# Patient Record
Sex: Male | Born: 1975 | Race: White | Hispanic: No | Marital: Single | Smoking: Never smoker
Health system: Southern US, Community
[De-identification: ages and names within clinical notes are randomized; demographics above are authoritative.]

## PROBLEM LIST (undated history)

## (undated) DIAGNOSIS — F419 Anxiety disorder, unspecified: Secondary | ICD-10-CM

## (undated) DIAGNOSIS — Z9889 Other specified postprocedural states: Secondary | ICD-10-CM

## (undated) DIAGNOSIS — I10 Essential (primary) hypertension: Secondary | ICD-10-CM

## (undated) DIAGNOSIS — M549 Dorsalgia, unspecified: Secondary | ICD-10-CM

## (undated) DIAGNOSIS — E119 Type 2 diabetes mellitus without complications: Secondary | ICD-10-CM

## (undated) DIAGNOSIS — G8929 Other chronic pain: Secondary | ICD-10-CM

## (undated) HISTORY — DX: Other chronic pain: G89.29

## (undated) HISTORY — PX: OTHER SURGICAL HISTORY: SHX169

## (undated) HISTORY — DX: Anxiety disorder, unspecified: F41.9

## (undated) HISTORY — DX: Dorsalgia, unspecified: M54.9

---

## 1998-05-19 ENCOUNTER — Ambulatory Visit (HOSPITAL_COMMUNITY): Admission: RE | Admit: 1998-05-19 | Discharge: 1998-05-19 | Payer: Self-pay | Admitting: Family Medicine

## 1999-09-18 ENCOUNTER — Emergency Department (HOSPITAL_COMMUNITY): Admission: EM | Admit: 1999-09-18 | Discharge: 1999-09-18 | Payer: Self-pay | Admitting: Emergency Medicine

## 1999-09-18 ENCOUNTER — Encounter: Payer: Self-pay | Admitting: Emergency Medicine

## 2002-02-14 ENCOUNTER — Emergency Department (HOSPITAL_COMMUNITY): Admission: EM | Admit: 2002-02-14 | Discharge: 2002-02-15 | Payer: Self-pay

## 2003-11-30 ENCOUNTER — Emergency Department (HOSPITAL_COMMUNITY): Admission: EM | Admit: 2003-11-30 | Discharge: 2003-11-30 | Payer: Self-pay | Admitting: Emergency Medicine

## 2004-06-07 ENCOUNTER — Emergency Department (HOSPITAL_COMMUNITY): Admission: EM | Admit: 2004-06-07 | Discharge: 2004-06-07 | Payer: Self-pay | Admitting: Emergency Medicine

## 2005-01-07 ENCOUNTER — Emergency Department (HOSPITAL_COMMUNITY): Admission: EM | Admit: 2005-01-07 | Discharge: 2005-01-08 | Payer: Self-pay | Admitting: Emergency Medicine

## 2005-01-09 ENCOUNTER — Inpatient Hospital Stay (HOSPITAL_COMMUNITY): Admission: EM | Admit: 2005-01-09 | Discharge: 2005-01-11 | Payer: Self-pay | Admitting: Emergency Medicine

## 2005-01-09 ENCOUNTER — Ambulatory Visit: Payer: Self-pay | Admitting: Internal Medicine

## 2005-01-26 ENCOUNTER — Ambulatory Visit: Payer: Self-pay | Admitting: Internal Medicine

## 2005-11-20 ENCOUNTER — Emergency Department (HOSPITAL_COMMUNITY): Admission: EM | Admit: 2005-11-20 | Discharge: 2005-11-20 | Payer: Self-pay | Admitting: Emergency Medicine

## 2006-06-27 ENCOUNTER — Emergency Department (HOSPITAL_COMMUNITY): Admission: EM | Admit: 2006-06-27 | Discharge: 2006-06-28 | Payer: Self-pay | Admitting: Emergency Medicine

## 2006-09-17 ENCOUNTER — Ambulatory Visit: Payer: Self-pay | Admitting: Internal Medicine

## 2006-09-17 LAB — CONVERTED CEMR LAB
AST: 29 units/L (ref 0–37)
Albumin: 3.9 g/dL (ref 3.5–5.2)
Basophils Relative: 0.9 % (ref 0.0–1.0)
CO2: 26 meq/L (ref 19–32)
Calcium: 9 mg/dL (ref 8.4–10.5)
Cholesterol: 180 mg/dL (ref 0–200)
Eosinophil percent: 3 % (ref 0.0–5.0)
GFR calc non Af Amer: 104 mL/min
Glomerular Filtration Rate, Af Am: 126 mL/min/{1.73_m2}
Glucose, Bld: 100 mg/dL — ABNORMAL HIGH (ref 70–99)
HCT: 48.2 % (ref 39.0–52.0)
HDL: 28.8 mg/dL — ABNORMAL LOW (ref >39.0)
Hemoglobin: 16.7 g/dL (ref 13.0–17.0)
LDL DIRECT: 110.7 mg/dL
Leukocytes, UA: NEGATIVE
Lymphocytes Relative: 34.3 % (ref 12.0–46.0)
Monocytes Absolute: 0.6 10*3/uL (ref 0.2–0.7)
Neutrophils Relative %: 53.8 % (ref 43.0–77.0)
Potassium: 3.9 meq/L (ref 3.5–5.1)
RDW: 11.7 % (ref 11.5–14.6)
Sodium: 141 meq/L (ref 135–145)
Specific Gravity, Urine: >=1.03 (ref 1.000–1.03)
TSH: 0.81 microintl units/mL (ref 0.35–5.50)
Total Bilirubin: 0.8 mg/dL (ref 0.3–1.2)
Total Protein, Urine: NEGATIVE mg/dL
Urine Glucose: NEGATIVE mg/dL
Urobilinogen, UA: 0.2 (ref 0.0–1.0)
VLDL: 43 mg/dL — ABNORMAL HIGH (ref 0–40)
pH: 5.5 (ref 5.0–8.0)

## 2006-09-23 ENCOUNTER — Emergency Department (HOSPITAL_COMMUNITY): Admission: EM | Admit: 2006-09-23 | Discharge: 2006-09-23 | Payer: Self-pay | Admitting: Emergency Medicine

## 2006-09-24 ENCOUNTER — Ambulatory Visit: Payer: Self-pay | Admitting: Internal Medicine

## 2007-05-09 ENCOUNTER — Ambulatory Visit: Payer: Self-pay | Admitting: Internal Medicine

## 2007-05-29 ENCOUNTER — Ambulatory Visit: Payer: Self-pay | Admitting: Internal Medicine

## 2007-09-21 ENCOUNTER — Encounter: Payer: Self-pay | Admitting: Internal Medicine

## 2007-09-26 ENCOUNTER — Telehealth: Payer: Self-pay | Admitting: Internal Medicine

## 2007-12-24 ENCOUNTER — Ambulatory Visit: Payer: Self-pay | Admitting: Internal Medicine

## 2007-12-24 DIAGNOSIS — G8929 Other chronic pain: Secondary | ICD-10-CM | POA: Insufficient documentation

## 2007-12-24 DIAGNOSIS — M549 Dorsalgia, unspecified: Secondary | ICD-10-CM

## 2008-01-01 ENCOUNTER — Telehealth: Payer: Self-pay | Admitting: Internal Medicine

## 2008-03-25 ENCOUNTER — Ambulatory Visit: Payer: Self-pay | Admitting: Internal Medicine

## 2008-03-25 LAB — CONVERTED CEMR LAB
ALT: 60 units/L — ABNORMAL HIGH (ref 0–53)
AST: 42 units/L — ABNORMAL HIGH (ref 0–37)
Basophils Absolute: 0.1 10*3/uL (ref 0.0–0.1)
Basophils Relative: 0.7 % (ref 0.0–1.0)
Bilirubin, Direct: 0.1 mg/dL (ref 0.0–0.3)
Chloride: 111 meq/L (ref 96–112)
Creatinine, Ser: 0.8 mg/dL (ref 0.4–1.5)
Eosinophils Absolute: 0.2 10*3/uL (ref 0.0–0.7)
Glucose, Bld: 119 mg/dL — ABNORMAL HIGH (ref 70–99)
HDL: 29.4 mg/dL — ABNORMAL LOW (ref >39.0)
Hemoglobin, Urine: NEGATIVE
Hemoglobin: 16.1 g/dL (ref 13.0–17.0)
MCV: 88.9 fL (ref 78.0–100.0)
Monocytes Relative: 8.4 % (ref 3.0–12.0)
Neutro Abs: 4.5 10*3/uL (ref 1.4–7.7)
Potassium: 4 meq/L (ref 3.5–5.1)
RBC: 5.14 M/uL (ref 4.22–5.81)
RDW: 12 % (ref 11.5–14.6)
Sodium: 142 meq/L (ref 135–145)
Specific Gravity, Urine: >=1.03 (ref 1.000–1.03)
Total Bilirubin: 0.7 mg/dL (ref 0.3–1.2)
Total CHOL/HDL Ratio: 7.1
Total Protein, Urine: NEGATIVE mg/dL
Total Protein: 7.2 g/dL (ref 6.0–8.3)
Urine Glucose: NEGATIVE mg/dL
VLDL: 43 mg/dL — ABNORMAL HIGH (ref 0–40)
WBC: 8 10*3/uL (ref 4.5–10.5)
pH: 5 (ref 5.0–8.0)

## 2008-04-02 ENCOUNTER — Ambulatory Visit: Payer: Self-pay | Admitting: Internal Medicine

## 2008-04-02 DIAGNOSIS — Z8669 Personal history of other diseases of the nervous system and sense organs: Secondary | ICD-10-CM | POA: Insufficient documentation

## 2008-04-02 DIAGNOSIS — M79609 Pain in unspecified limb: Secondary | ICD-10-CM | POA: Insufficient documentation

## 2008-04-02 DIAGNOSIS — E785 Hyperlipidemia, unspecified: Secondary | ICD-10-CM | POA: Insufficient documentation

## 2008-04-02 DIAGNOSIS — I10 Essential (primary) hypertension: Secondary | ICD-10-CM | POA: Insufficient documentation

## 2008-04-02 DIAGNOSIS — E669 Obesity, unspecified: Secondary | ICD-10-CM | POA: Insufficient documentation

## 2008-04-02 DIAGNOSIS — F17200 Nicotine dependence, unspecified, uncomplicated: Secondary | ICD-10-CM | POA: Insufficient documentation

## 2008-04-29 ENCOUNTER — Ambulatory Visit: Payer: Self-pay | Admitting: Internal Medicine

## 2008-04-29 LAB — CONVERTED CEMR LAB
BUN: 11 mg/dL (ref 6–23)
CO2: 30 meq/L (ref 19–32)
Glucose, Bld: 154 mg/dL — ABNORMAL HIGH (ref 70–99)
Potassium: 3.9 meq/L (ref 3.5–5.1)
Total CHOL/HDL Ratio: 5.8

## 2008-05-04 ENCOUNTER — Telehealth: Payer: Self-pay | Admitting: Internal Medicine

## 2008-05-04 ENCOUNTER — Encounter: Payer: Self-pay | Admitting: Internal Medicine

## 2008-07-31 ENCOUNTER — Telehealth: Payer: Self-pay | Admitting: Internal Medicine

## 2008-08-03 ENCOUNTER — Telehealth: Payer: Self-pay | Admitting: Internal Medicine

## 2008-09-24 ENCOUNTER — Emergency Department (HOSPITAL_COMMUNITY): Admission: EM | Admit: 2008-09-24 | Discharge: 2008-09-25 | Payer: Self-pay | Admitting: Emergency Medicine

## 2008-12-03 ENCOUNTER — Telehealth: Payer: Self-pay | Admitting: Internal Medicine

## 2009-02-03 ENCOUNTER — Telehealth: Payer: Self-pay | Admitting: Internal Medicine

## 2009-04-02 ENCOUNTER — Telehealth: Payer: Self-pay | Admitting: Internal Medicine

## 2009-08-02 ENCOUNTER — Telehealth: Payer: Self-pay | Admitting: Internal Medicine

## 2009-08-03 ENCOUNTER — Telehealth (INDEPENDENT_AMBULATORY_CARE_PROVIDER_SITE_OTHER): Payer: Self-pay | Admitting: *Deleted

## 2009-08-17 ENCOUNTER — Telehealth (INDEPENDENT_AMBULATORY_CARE_PROVIDER_SITE_OTHER): Payer: Self-pay | Admitting: *Deleted

## 2009-08-20 ENCOUNTER — Ambulatory Visit: Payer: Self-pay | Admitting: Internal Medicine

## 2009-08-20 DIAGNOSIS — IMO0002 Reserved for concepts with insufficient information to code with codable children: Secondary | ICD-10-CM | POA: Insufficient documentation

## 2009-08-20 DIAGNOSIS — E1149 Type 2 diabetes mellitus with other diabetic neurological complication: Secondary | ICD-10-CM

## 2009-08-20 DIAGNOSIS — E1165 Type 2 diabetes mellitus with hyperglycemia: Secondary | ICD-10-CM | POA: Insufficient documentation

## 2009-08-20 DIAGNOSIS — E118 Type 2 diabetes mellitus with unspecified complications: Secondary | ICD-10-CM | POA: Insufficient documentation

## 2009-08-20 LAB — CONVERTED CEMR LAB
BUN: 11 mg/dL (ref 6–23)
CO2: 28 meq/L (ref 19–32)
Calcium: 9.6 mg/dL (ref 8.4–10.5)
GFR calc non Af Amer: 116.5 mL/min (ref >60)
Glucose, Bld: 180 mg/dL — ABNORMAL HIGH (ref 70–99)

## 2009-09-30 ENCOUNTER — Telehealth: Payer: Self-pay | Admitting: Internal Medicine

## 2009-12-29 ENCOUNTER — Telehealth: Payer: Self-pay | Admitting: Internal Medicine

## 2009-12-30 ENCOUNTER — Telehealth: Payer: Self-pay | Admitting: Internal Medicine

## 2010-01-04 ENCOUNTER — Ambulatory Visit: Payer: Self-pay | Admitting: Internal Medicine

## 2010-01-04 LAB — CONVERTED CEMR LAB: Blood Glucose, Fingerstick: 234

## 2010-02-16 ENCOUNTER — Telehealth: Payer: Self-pay | Admitting: Internal Medicine

## 2010-04-21 ENCOUNTER — Telehealth: Payer: Self-pay | Admitting: Internal Medicine

## 2010-04-22 ENCOUNTER — Telehealth (INDEPENDENT_AMBULATORY_CARE_PROVIDER_SITE_OTHER): Payer: Self-pay | Admitting: *Deleted

## 2010-05-09 ENCOUNTER — Ambulatory Visit: Payer: Self-pay | Admitting: Internal Medicine

## 2010-05-09 LAB — CONVERTED CEMR LAB
Chloride: 100 meq/L (ref 96–112)
Sodium: 139 meq/L (ref 135–145)
TSH: 1.32 microintl units/mL (ref 0.35–5.50)

## 2010-06-14 ENCOUNTER — Telehealth: Payer: Self-pay | Admitting: Internal Medicine

## 2010-08-22 ENCOUNTER — Telehealth: Payer: Self-pay | Admitting: Internal Medicine

## 2010-11-23 ENCOUNTER — Telehealth: Payer: Self-pay | Admitting: Internal Medicine
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
## 2010-12-22 NOTE — Progress Notes (Signed)
Summary: RF -hydrocodone  Phone Note Refill Request Message from:  Fax from Pharmacy on November 23, 2010 4:54 PM  Refills Requested: Medication #1:  HYDROCODONE-ACETAMINOPHEN 5-500 MG  TABS take 1-2 q 4 hours prn Please advise refill  Initial call taken by: Ami Bullins CMA,  November 23, 2010 4:55 PM  Follow-up for Phone Call        ok for refill: x 3 Follow-up by: Jacques Navy MD,  November 24, 2010 1:00 PM    Prescriptions: HYDROCODONE-ACETAMINOPHEN 5-500 MG  TABS (HYDROCODONE-ACETAMINOPHEN) take 1-2 q 4 hours prn  #180 x 3   Entered by:   Lamar Sprinkles, CMA   Authorized by:   Jacques Navy MD   Signed by:   Lamar Sprinkles, CMA on 11/24/2010   Method used:   Telephoned to ...       [facility] [address] (retail)       [address]       [place], [place]  [address]       Ph: [phone]       Fax: [phone]   RxID:   [id number]
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
## 2010-12-22 NOTE — Progress Notes (Signed)
Summary: REFILL - HYDROCODONE  Phone Note Refill Request Message from:  Pharmacy  Refills Requested: Medication #1:  HYDROCODONE-ACETAMINOPHEN 5-500 MG  TABS take 1-2 q 4 hours prn Initial call taken by: Lamar Sprinkles, CMA,  April 21, 2010 2:31 PM  Follow-up for Phone Call        ok x 2 refills. Will need ov before next round of refills Follow-up by: Jacques Navy MD,  April 21, 2010 4:47 PM    Prescriptions: HYDROCODONE-ACETAMINOPHEN 5-500 MG  TABS (HYDROCODONE-ACETAMINOPHEN) take 1-2 q 4 hours prn  #180 x 1   Entered by:   Lamar Sprinkles, CMA   Authorized by:   Jacques Navy MD   Signed by:   Lamar Sprinkles, CMA on 04/21/2010   Method used:   Telephoned to ...       [facility] [address] (retail)       [address]       [place], [place]  [address]       Ph: [phone]       Fax: [phone]   RxID:   [id number]

## 2010-12-22 NOTE — Progress Notes (Signed)
  Phone Note Call from Patient Call back at Home Phone [phone]   Caller: Patient Call For: Jacques Navy MD Summary of Call: Pt requesting blood pressure medicine, Tenoretic - he took this am but has none left for p.m. Please let pt know. Initial call taken by: Verdell Face,  August 22, 2010 2:51 PM    Prescriptions: ATENOLOL-CHLORTHALIDONE 50-25 MG TABS (ATENOLOL-CHLORTHALIDONE) 1 by mouth once daily  #90 x 1   Entered by:   Rock Nephew CMA   Authorized by:   Jacques Navy MD   Signed by:   Rock Nephew CMA on 08/22/2010   Method used:   Electronically to        [facility] [address] (retail)       [address], [address]       [place], [place]  [address]       Ph: [phone]       Fax: [phone]   RxID:   [id number] ATENOLOL-CHLORTHALIDONE 50-25 MG TABS (ATENOLOL-CHLORTHALIDONE) 1 by mouth once daily  #90 x 1   Entered by:   Lamar Sprinkles, CMA   Authorized by:   Jacques Navy MD   Signed by:   Lamar Sprinkles, CMA on 08/22/2010   Method used:   [facility]        [facility]  [address] [id number]       [address]       [place], [place]  [address]       Ph: [phone] or [phone]       Fax: [phone]   RxID:   [id number]

## 2010-12-22 NOTE — Progress Notes (Signed)
  Phone Note Refill Request   Refills Requested: Medication #1:  DIAZEPAM 5 MG  TABS take 1 by mouth two times a day qd   Last Refilled: 07/03/2010  Medication #2:  HYDROCODONE-ACETAMINOPHEN 5-500 MG  TABS take 1-2 q 4 hours prn Fax from [facility] on [address] in [place]. Please Advise refill  Initial call taken by: Ami Bullins CMA,  August 22, 2010 10:23 AM  Follow-up for Phone Call        OK for refills x 3 monhts Follow-up by: Jacques Navy MD,  August 22, 2010 1:02 PM    Prescriptions: HYDROCODONE-ACETAMINOPHEN 5-500 MG  TABS (HYDROCODONE-ACETAMINOPHEN) take 1-2 q 4 hours prn  #180 x 3   Entered by:   Ami Bullins CMA   Authorized by:   Jacques Navy MD   Signed by:   Bill Salinas CMA on 08/22/2010   Method used:   Telephoned to ...       [facility] [address] (retail)       [address], [address]       [place], [place]  [address]       Ph: [phone]       Fax: [phone]   RxID:   [id number] DIAZEPAM 5 MG  TABS (DIAZEPAM) take 1 by mouth two times a day qd  #60 x 3   Entered by:   Ami Bullins CMA   Authorized by:   Jacques Navy MD   Signed by:   Bill Salinas CMA on 08/22/2010   Method used:   Telephoned to ...       [facility] [address] ([facility]ail)       [address], [address]       [place], [place]  [address]       Ph: [phone]       Fax: [phone]   RxID:   [id number]

## 2010-12-22 NOTE — Progress Notes (Signed)
  Phone Note Refill Request Message from:  Fax from Pharmacy on June 14, 2010 11:44 AM  Refills Requested: Medication #1:  DIAZEPAM 5 MG  TABS take 1 by mouth two times a day qd recieved [facility] from [facility] [address] Please Advise  Initial call taken by: Ami Bullins CMA,  June 14, 2010 11:44 AM  Follow-up for Phone Call        ok x 5 Follow-up by: Jacques Navy MD,  June 14, 2010 12:53 PM    Prescriptions: DIAZEPAM 5 MG  TABS (DIAZEPAM) take 1 by mouth two times a day qd  #60 x 5   Entered by:   Ami Bullins CMA   Authorized by:   Jacques Navy MD   Signed by:   Bill Salinas CMA on 06/15/2010   Method used:   Telephoned to ...       [facility]  [address] [id number]       [address]       [place], [place]  [address]       Ph: [phone] or [phone]       Fax: [phone]   RxID:   [id number]

## 2010-12-22 NOTE — Progress Notes (Signed)
  Phone Note Refill Request Message from:  Fax from Pharmacy on December 29, 2009 10:55 AM  Refills Requested: Medication #1:  HYDROCODONE-ACETAMINOPHEN 5-500 MG  TABS take 1-2 q 4 hours prn   Last Refilled: 07/2009 Last office visit was Aug 20, 2009. Please Advise refill  Initial call taken by: Ami Bullins CMA,  December 29, 2009 10:56 AM  Follow-up for Phone Call        OK to refill with 3 add'l Follow-up by: Jacques Navy MD,  December 29, 2009 11:41 AM    Prescriptions: HYDROCODONE-ACETAMINOPHEN 5-500 MG  TABS (HYDROCODONE-ACETAMINOPHEN) take 1-2 q 4 hours prn  #180 x 3   Entered by:   Ami Bullins CMA   Authorized by:   Jacques Navy MD   Signed by:   Bill Salinas CMA on 12/29/2009   Method used:   [facility]phoned to ...       [facility]  [address] [id number]       [address]       [place], [place]  [address]       Ph: [phone] or [phone]       Fax: [phone]   RxID:   [id number]   Appended Document:  [facility] [address], I canceled Hydrocodone prescription. [facility] [address]

## 2010-12-22 NOTE — Progress Notes (Signed)
----   Converted from flag ---- ---- 04/21/2010 5:36 PM, Lamar Sprinkles, CMA wrote: Patient needs follow up office visit w/norins w/in the next 2 mths Please help  Thanks ------------------------------  Gave pt appt/phone:  05/09/10 @ 250P

## 2010-12-22 NOTE — Progress Notes (Signed)
  Phone Note Refill Request Message from:  Fax from Pharmacy on December 30, 2009 2:38 PM  Refills Requested: Medication #1:  HYDROCODONE-ACETAMINOPHEN 5-500 MG  TABS take 1-2 q 4 hours prn [facility]  Initial call taken by: Ami Bullins CMA,  December 30, 2009 2:39 PM    Prescriptions: HYDROCODONE-ACETAMINOPHEN 5-500 MG  TABS (HYDROCODONE-ACETAMINOPHEN) take 1-2 q 4 hours prn  #180 x 3   Entered by:   Ami Bullins CMA   Authorized by:   Jacques Navy MD   Signed by:   Bill Salinas CMA on 12/30/2009   Method used:   Telephoned to ...       [facility] [address] ([facility]ail)       [address]       [place], [place]  [address]       Ph: [phone]       Fax: [phone]   RxID:   [id number]

## 2010-12-22 NOTE — Progress Notes (Signed)
  Phone Note Refill Request Message from:  Fax from Pharmacy on February 16, 2010 1:50 PM  Refills Requested: Medication #1:  DIAZEPAM 5 MG  TABS take 1 by mouth two times a day qd [facility] [address], Please Advise refill  Initial call taken by: Ami Bullins CMA,  February 16, 2010 1:56 PM  Follow-up for Phone Call        ok to refill x 3 Follow-up by: Jacques Navy MD,  February 16, 2010 2:33 PM    Prescriptions: DIAZEPAM 5 MG  TABS (DIAZEPAM) take 1 by mouth two times a day qd  #60 x 3   Entered by:   Ami Bullins CMA   Authorized by:   Jacques Navy MD   Signed by:   Bill Salinas CMA on 02/16/2010   Method used:   Telephoned to ...       [facility]  [address] [id number]       [address]       [place], [place]  [address]       Ph: [phone] or [phone]       Fax: [phone]   RxID:   [id number]

## 2011-02-01 ENCOUNTER — Telehealth: Payer: Self-pay | Admitting: Internal Medicine

## 2011-02-07 NOTE — Progress Notes (Signed)
  Phone Note Refill Request Message from:  Fax from Pharmacy on February 01, 2011 9:50 AM  Refills Requested: Medication #1:  DIAZEPAM 5 MG  TABS take 1 by mouth two times a day qd   Last Refilled: 01/07/2011 Please Advise refills.   Initial call taken by: Ami Bullins CMA,  February 01, 2011 9:51 AM  Follow-up for Phone Call        ok to refill x 5 Follow-up by: Jacques Navy MD,  February 01, 2011 12:49 PM    Prescriptions: DIAZEPAM 5 MG  TABS (DIAZEPAM) take 1 by mouth two times a day qd  #60 x 5   Entered by:   Ami Bullins CMA   Authorized by:   Jacques Navy MD   Signed by:   Bill Salinas CMA on 02/01/2011   Method used:   [facility]phoned to ...       [facility]  [address] [facility]       [address]       [place], [place]  [address]       Ph: [phone] or [phone]       Fax: [phone]   RxID:   [id number]

## 2011-02-22 ENCOUNTER — Other Ambulatory Visit: Payer: Self-pay | Admitting: Internal Medicine

## 2011-02-27 ENCOUNTER — Other Ambulatory Visit: Payer: Self-pay | Admitting: Internal Medicine

## 2011-02-27 NOTE — Telephone Encounter (Signed)
Ok to refill x 3 

## 2011-02-27 NOTE — Telephone Encounter (Signed)
Please Advise refill 

## 2011-02-27 NOTE — Telephone Encounter (Signed)
Ok for refill x 5 
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
## 2011-07-25 ENCOUNTER — Other Ambulatory Visit: Payer: Self-pay | Admitting: Internal Medicine

## 2011-07-26 ENCOUNTER — Other Ambulatory Visit: Payer: Self-pay | Admitting: Internal Medicine

## 2011-07-26 NOTE — Telephone Encounter (Signed)
Ok for refills. Due for visit

## 2011-07-26 NOTE — Telephone Encounter (Signed)
Please Advise refill for Hydrocodone

## 2011-07-28 ENCOUNTER — Other Ambulatory Visit: Payer: Self-pay | Admitting: Internal Medicine

## 2011-07-28 ENCOUNTER — Other Ambulatory Visit: Payer: Self-pay | Admitting: *Deleted

## 2011-07-28 MED ORDER — METFORMIN HCL 500 MG PO TABS: 500.0000 mg | ORAL_TABLET | Freq: Two times a day (BID) | ORAL | Status: DC

## 2011-07-28 NOTE — Telephone Encounter (Signed)
Please advise regarding RF request.  

## 2011-07-31 ENCOUNTER — Other Ambulatory Visit: Payer: Self-pay | Admitting: *Deleted

## 2011-07-31 NOTE — Telephone Encounter (Signed)
OK for refill x 3 . Coming up on time for office visit

## 2011-08-28 ENCOUNTER — Telehealth: Payer: Self-pay | Admitting: *Deleted

## 2011-08-28 MED ORDER — DIAZEPAM 5 MG PO TABS: 5.0000 mg | ORAL_TABLET | Freq: Three times a day (TID) | ORAL | Status: DC | PRN

## 2011-08-28 NOTE — Telephone Encounter (Signed)
Pt calling requesting refill on Diazepam 5 MG. Please Advise refill

## 2011-08-28 NOTE — Telephone Encounter (Signed)
Ok for 1 month. Needs CPX vs EOV

## 2011-08-30 ENCOUNTER — Other Ambulatory Visit: Payer: Self-pay | Admitting: *Deleted

## 2011-08-30 MED ORDER — DIAZEPAM 5 MG PO TABS: 5.0000 mg | ORAL_TABLET | Freq: Three times a day (TID) | ORAL | Status: DC | PRN

## 2011-10-19 ENCOUNTER — Other Ambulatory Visit: Payer: Self-pay | Admitting: Internal Medicine

## 2011-10-27 ENCOUNTER — Encounter: Payer: Self-pay | Admitting: Internal Medicine

## 2011-10-27 ENCOUNTER — Telehealth: Payer: Self-pay | Admitting: *Deleted

## 2011-10-27 NOTE — Telephone Encounter (Signed)
Rx request for Diazepam 5 mg SIG take one tablet by mouth twice a day QTY 60 fax from [place] on [address] [phone]

## 2011-10-27 NOTE — Telephone Encounter (Signed)
k with 3 refills

## 2011-10-29 NOTE — Telephone Encounter (Signed)
k x 5 

## 2011-10-30 ENCOUNTER — Other Ambulatory Visit: Payer: Self-pay | Admitting: *Deleted

## 2011-10-30 MED ORDER — DIAZEPAM 5 MG PO TABS: 5.0000 mg | ORAL_TABLET | Freq: Three times a day (TID) | ORAL | Status: DC | PRN

## 2011-10-30 NOTE — Progress Notes (Signed)
Diazepam prescription canceled at [facility] pharmacy

## 2011-11-01 ENCOUNTER — Ambulatory Visit (INDEPENDENT_AMBULATORY_CARE_PROVIDER_SITE_OTHER): Payer: Self-pay | Admitting: Internal Medicine

## 2011-11-01 DIAGNOSIS — E669 Obesity, unspecified: Secondary | ICD-10-CM

## 2011-11-01 DIAGNOSIS — F17200 Nicotine dependence, unspecified, uncomplicated: Secondary | ICD-10-CM

## 2011-11-01 DIAGNOSIS — I10 Essential (primary) hypertension: Secondary | ICD-10-CM

## 2011-11-01 DIAGNOSIS — IMO0001 Reserved for inherently not codable concepts without codable children: Secondary | ICD-10-CM

## 2011-11-01 DIAGNOSIS — M79609 Pain in unspecified limb: Secondary | ICD-10-CM

## 2011-11-01 DIAGNOSIS — M549 Dorsalgia, unspecified: Secondary | ICD-10-CM

## 2011-11-01 DIAGNOSIS — E785 Hyperlipidemia, unspecified: Secondary | ICD-10-CM
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
## 2011-11-03 NOTE — Assessment & Plan Note (Signed)
Continues to use oral tobacco products. He is aware of the risks of local disease, e.g. Cancers; made aware of systemic disease - cardiovascular disease related to nicotine. He is pre-contemplative about cessation

## 2011-11-03 NOTE — Assessment & Plan Note (Addendum)
Lab Results  Component Value Date   HGBA1C 5.9 05/09/2010  He continues to take metformin without adverse affects  Plan - f/u A1C with recommendations to follow           Weight management

## 2011-11-03 NOTE — Assessment & Plan Note (Signed)
Remains a source of chronic pain. 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 Can be on his feet for a normal work shift.  Plan - weight management           Flex stretch exercise           analgesics

## 2011-11-03 NOTE — Assessment & Plan Note (Signed)
Continued pain although he has good function.   Plan - analgesics

## 2011-11-03 NOTE — Assessment & Plan Note (Signed)
BP Readings from Last 3 Encounters:  11/01/11 128/72  05/09/10 118/82  01/04/10 136/96   Good control on triple drug therapy: ACE-I, diuretic and beta-blocker.

## 2011-11-03 NOTE — Assessment & Plan Note (Signed)
Lab Results  Component Value Date   CHOL 200 04/29/2008   HDL 34.4* 04/29/2008   LDLDIRECT 107.0 04/29/2008   TRIG 384* 04/29/2008   CHOLHDL 5.8 CALC 04/29/2008   Plan - follow-up lab with recommendations to follow - LDL goal 100 or less

## 2011-11-03 NOTE — Assessment & Plan Note (Signed)
BMI too high!. He has lost 30 lbs over the past 2 years. Emphasized the role his weight plays in all his chronic disease states: HTN, lipids, DM. Advised continued weight management: smart food choices, PORTION SIZE CONTROL, regular aerobic exercise.  Plan - target weight 200 lbs; goal - to loose 1-2 lbs per month

## 2011-11-16 ENCOUNTER — Telehealth: Payer: Self-pay | Admitting: *Deleted

## 2011-11-16 NOTE — Telephone Encounter (Signed)
Requesting refill on hydrocodone, please advise refill

## 2011-11-17 MED ORDER — HYDROCODONE-ACETAMINOPHEN 5-500 MG PO TABS: 1.0000 | ORAL_TABLET | ORAL | Status: DC | PRN

## 2011-11-17 NOTE — Telephone Encounter (Signed)
Ok with refill x 3  

## 2012-02-02 ENCOUNTER — Other Ambulatory Visit: Payer: Self-pay | Admitting: Internal Medicine

## 2012-02-29 ENCOUNTER — Other Ambulatory Visit: Payer: Self-pay | Admitting: Internal Medicine

## 2012-02-29 NOTE — Telephone Encounter (Signed)
Vicodin request [last refill 09.04.12 #[id number]] Please advise.

## 2012-02-29 NOTE — Telephone Encounter (Signed)
Dr. Felicity Coyer is also out of office today. Forwarding to Dr. Posey Rea for approval... 02/29/12@9 ;29am/LMB

## 2012-03-01 ENCOUNTER — Other Ambulatory Visit: Payer: Self-pay | Admitting: *Deleted

## 2012-03-01 MED ORDER — HYDROCODONE-ACETAMINOPHEN 5-500 MG PO TABS: 1.0000 | ORAL_TABLET | ORAL | Status: DC | PRN

## 2012-03-01 NOTE — Progress Notes (Signed)
Ok to fill if time per Dr. Posey Rea. Rf phoned in.

## 2012-03-25 ENCOUNTER — Other Ambulatory Visit: Payer: Self-pay | Admitting: Internal Medicine

## 2012-03-27 ENCOUNTER — Telehealth: Payer: Self-pay | Admitting: *Deleted

## 2012-03-27 DIAGNOSIS — IMO0001 Reserved for inherently not codable concepts without codable children: Secondary | ICD-10-CM

## 2012-03-27 DIAGNOSIS — I10 Essential (primary) hypertension: Secondary | ICD-10-CM

## 2012-03-27 DIAGNOSIS — E785 Hyperlipidemia, unspecified: Secondary | ICD-10-CM

## 2012-03-27 NOTE — Telephone Encounter (Signed)
      Requested Medications     HYDROcodone-acetaminophen (VICODIN) 5-500 MG per tablet [Pharmacy Med Name: HYDROCO/ACETAMINOP 5-500MG  TAB]   TAKE ONE TABLET BY MOUTH EVERY 4 HOURS AS NEEDED FOR PAIN   Disp: 180 each Start: 03/25/2012  Class: Normal   Requested on: 03/01/2012   Originally ordered on: 07/26/2011  Last refill: 03/01/2012

## 2012-03-28 NOTE — Telephone Encounter (Signed)
Patient requesting refill on vicodin . Please advise.  sue

## 2012-03-28 NOTE — Telephone Encounter (Signed)
Ok for one month supply. Needs OV. Will need lab prior to visit - orders entered

## 2012-03-29 NOTE — Telephone Encounter (Signed)
Scheduled pt for ov and aware of labs. Thanks!

## 2012-03-31 ENCOUNTER — Other Ambulatory Visit: Payer: Self-pay | Admitting: Internal Medicine

## 2012-04-01 ENCOUNTER — Other Ambulatory Visit: Payer: Self-pay | Admitting: *Deleted

## 2012-04-01 ENCOUNTER — Other Ambulatory Visit: Payer: Self-pay | Admitting: Internal Medicine

## 2012-04-01 MED ORDER — HYDROCODONE-ACETAMINOPHEN 5-500 MG PO TABS: 1.0000 | ORAL_TABLET | ORAL | Status: DC | PRN

## 2012-04-01 NOTE — Telephone Encounter (Signed)
Rx called to pharmacy. vicodan,

## 2012-04-22 ENCOUNTER — Ambulatory Visit (INDEPENDENT_AMBULATORY_CARE_PROVIDER_SITE_OTHER): Payer: Self-pay | Admitting: Internal Medicine

## 2012-04-22 ENCOUNTER — Other Ambulatory Visit (INDEPENDENT_AMBULATORY_CARE_PROVIDER_SITE_OTHER): Payer: Self-pay

## 2012-04-22 ENCOUNTER — Encounter: Payer: Self-pay | Admitting: Internal Medicine

## 2012-04-22 VITALS — BP 134/94 | HR 75 | Temp 98.6°F | Resp 16 | Wt 274.0 lb

## 2012-04-22 DIAGNOSIS — IMO0001 Reserved for inherently not codable concepts without codable children: Secondary | ICD-10-CM

## 2012-04-22 DIAGNOSIS — F419 Anxiety disorder, unspecified: Secondary | ICD-10-CM

## 2012-04-22 DIAGNOSIS — M79609 Pain in unspecified limb: Secondary | ICD-10-CM

## 2012-04-22 DIAGNOSIS — E669 Obesity, unspecified: Secondary | ICD-10-CM

## 2012-04-22 DIAGNOSIS — F411 Generalized anxiety disorder: Secondary | ICD-10-CM

## 2012-04-22 DIAGNOSIS — I10 Essential (primary) hypertension: Secondary | ICD-10-CM

## 2012-04-22 DIAGNOSIS — M549 Dorsalgia, unspecified: Secondary | ICD-10-CM

## 2012-04-22 DIAGNOSIS — E785 Hyperlipidemia, unspecified: Secondary | ICD-10-CM

## 2012-04-22 LAB — LIPID PANEL
Total CHOL/HDL Ratio: 4
Triglycerides: 320 mg/dL — ABNORMAL HIGH (ref 0.0–149.0)
VLDL: 64 mg/dL — ABNORMAL HIGH (ref 0.0–40.0)

## 2012-04-22 LAB — COMPREHENSIVE METABOLIC PANEL
ALT: 47 U/L (ref 0–53)
AST: 32 U/L (ref 0–37)
Alkaline Phosphatase: 55 U/L (ref 39–117)
CO2: 27 mEq/L (ref 19–32)
GFR: 111.58 mL/min (ref >60.00)
Sodium: 140 mEq/L (ref 135–145)
Total Bilirubin: 0.6 mg/dL (ref 0.3–1.2)
Total Protein: 7.2 g/dL (ref 6.0–8.3)

## 2012-04-22 MED ORDER — ATENOLOL-CHLORTHALIDONE 50-25 MG PO TABS: 1.0000 | ORAL_TABLET | Freq: Every day | ORAL | Status: DC

## 2012-04-22 MED ORDER — DIAZEPAM 5 MG PO TABS: 5.0000 mg | ORAL_TABLET | Freq: Three times a day (TID) | ORAL | Status: DC | PRN

## 2012-04-22 MED ORDER — SERTRALINE HCL 50 MG PO TABS: 50.0000 mg | ORAL_TABLET | Freq: Every day | ORAL | Status: DC

## 2012-04-22 MED ORDER — HYDROCODONE-ACETAMINOPHEN 5-500 MG PO TABS: 1.0000 | ORAL_TABLET | ORAL | Status: DC | PRN

## 2012-04-22 NOTE — Patient Instructions (Signed)
For irritability and probable anxiety will have you take sertraline 50 mg once a day. You can continue the valium for muscle spasm.  Renewed all your medications.  Blood pressure is OK - continue present medications  You need to go to lab today.
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
## 2012-04-23 ENCOUNTER — Encounter: Payer: Self-pay | Admitting: Internal Medicine

## 2012-04-23 ENCOUNTER — Encounter: Payer: Self-pay | Admitting: *Deleted

## 2012-04-23 DIAGNOSIS — F419 Anxiety disorder, unspecified: Secondary | ICD-10-CM | POA: Insufficient documentation

## 2012-04-23 DIAGNOSIS — M25569 Pain in unspecified knee: Secondary | ICD-10-CM

## 2012-04-23 NOTE — Assessment & Plan Note (Signed)
Patient reports irritability which he says he is aware of but has been difficult to control. This is likely related to anxiety more than to depression.  Plan SSRI therapy with sertraline 50 mg daily

## 2012-04-23 NOTE — Assessment & Plan Note (Signed)
Lab Results  Component Value Date   HGBA1C 6.1 04/22/2012   Excellent control on metformin as single agent  Plan Continue present medications

## 2012-04-23 NOTE — Assessment & Plan Note (Signed)
LDL is better than goal of 100 or less on diet mgt alone.  Plan Continue control with diet and some exercise (needs to do much more).

## 2012-04-23 NOTE — Assessment & Plan Note (Signed)
No change in condition. He continues to use valium for muscle spasm and hydrocodone for pain mgt.

## 2012-04-23 NOTE — Assessment & Plan Note (Signed)
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 He is aware of the importance of weight mgt in regard to the metabolic syndrome.  Plan Will discuss the possibility of bariatric surgery, however he is uninsured which will make this, most likely, impossible to afford.

## 2012-04-23 NOTE — Assessment & Plan Note (Signed)
BP Readings from Last 3 Encounters:  04/22/12 134/94  11/01/11 128/72  05/09/10 118/82   Very good control on ACE-I, BB and diuretic. Labs are OK  Plan - continue present medical regimen  Work on weight mgt and reduction.

## 2012-04-23 NOTE — Assessment & Plan Note (Signed)
No change in condition. He has decreased ROM right arm, especially with flexion and adduction  Plan - continue pain mgt regimen

## 2012-08-05 ENCOUNTER — Other Ambulatory Visit: Payer: Self-pay | Admitting: Internal Medicine

## 2012-08-07 ENCOUNTER — Other Ambulatory Visit: Payer: Self-pay | Admitting: Internal Medicine

## 2012-08-07 ENCOUNTER — Telehealth: Payer: Self-pay | Admitting: Internal Medicine

## 2012-08-07 NOTE — Telephone Encounter (Signed)
Rx printed for refill on vicodan

## 2012-08-07 NOTE — Telephone Encounter (Signed)
Patient needs a refill on Vicodin, says the pharmacy has sent a request but they have not heard anything

## 2012-08-08 NOTE — Telephone Encounter (Signed)
k for rfill

## 2012-09-03 ENCOUNTER — Other Ambulatory Visit: Payer: Self-pay | Admitting: Internal Medicine

## 2012-09-18 ENCOUNTER — Telehealth: Payer: Self-pay | Admitting: *Deleted

## 2012-09-18 ENCOUNTER — Other Ambulatory Visit: Payer: Self-pay | Admitting: Internal Medicine

## 2012-09-18 NOTE — Telephone Encounter (Signed)
Hard copy faxed to [facility] pharmacy

## 2012-10-01 ENCOUNTER — Other Ambulatory Visit: Payer: Self-pay | Admitting: Internal Medicine

## 2012-10-01 ENCOUNTER — Other Ambulatory Visit: Payer: Self-pay | Admitting: *Deleted

## 2012-10-01 NOTE — Telephone Encounter (Signed)
Medication refill for hydrocodone acetaminophen faxed to pharmacy.

## 2012-10-14 ENCOUNTER — Emergency Department (HOSPITAL_COMMUNITY): Payer: Self-pay

## 2012-10-14 ENCOUNTER — Encounter (HOSPITAL_COMMUNITY): Payer: Self-pay | Admitting: Emergency Medicine

## 2012-10-14 ENCOUNTER — Emergency Department (HOSPITAL_COMMUNITY)
Admission: EM | Admit: 2012-10-14 | Discharge: 2012-10-14 | Disposition: A | Discharge status: Home / Self Care | Payer: Self-pay | Attending: Emergency Medicine | Admitting: Emergency Medicine

## 2012-10-14 DIAGNOSIS — IMO0002 Reserved for concepts with insufficient information to code with codable children: Secondary | ICD-10-CM | POA: Insufficient documentation

## 2012-10-14 DIAGNOSIS — Y929 Unspecified place or not applicable: Secondary | ICD-10-CM | POA: Insufficient documentation

## 2012-10-14 DIAGNOSIS — X58XXXA Exposure to other specified factors, initial encounter: Secondary | ICD-10-CM | POA: Insufficient documentation

## 2012-10-14 DIAGNOSIS — I10 Essential (primary) hypertension: Secondary | ICD-10-CM | POA: Insufficient documentation

## 2012-10-14 DIAGNOSIS — S8390XA Sprain of unspecified site of unspecified knee, initial encounter: Secondary | ICD-10-CM

## 2012-10-14 DIAGNOSIS — Y939 Activity, unspecified: Secondary | ICD-10-CM | POA: Insufficient documentation

## 2012-10-14 DIAGNOSIS — E119 Type 2 diabetes mellitus without complications: Secondary | ICD-10-CM | POA: Insufficient documentation

## 2012-10-14 DIAGNOSIS — Z79899 Other long term (current) drug therapy: Secondary | ICD-10-CM | POA: Insufficient documentation

## 2012-10-14 HISTORY — DX: Other specified postprocedural states: Z98.890

## 2012-10-14 HISTORY — DX: Essential (primary) hypertension: I10

## 2012-10-14 HISTORY — DX: Type 2 diabetes mellitus without complications: E11.9

## 2012-10-14 MED ORDER — OXYCODONE-ACETAMINOPHEN 5-325 MG PO TABS: ORAL_TABLET | ORAL | Status: DC

## 2012-10-14 MED ORDER — OXYCODONE-ACETAMINOPHEN 5-325 MG PO TABS
2.0000 | ORAL_TABLET | Freq: Once | ORAL | Status: AC
  Administered 2012-10-14: 2 via ORAL
  Filled 2012-10-14: qty 2

## 2012-10-14 MED ORDER — OXYCODONE-ACETAMINOPHEN 5-325 MG PO TABS
1.0000 | ORAL_TABLET | Freq: Once | ORAL | Status: AC
  Administered 2012-10-14: 1 via ORAL
  Filled 2012-10-14: qty 1

## 2012-10-14 MED ORDER — KETOROLAC TROMETHAMINE 30 MG/ML IJ SOLN
30.0000 mg | Freq: Once | INTRAMUSCULAR | Status: AC
  Administered 2012-10-14: 30 mg via INTRAVENOUS
  Filled 2012-10-14: qty 1

## 2012-10-14 NOTE — ED Notes (Signed)
Toradol was given IM.

## 2012-10-14 NOTE — Progress Notes (Signed)
Orthopedic Tech Progress Note Patient Details:  John Rhodes [birth year] [id number]  Ortho Devices Type of Ortho Device: Knee Sleeve Ortho Device/Splint Location: knee support  Ortho Device/Splint Interventions: Application   Cammer, Mickie Bail 10/14/2012, 1:44 PM

## 2012-10-14 NOTE — ED Notes (Addendum)
Has had left knee pain on and off x 2 years. Started 2 days ago with increasing pain ,redness, swelling and warm to touch. Pt is diabetic. No meds taken this am. Usually takes Hydrocodone but has not taken today.

## 2012-10-14 NOTE — ED Provider Notes (Signed)
History    This chart was scribed for John Rhodes. Oletta Lamas, MD, MD by Smitty Pluck, ED Scribe. The patient was seen in room TR07C and the patient's care was started at 11:54AM.   CSN: [id number]  Arrival date & time 10/14/12  1137   None     Chief Complaint  Patient presents with  . Knee Pain    (Consider location/radiation/quality/duration/timing/severity/associated sxs/prior treatment) The history is provided by the patient. No language interpreter was used.   John Rhodes is a 36 y.o. male with hx of HTN and diabetes who presents to the Emergency Department complaining of constant, moderate left knee pain onset 3 days ago. Pt reports that he injured his knee 2 years ago. He denies having surgery on knee and symptoms improved. He reports that he has felt warmth in left knee. Movement aggravates the knee pain. He denies recent injury, fever, chills, nausea, vomiting and any other pain.    Past Medical History  Diagnosis Date  . Diabetes mellitus without complication   . Hypertension   . History of thumb surgery     Past Surgical History  Procedure Date  . Arm surgery     History reviewed. No pertinent family history.  History  Substance Use Topics  . Smoking status: Never Smoker   . Smokeless tobacco: Current User    Types: Chew  . Alcohol Use: Yes      Review of Systems  Constitutional: Negative for fever and chills.  Respiratory: Negative for shortness of breath.   Gastrointestinal: Negative for nausea and vomiting.  Musculoskeletal: Positive for arthralgias.  Neurological: Negative for weakness.    Allergies  Penicillins  Home Medications   Current Outpatient Rx  Name  Route  Sig  Dispense  Refill  . ATENOLOL-CHLORTHALIDONE 50-25 MG PO TABS   Oral   Take 1 tablet by mouth daily.   30 tablet   11   . DIAZEPAM 5 MG PO TABS   Oral   Take 5 mg by mouth every 8 (eight) hours as needed. For anxiety         . ENALAPRIL MALEATE 10 MG PO TABS   Oral   Take 10 mg by mouth daily.         Marland Kitchen HYDROCODONE-ACETAMINOPHEN 5-500 MG PO TABS   Oral   Take 1 tablet by mouth every 6 (six) hours as needed. For pain         . METFORMIN HCL 500 MG PO TABS   Oral   Take 500 mg by mouth 2 (two) times daily with a meal.         . SERTRALINE HCL 50 MG PO TABS   Oral   Take 1 tablet (50 mg total) by mouth daily.   30 tablet   12   . OXYCODONE-ACETAMINOPHEN 5-325 MG PO TABS      1-2 tablets po q 6 hours prn moderate to severe pain   20 tablet   0     BP 142/85  Pulse 102  Temp 98.5 F (36.9 C) (Oral)  Resp 16  SpO2 96%  Physical Exam  Nursing note and vitals reviewed. Constitutional: He is oriented to person, place, and time. He appears well-developed and well-nourished. No distress.  HENT:  Head: Normocephalic and atraumatic.  Eyes: EOM are normal.  Neck: Neck supple. No tracheal deviation present.  Pulmonary/Chest: Effort normal. No respiratory distress.  Musculoskeletal: Normal range of motion. He exhibits tenderness.  Skin color of left knee and leg is nl  DP pulse in left foot is +2   Neurological: He is alert and oriented to person, place, and time.       Nl sensation of right leg    Skin: Skin is warm and dry.  Psychiatric: He has a normal mood and affect. His behavior is normal.    ED Course  Procedures (including critical care time) DIAGNOSTIC STUDIES: Oxygen Saturation is 96% on room air, adequate by my interpretation.    COORDINATION OF CARE: 11:58 AM Discussed ED treatment with pt  12:01 PM Ordered:     . [COMPLETED] ketorolac  30 mg Intravenous Once  . [COMPLETED] oxyCODONE-acetaminophen  2 tablet Oral Once       Labs Reviewed - No data to display Dg Knee Complete 4 Views Left  10/14/2012  *RADIOLOGY REPORT*  Clinical Data: Anterior left knee pain.  LEFT KNEE - COMPLETE 4+ VIEW  Comparison: 09/24/2008  Findings: Four views of the left knee were obtained.  Left knee is located without acute  fracture or dislocation.  Again noted are prominent ossifications along the tibial tuberosity.  These findings are unchanged and may represent old injury or old Osgood- Schlatter disease.  IMPRESSION: No acute bony abnormality in the left knee.  Chronic irregularity along the tibial tuberosity may represent old Osgood-Schlatter disease.   Original Report Authenticated By: Richarda Overlie, M.D.      1. Knee sprain       MDM  I personally performed the services described in this documentation, which was scribed in my presence. The recorded information has been reviewed and is accurate.  Pt with no trauma, no fever, normal distal sensation, pain with movement.  Possibly arthritis exacerbation.  Neg Homan's, no history concerning for DVT.  Plain films neg per radiologist.  I reviewed films myself.  Percocet helped pain some here.  RICE at home.  Pt can follow up with PCP Dr. [name].     John Rhodes. Evely Gainey, MD 10/14/12 1328

## 2012-10-14 NOTE — Discharge Instructions (Signed)
 Knee Pain The knee is the complex joint between your thigh and your lower leg. It is made up of bones, tendons, ligaments, and cartilage. The bones that make up the knee are:  The femur in the thigh.  The tibia and fibula in the lower leg.  The patella or kneecap riding in the groove on the lower femur. CAUSES  Knee pain is a common complaint with many causes. A few of these causes are:  Injury, such as:  A ruptured ligament or tendon injury.  Torn cartilage.  Medical conditions, such as:  Gout  Arthritis  Infections  Overuse, over training or overdoing a physical activity. Knee pain can be minor or severe. Knee pain can accompany debilitating injury. Minor knee problems often respond well to self-care measures or get well on their own. More serious injuries may need medical intervention or even surgery. SYMPTOMS The knee is complex. Symptoms of knee problems can vary widely. Some of the problems are:  Pain with movement and weight bearing.  Swelling and tenderness.  Buckling of the knee.  Inability to straighten or extend your knee.  Your knee locks and you cannot straighten it.  Warmth and redness with pain and fever.  Deformity or dislocation of the kneecap. DIAGNOSIS  Determining what is wrong may be very straight forward such as when there is an injury. It can also be challenging because of the complexity of the knee. Tests to make a diagnosis may include:  Your caregiver taking a history and doing a physical exam.  Routine X-rays can be used to rule out other problems. X-rays will not reveal a cartilage tear. Some injuries of the knee can be diagnosed by:  Arthroscopy a surgical technique by which a small video camera is inserted through tiny incisions on the sides of the knee. This procedure is used to examine and repair internal knee joint problems. Tiny instruments can be used during arthroscopy to repair the torn knee cartilage (meniscus).  Arthrography  is a radiology technique. A contrast liquid is directly injected into the knee joint. Internal structures of the knee joint then become visible on X-ray film.  An MRI scan is a non x-ray radiology procedure in which magnetic fields and a computer produce two- or three-dimensional images of the inside of the knee. Cartilage tears are often visible using an MRI scanner. MRI scans have largely replaced arthrography in diagnosing cartilage tears of the knee.  Blood work.  Examination of the fluid that helps to lubricate the knee joint (synovial fluid). This is done by taking a sample out using a needle and a syringe. TREATMENT The treatment of knee problems depends on the cause. Some of these treatments are:  Depending on the injury, proper casting, splinting, surgery or physical therapy care will be needed.  Give yourself adequate recovery time. Do not overuse your joints. If you begin to get sore during workout routines, back off. Slow down or do fewer repetitions.  For repetitive activities such as cycling or running, maintain your strength and nutrition.  Alternate muscle groups. For example if you are a weight lifter, work the upper body on one day and the lower body the next.  Either tight or weak muscles do not give the proper support for your knee. Tight or weak muscles do not absorb the stress placed on the knee joint. Keep the muscles surrounding the knee strong.  Take care of mechanical problems.  If you have flat feet, orthotics or special shoes may help.  See your caregiver if you need help.  Arch supports, sometimes with wedges on the inner or outer aspect of the heel, can help. These can shift pressure away from the side of the knee most bothered by osteoarthritis.  A brace called an unloader brace also may be used to help ease the pressure on the most arthritic side of the knee.  If your caregiver has prescribed crutches, braces, wraps or ice, use as directed. The acronym for  this is PRICE. This means protection, rest, ice, compression and elevation.  Nonsteroidal anti-inflammatory drugs (NSAID's), can help relieve pain. But if taken immediately after an injury, they may actually increase swelling. Take NSAID's with food in your stomach. Stop them if you develop stomach problems. Do not take these if you have a history of ulcers, stomach pain or bleeding from the bowel. Do not take without your caregiver's approval if you have problems with fluid retention, heart failure, or kidney problems.  For ongoing knee problems, physical therapy may be helpful.  Glucosamine and chondroitin are over-the-counter dietary supplements. Both may help relieve the pain of osteoarthritis in the knee. These medicines are different from the usual anti-inflammatory drugs. Glucosamine may decrease the rate of cartilage destruction.  Injections of a corticosteroid drug into your knee joint may help reduce the symptoms of an arthritis flare-up. They may provide pain relief that lasts a few months. You may have to wait a few months between injections. The injections do have a small increased risk of infection, water retention and elevated blood sugar levels.  Hyaluronic acid injected into damaged joints may ease pain and provide lubrication. These injections may work by reducing inflammation. A series of shots may give relief for as long as 6 months.  Topical painkillers. Applying certain ointments to your skin may help relieve the pain and stiffness of osteoarthritis. Ask your pharmacist for suggestions. Many over the-counter products are approved for temporary relief of arthritis pain.  In some countries, doctors often prescribe topical NSAID's for relief of chronic conditions such as arthritis and tendinitis. A review of treatment with NSAID creams found that they worked as well as oral medications but without the serious side effects. PREVENTION  Maintain a healthy weight. Extra pounds put  more strain on your joints.  Get strong, stay limber. Weak muscles are a common cause of knee injuries. Stretching is important. Include flexibility exercises in your workouts.  Be smart about exercise. If you have osteoarthritis, chronic knee pain or recurring injuries, you may need to change the way you exercise. This does not mean you have to stop being active. If your knees ache after jogging or playing basketball, consider switching to swimming, water aerobics or other low-impact activities, at least for a few days a week. Sometimes limiting high-impact activities will provide relief.  Make sure your shoes fit well. Choose footwear that is right for your sport.  Protect your knees. Use the proper gear for knee-sensitive activities. Use kneepads when playing volleyball or laying carpet. Buckle your seat belt every time you drive. Most shattered kneecaps occur in car accidents.  Rest when you are tired. SEEK MEDICAL CARE IF:  You have knee pain that is continual and does not seem to be getting better.  SEEK IMMEDIATE MEDICAL CARE IF:  Your knee joint feels hot to the touch and you have a high fever. MAKE SURE YOU:   Understand these instructions.  Will watch your condition.  Will get help right away if you are not  doing well or get worse. Document Released: 09/03/2007 Document Revised: 01/29/2012 Document Reviewed: 09/03/2007 [facility] Patient Information 2013 [place], [place].    Narcotic and benzodiazepine use may cause drowsiness, slowed breathing or dependence.  Please use with caution and do not drive, operate machinery or watch young children alone while taking them.  Taking combinations of these medications or drinking alcohol will potentiate these effects.

## 2012-10-19 ENCOUNTER — Other Ambulatory Visit: Payer: Self-pay | Admitting: Internal Medicine

## 2012-10-21 ENCOUNTER — Encounter: Payer: Self-pay | Admitting: Internal Medicine

## 2012-10-21 ENCOUNTER — Ambulatory Visit (INDEPENDENT_AMBULATORY_CARE_PROVIDER_SITE_OTHER): Payer: Self-pay | Admitting: Internal Medicine

## 2012-10-21 VITALS — BP 124/84 | HR 82 | Temp 97.5°F | Resp 12 | Wt 271.0 lb

## 2012-10-21 DIAGNOSIS — M25562 Pain in left knee: Secondary | ICD-10-CM

## 2012-10-21 DIAGNOSIS — M25569 Pain in unspecified knee: Secondary | ICD-10-CM

## 2012-10-21 MED ORDER — METHYLPREDNISOLONE ACETATE 80 MG/ML IJ SUSP: 80.0000 mg | Freq: Once | INTRAMUSCULAR | Status: DC

## 2012-10-21 MED ADMIN — Methylprednisolone Acetate Inj Susp 80 MG/ML: 80 mg | INTRA_ARTICULAR

## 2012-10-21 NOTE — Patient Instructions (Addendum)
Knee pain - x-ray with no joint damage. Suspect acute osteoarthritic flare with the swelling and pain vs acute tendonitis. The injection of steroid into the joint space will help any arthritic pain. For the possibility of tendonitis apply rub of choice, aspercreme or capsacin cream, to the sore place 4 times a day.  Knee Pain The knee is the complex joint between your thigh and your lower leg. It is made up of bones, tendons, ligaments, and cartilage. The bones that make up the knee are:  The femur in the thigh.   The tibia and fibula in the lower leg.   The patella or kneecap riding in the groove on the lower femur.  CAUSES   Knee pain is a common complaint with many causes. A few of these causes are:  Injury, such as:   A ruptured ligament or tendon injury.   Torn cartilage.   Medical conditions, such as:   Gout   Arthritis   Infections   Overuse, over training or overdoing a physical activity.  Knee pain can be minor or severe. Knee pain can accompany debilitating injury. Minor knee problems often respond well to self-care measures or get well on their own. More serious injuries may need medical intervention or even surgery. SYMPTOMS The knee is complex. Symptoms of knee problems can vary widely. Some of the problems are:  Pain with movement and weight bearing.   Swelling and tenderness.   Buckling of the knee.   Inability to straighten or extend your knee.   Your knee locks and you cannot straighten it.   Warmth and redness with pain and fever.   Deformity or dislocation of the kneecap.  DIAGNOSIS   Determining what is wrong may be very straight forward such as when there is an injury. It can also be challenging because of the complexity of the knee. Tests to make a diagnosis may include:  Your caregiver taking a history and doing a physical exam.   Routine X-rays can be used to rule out other problems. X-rays will not reveal a cartilage tear. Some injuries of  the knee can be diagnosed by:   Arthroscopy a surgical technique by which a small video camera is inserted through tiny incisions on the sides of the knee. This procedure is used to examine and repair internal knee joint problems. Tiny instruments can be used during arthroscopy to repair the torn knee cartilage (meniscus).   Arthrography is a radiology technique. A contrast liquid is directly injected into the knee joint. Internal structures of the knee joint then become visible on X-ray film.   An MRI scan is a non x-ray radiology procedure in which magnetic fields and a computer produce two- or three-dimensional images of the inside of the knee. Cartilage tears are often visible using an MRI scanner. MRI scans have largely replaced arthrography in diagnosing cartilage tears of the knee.   Blood work.   Examination of the fluid that helps to lubricate the knee joint (synovial fluid). This is done by taking a sample out using a needle and a syringe.  TREATMENT The treatment of knee problems depends on the cause. Some of these treatments are:  Depending on the injury, proper casting, splinting, surgery or physical therapy care will be needed.   Give yourself adequate recovery time. Do not overuse your joints. If you begin to get sore during workout routines, back off. Slow down or do fewer repetitions.   For repetitive activities such as cycling or running,  maintain your strength and nutrition.   Alternate muscle groups. For example if you are a weight lifter, work the upper body on one day and the lower body the next.   Either tight or weak muscles do not give the proper support for your knee. Tight or weak muscles do not absorb the stress placed on the knee joint. Keep the muscles surrounding the knee strong.   Take care of mechanical problems.   If you have flat feet, orthotics or special shoes may help. See your caregiver if you need help.   Arch supports, sometimes with wedges on the  inner or outer aspect of the heel, can help. These can shift pressure away from the side of the knee most bothered by osteoarthritis.   A brace called an "unloader" brace also may be used to help ease the pressure on the most arthritic side of the knee.   If your caregiver has prescribed crutches, braces, wraps or ice, use as directed. The acronym for this is PRICE. This means protection, rest, ice, compression and elevation.   Nonsteroidal anti-inflammatory drugs (NSAID's), can help relieve pain. But if taken immediately after an injury, they may actually increase swelling. Take NSAID's with food in your stomach. Stop them if you develop stomach problems. Do not take these if you have a history of ulcers, stomach pain or bleeding from the bowel. Do not take without your caregiver's approval if you have problems with fluid retention, heart failure, or kidney problems.   For ongoing knee problems, physical therapy may be helpful.   Glucosamine and chondroitin are over-the-counter dietary supplements. Both may help relieve the pain of osteoarthritis in the knee. These medicines are different from the usual anti-inflammatory drugs. Glucosamine may decrease the rate of cartilage destruction.   Injections of a corticosteroid drug into your knee joint may help reduce the symptoms of an arthritis flare-up. They may provide pain relief that lasts a few months. You may have to wait a few months between injections. The injections do have a small increased risk of infection, water retention and elevated blood sugar levels.   Hyaluronic acid injected into damaged joints may ease pain and provide lubrication. These injections may work by reducing inflammation. A series of shots may give relief for as long as 6 months.   Topical painkillers. Applying certain ointments to your skin may help relieve the pain and stiffness of osteoarthritis. Ask your pharmacist for suggestions. Many over the-counter products are  approved for temporary relief of arthritis pain.   In some countries, doctors often prescribe topical NSAID's for relief of chronic conditions such as arthritis and tendinitis. A review of treatment with NSAID creams found that they worked as well as oral medications but without the serious side effects.  PREVENTION  Maintain a healthy weight. Extra pounds put more strain on your joints.   Get strong, stay limber. Weak muscles are a common cause of knee injuries. Stretching is important. Include flexibility exercises in your workouts.   Be smart about exercise. If you have osteoarthritis, chronic knee pain or recurring injuries, you may need to change the way you exercise. This does not mean you have to stop being active. If your knees ache after jogging or playing basketball, consider switching to swimming, water aerobics or other low-impact activities, at least for a few days a week. Sometimes limiting high-impact activities will provide relief.   Make sure your shoes fit well. Choose footwear that is right for your sport.  Protect your knees. Use the proper gear for knee-sensitive activities. Use kneepads when playing volleyball or laying carpet. Buckle your seat belt every time you drive. Most shattered kneecaps occur in car accidents.   Rest when you are tired.  SEEK MEDICAL CARE IF:   You have knee pain that is continual and does not seem to be getting better.   SEEK IMMEDIATE MEDICAL CARE IF:   Your knee joint feels hot to the touch and you have a high fever. MAKE SURE YOU:    Understand these instructions.   Will watch your condition.   Will get help right away if you are not doing well or get worse.  Document Released: 09/03/2007 Document Revised: 01/29/2012 Document Reviewed: 09/03/2007 [facility] Patient Information 2013 [place], [place].

## 2012-10-21 NOTE — Progress Notes (Signed)
Subjective:    Patient ID: John Rhodes, male    DOB: 05-13-1976, 36 y.o.   MRN: [id number]  HPI John Rhodes reports that Nov 25 he awoke with a swollen and painful left knee. He has had knee pain on two previous occasions but the pain was limited. He was seen at [facility] ED - records reviewed: he had an unremarkable exam and knee films that were negative. He was given Rx for percocet and sent home. He continues to have knee pain, especially with straightening and weight bearing.  Past Medical History  Diagnosis Date  . Diabetes mellitus without complication   . Hypertension   . History of thumb surgery    Past Surgical History  Procedure Date  . Arm surgery    History reviewed. No pertinent family history. History   Social History  . Marital Status: Single    Spouse Name: N/A    Number of Children: 0  . Years of Education: 12   Occupational History  . Not on file.   Social History Main Topics  . Smoking status: Never Smoker   . Smokeless tobacco: Current User    Types: Chew  . Alcohol Use: Yes  . Drug Use: No  . Sexually Active: Yes -- Male partner(s)   Other Topics Concern  . Not on file   Social History Narrative      HSG. 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 atenolol-chlorthalidone (TENORETIC) 50-25 MG per tablet Take 1 tablet by mouth daily.  30 tablet  11  . diazepam (VALIUM) 5 MG tablet Take 5 mg by mouth every 8 (eight) hours as needed. For anxiety      . enalapril (VASOTEC) 10 MG tablet TAKE ONE TABLET BY MOUTH EVERY DAY  30 tablet  6  . HYDROcodone-acetaminophen (VICODIN) 5-500 MG per tablet Take 1 tablet by mouth every 6 (six) hours as needed. For pain      . metFORMIN (GLUCOPHAGE) 500 MG tablet Take 500 mg by mouth 2 (two)  times daily with a meal.      . oxyCODONE-acetaminophen (PERCOCET/ROXICET) 5-325 MG per tablet 1-2 tablets po q 6 hours prn moderate to severe pain  20 tablet  0  . sertraline (ZOLOFT) 50 MG tablet Take 1 tablet (50 mg total) by mouth daily.  30 tablet  12      Review of Systems System review is negative for any constitutional, cardiac, pulmonary, GI or neuro symptoms or complaints other than as described in the HPI.     Objective:   Physical Exam Filed Vitals:   10/21/12 1302  BP: 124/84  Pulse: 82  Temp: 97.5 F (36.4 C)  Resp: 12   Wt Readings from Last 3 Encounters:  10/21/12 271 lb (122.925 kg)  04/22/12 274 lb (124.286 kg)  11/01/11 269 lb (122.018 kg)   Gen'l- obese white man in no acute distress Cor- RRR Pulm - normal respirations MAK - left knee w/o effusion, tender along the joint line and tender at medial aspect at tendonous attachment  Procedure Joint injection left knee  Indication - localized pain Consent - informed verbal consent from patient after explanation of risks of bleeding and infection Prep - injection site identified, prepped with betadine followed by alcohol. Med -  80 Mg depomedrol with   1 Cc 2% xylocain w/ epi Injection - bursa/joint space entered easily. Injected without difficulty. Patient tolerated this well. Post-procedure - patient with rapid reduction in discomfort. Bandaid applied. Routine precautions provided including instruction to return for fever, drainage or increased pain        Assessment & Plan:  Knee pain - acute flare of OA vs tendonitis. He had rapid improvement following injection.  Plan No limitation in activity  Use rub of choice medial knee

## 2012-10-22 ENCOUNTER — Other Ambulatory Visit: Payer: Self-pay | Admitting: *Deleted

## 2012-10-23 NOTE — Progress Notes (Signed)
Injection was given in left knee and not right knee.

## 2012-10-29 ENCOUNTER — Telehealth: Payer: Self-pay | Admitting: *Deleted

## 2012-10-29 NOTE — Telephone Encounter (Signed)
Pls ask Dr [name] Thx

## 2012-10-29 NOTE — Telephone Encounter (Signed)
Pt is requesting refill of Hydrocone-APAP 5-325mg  since they no longer make the Hydrocodone-APAP 5-500 that he was prescribed before. Pt states that he recently saw Dr. [name] on 10/21/2012 for his knee pain and is needing refill for it. Please advise in MEN's absence.

## 2012-10-30 ENCOUNTER — Telehealth: Payer: Self-pay | Admitting: *Deleted

## 2012-10-30 NOTE — Telephone Encounter (Signed)
Pt called stating the pharmacy called stating they are no longer carrying the hydrocodone 5-500 mg. Can he get a new rx for the hydrocodone 7.5-325 mg? Also, pt still has knee pain and asks what he should do at this point; another appt? Please advise in Dr [name] absence. Thank you!

## 2012-10-30 NOTE — Telephone Encounter (Signed)
Pls sch OV w/ Dr [name] next week Thx

## 2012-10-31 ENCOUNTER — Other Ambulatory Visit: Payer: Self-pay | Admitting: *Deleted

## 2012-10-31 NOTE — Telephone Encounter (Signed)
Ok for hydrocodone/APAP 5/325 - same instructions as his routine Rx, same prescription, 3 refills

## 2012-11-01 MED ORDER — HYDROCODONE-ACETAMINOPHEN 5-325MG PREPACK (~~LOC~~: ORAL_TABLET | ORAL | Status: DC

## 2012-11-27 ENCOUNTER — Other Ambulatory Visit: Payer: Self-pay | Admitting: Internal Medicine

## 2012-12-25 ENCOUNTER — Other Ambulatory Visit: Payer: Self-pay | Admitting: Internal Medicine

## 2012-12-25 NOTE — Telephone Encounter (Signed)
Pharmacy request refill on medication. Last office visit 12/ 12/2011 for Emergency Room follow up for knee pain. Medication HYDROcodone-acetaminophen 5-325mg .  Please advise.

## 2012-12-26 NOTE — Telephone Encounter (Signed)
PRESCRIPTION CALLED IN TO PHARMACY PER DR. [name] ORDERS OF REFILL ON HYDROCODONE-ACETAMINOPHEN. PATIENT NOTIFIED.

## 2012-12-29 ENCOUNTER — Other Ambulatory Visit: Payer: Self-pay | Admitting: Internal Medicine

## 2012-12-30 ENCOUNTER — Other Ambulatory Visit: Payer: Self-pay | Admitting: *Deleted

## 2012-12-30 MED ORDER — DIAZEPAM 5 MG PO TABS: 5.0000 mg | ORAL_TABLET | Freq: Three times a day (TID) | ORAL | Status: DC | PRN

## 2013-01-21 ENCOUNTER — Other Ambulatory Visit: Payer: Self-pay | Admitting: Internal Medicine

## 2013-02-17 ENCOUNTER — Other Ambulatory Visit: Payer: Self-pay | Admitting: Internal Medicine

## 2013-02-18 NOTE — Telephone Encounter (Signed)
Phone call to pt to let him know his Hydrocodone was called to [facility].

## 2013-02-24 ENCOUNTER — Other Ambulatory Visit: Payer: Self-pay | Admitting: Internal Medicine

## 2013-02-24 NOTE — Telephone Encounter (Signed)
Diazepam called to pharmacy 

## 2013-03-05 ENCOUNTER — Other Ambulatory Visit: Payer: Self-pay | Admitting: Internal Medicine

## 2013-03-05 NOTE — Telephone Encounter (Signed)
Diazepam called to pharmacy 

## 2013-03-12 ENCOUNTER — Other Ambulatory Visit: Payer: Self-pay

## 2013-03-12 NOTE — Telephone Encounter (Signed)
Needs ov

## 2013-03-17 ENCOUNTER — Other Ambulatory Visit: Payer: Self-pay | Admitting: Internal Medicine

## 2013-03-17 NOTE — Telephone Encounter (Signed)
Hydrocodone called to pharmacy  

## 2013-03-25 ENCOUNTER — Other Ambulatory Visit: Payer: Self-pay | Admitting: Internal Medicine

## 2013-03-26 NOTE — Telephone Encounter (Signed)
Diazepam called to pharmacy 

## 2013-03-26 NOTE — Telephone Encounter (Signed)
sch ov w/Dr Norins pls

## 2013-04-04 ENCOUNTER — Other Ambulatory Visit: Payer: Self-pay | Admitting: Internal Medicine

## 2013-04-04 NOTE — Telephone Encounter (Signed)
MD out of office. Is this ok to refill.../lmb 

## 2013-04-04 NOTE — Telephone Encounter (Signed)
No, if need valium this much, and none really since 2012, would need ROV

## 2013-04-09 ENCOUNTER — Other Ambulatory Visit: Payer: Self-pay | Admitting: Internal Medicine

## 2013-04-09 NOTE — Telephone Encounter (Signed)
Ok to refill 30d, no refill (per protocol covering for absent PCP) - prescription printed and signed -  

## 2013-04-10 NOTE — Telephone Encounter (Signed)
Hydrocodone called to patients pharmacy

## 2013-04-17 ENCOUNTER — Other Ambulatory Visit: Payer: Self-pay | Admitting: Internal Medicine

## 2013-04-17 NOTE — Telephone Encounter (Signed)
[facility].../lmb 

## 2013-04-24 ENCOUNTER — Other Ambulatory Visit: Payer: Self-pay | Admitting: Internal Medicine

## 2013-04-25 NOTE — Telephone Encounter (Signed)
Hydrocodone called to pharmacy  

## 2013-04-30 ENCOUNTER — Encounter: Payer: Self-pay | Admitting: Internal Medicine

## 2013-04-30 ENCOUNTER — Ambulatory Visit (INDEPENDENT_AMBULATORY_CARE_PROVIDER_SITE_OTHER): Payer: Self-pay | Admitting: Internal Medicine

## 2013-04-30 ENCOUNTER — Telehealth: Payer: Self-pay | Admitting: Internal Medicine

## 2013-04-30 ENCOUNTER — Other Ambulatory Visit (INDEPENDENT_AMBULATORY_CARE_PROVIDER_SITE_OTHER): Payer: Self-pay

## 2013-04-30 VITALS — BP 150/102 | HR 81 | Temp 98.4°F | Resp 12 | Ht 68.25 in | Wt 274.4 lb

## 2013-04-30 DIAGNOSIS — M549 Dorsalgia, unspecified: Secondary | ICD-10-CM

## 2013-04-30 DIAGNOSIS — Z8669 Personal history of other diseases of the nervous system and sense organs: Secondary | ICD-10-CM

## 2013-04-30 DIAGNOSIS — R202 Paresthesia of skin: Secondary | ICD-10-CM | POA: Insufficient documentation

## 2013-04-30 DIAGNOSIS — I10 Essential (primary) hypertension: Secondary | ICD-10-CM

## 2013-04-30 DIAGNOSIS — F419 Anxiety disorder, unspecified: Secondary | ICD-10-CM

## 2013-04-30 DIAGNOSIS — IMO0001 Reserved for inherently not codable concepts without codable children: Secondary | ICD-10-CM

## 2013-04-30 DIAGNOSIS — E785 Hyperlipidemia, unspecified: Secondary | ICD-10-CM

## 2013-04-30 DIAGNOSIS — F17200 Nicotine dependence, unspecified, uncomplicated: Secondary | ICD-10-CM

## 2013-04-30 DIAGNOSIS — R209 Unspecified disturbances of skin sensation: Secondary | ICD-10-CM

## 2013-04-30 DIAGNOSIS — E669 Obesity, unspecified: Secondary | ICD-10-CM

## 2013-04-30 DIAGNOSIS — R631 Polydipsia: Secondary | ICD-10-CM | POA: Insufficient documentation

## 2013-04-30 DIAGNOSIS — F411 Generalized anxiety disorder: Secondary | ICD-10-CM

## 2013-04-30 LAB — COMPREHENSIVE METABOLIC PANEL
AST: 36 U/L (ref 0–37)
Albumin: 3.9 g/dL (ref 3.5–5.2)
BUN: 11 mg/dL (ref 6–23)
Calcium: 9.6 mg/dL (ref 8.4–10.5)
Chloride: 99 mEq/L (ref 96–112)
Potassium: 3.8 mEq/L (ref 3.5–5.1)
Sodium: 138 mEq/L (ref 135–145)
Total Protein: 7.8 g/dL (ref 6.0–8.3)

## 2013-04-30 LAB — TSH: TSH: 0.99 u[IU]/mL (ref 0.35–5.50)

## 2013-04-30 LAB — HEPATIC FUNCTION PANEL
AST: 36 U/L (ref 0–37)
Alkaline Phosphatase: 65 U/L (ref 39–117)
Total Bilirubin: 0.7 mg/dL (ref 0.3–1.2)

## 2013-04-30 LAB — LIPID PANEL: Total CHOL/HDL Ratio: 6

## 2013-04-30 LAB — SEDIMENTATION RATE: Sed Rate: 21 mm/hr (ref 0–22)

## 2013-04-30 MED ORDER — HYDROCODONE-ACETAMINOPHEN 7.5-325 MG PO TABS: 1.0000 | ORAL_TABLET | ORAL | Status: DC

## 2013-04-30 MED ORDER — FLUOXETINE HCL 40 MG PO CAPS: 40.0000 mg | ORAL_CAPSULE | Freq: Every day | ORAL | Status: DC

## 2013-04-30 NOTE — Assessment & Plan Note (Signed)
Patient with signs of depression with irritability etc.  Plan prozac 40 mg once a day.

## 2013-04-30 NOTE — Assessment & Plan Note (Signed)
He has been working on diet. Needs to continue to loose weight

## 2013-04-30 NOTE — Telephone Encounter (Signed)
Patient notified

## 2013-04-30 NOTE — Assessment & Plan Note (Signed)
Excessive thirst - more than 2 gallons a day of water - concern for DI, central vs renal,  if DM is controlled.   Plan Check Bmet  If normal DM control will work up diabetes insipidus

## 2013-04-30 NOTE — Progress Notes (Signed)
Subjective:    Patient ID: John Rhodes, male    DOB: [birth year], 37 y.o.   MRN: [id number]  HPI He has been getting a metallic taste in his mouth and this is associated with dizziness. This occurs once a week. No post-nasal drainage. Not associated with medications. No hot water brash, heart burn or odynophagia  He is having positional dizziness with standing. Duration is 2-5 sec. He has not collapsed.  He is having erratic behavior with anger, irritability, forgetfulness, trouble with swallow - sips cause him to cough or regurgitate. Increasing difficulty with focus and task completion.  He is having increased back pain - midback pain - making lifting and stooping more difficult. 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 After heat exposure he will get fatigued and will have tremulousness for 30-60 minutes.   He reports CBGs are OK but he has constant and chronic thirst and drinks 2+ gallons of water per day.   Past Medical History  Diagnosis Date  . Diabetes mellitus without complication   . Hypertension   . History of thumb surgery    Past Surgical History  Procedure Laterality Date  . Arm surgery     History reviewed. No pertinent family history. History   Social History  . Marital Status: Single    Spouse Name: N/A    Number of Children: 0  . Years of Education: 12   Occupational History  . Not on file.   Social History Main Topics  . Smoking status: Never Smoker   . Smokeless tobacco: Current User    Types: Chew  . Alcohol Use: Yes  . Drug Use: No  . Sexually Active: Yes -- Male partner(s)   Other Topics Concern  . Not on file   Social History Narrative      HSG. 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 atenolol-chlorthalidone (TENORETIC) 50-25 MG per tablet Take 1 tablet by mouth daily.  30 tablet  11  . diazepam (VALIUM) 5 MG tablet TAKE 1 TABLET EVERY 8 HOURS AS NEEDED  60 tablet  1  . enalapril (VASOTEC) 10 MG tablet TAKE ONE TABLET BY MOUTH EVERY DAY  30 tablet  6  . HYDROcodone-acetaminophen (NORCO/VICODIN) 5-325 MG per tablet TAKE 1 TABLET BY MOUTH EVERY 4 HOURS AS NEEDED FOR PAIN  60 tablet  0  . metFORMIN (GLUCOPHAGE) 500 MG tablet Take 500 mg by mouth 2 (two) times daily with a meal.      . oxyCODONE-acetaminophen (PERCOCET/ROXICET) 5-325 MG per tablet 1-2 tablets po q 6 hours prn moderate to severe pain  20 tablet  0  . sertraline (ZOLOFT) 50 MG tablet Take 1 tablet (50 mg total) by mouth daily.  30 tablet  12   No current facility-administered medications on file prior to visit.      Review of Systems Constitutional:  Negative for fever, chills, activity change and unexpected weight change.  HEENT:  Chronic  hearing loss left ear but no new problems with hearing loss. No  congestion, neck stiffness and postnasal drip. Negative for sore throat or swallowing problems. Negative for dental complaints.   Eyes: Negative for vision loss or  change in visual acuity.  Respiratory: Negative for chest tightness and wheezing - except for rare occasions. Negative for DOE.   Cardiovascular: Negative for chest pain or palpitations. Positive for decreased exercise tolerance Gastrointestinal: No change in bowel habit. No bloating or gas. No reflux or indigestion Genitourinary: Negative for urgency, frequency, flank pain and difficulty urinating.  Musculoskeletal: Negative for myalgia. Positive for back pain, arthralgias/. No gait problem.  Neurological: Negative for dizziness, tremors, weakness and headaches. paresthesia left proximal leg.  Hematological: Negative for adenopathy.  Psychiatric/Behavioral: Negative for behavioral problems and  dysphoric mood.       Objective:   Physical Exam Filed Vitals:   04/30/13 0916  BP: 150/102  Pulse: 81  Temp: 98.4 F (36.9 C)  Resp: 12   Wt Readings from Last 3 Encounters:  04/30/13 274 lb 6.4 oz (124.467 kg)  10/21/12 271 lb (122.925 kg)  04/22/12 274 lb (124.286 kg)   Gen'l - obese white man in no distress HEENT - C&S clear, PERRLA, TMs normal, oropharynx w/o lesions, dentition appears sound Neck - obese, no thyromegaly, no JVD, no carotid bruits Chest - no deformity Lungs - CTAP Cor - 2+ Radial and DP pulse, RRR, no murmur Abd- obese MSK - right proximal UE post-traumatic  Neuro - A&O x 3, CN II-XII normal facial symmetry, EOMI, no nystagmus,normal vision w/o field cuts, normal shoulder shrug, no tongue fasciculations. DTRs 2+ symmetrical. Cerebellar normal gait and balance, intemittent tremor noted Derm - no lesions. Tats arms.        Assessment & Plan:

## 2013-04-30 NOTE — Telephone Encounter (Signed)
My oversight!! He can continue with the zoloft but at 100 mg daily up from 50 mg daily. Thanks

## 2013-04-30 NOTE — Assessment & Plan Note (Signed)
For lipid panel today with recommendations to follow

## 2013-04-30 NOTE — Assessment & Plan Note (Signed)
Back pain getting worse. No focal findings on exam, no radiculopathy  Plan Increase hydrocodone to 7.5 mg q 4  Back exercise - directed to [email or web address]

## 2013-04-30 NOTE — Telephone Encounter (Signed)
Left message for patient to call back  

## 2013-04-30 NOTE — Telephone Encounter (Signed)
Patient needs to know if he is to take the prozac and zoloft.  Please give a call regarding this.

## 2013-04-30 NOTE — Patient Instructions (Addendum)
Multiple problems:  Metallic taste - normal exam. Need to see a dentist.  Positional vertigo - a problem for a while. This can be a normal issue,especially with the brief duration of symptoms. Plan- "rule of 20".  Heat intolerance, generalized weakness - will check thyroid function and cortisol level (adrenal gland).  Diabetes - will check A1C today with recommendations to follow.  Irritability, anger, forgetfulness, etc - all possible symptoms of depression. Plan Fluoxetine (prozac) 40 mg once a day at bedtime  Reduce or eliminate valium  Back pain - on exam there is no evidence of pinched nerve. Regular, daily back exercise will be very helpful. Go to [email or web address] and search back pain and exercise for instructional videos. Continue meds  Thirst - too much free water can affect electrolytes. For the thirst be sure to include mixed salt solutions, e.g. Gator aide etc.

## 2013-04-30 NOTE — Assessment & Plan Note (Signed)
Lab Results  Component Value Date   HGBA1C 6.1 04/22/2012   For lab today with recommendations to follow

## 2013-04-30 NOTE — Assessment & Plan Note (Signed)
Patient strongly advised to stop use. He has no oral lesions at this time.

## 2013-04-30 NOTE — Assessment & Plan Note (Signed)
BP Readings from Last 3 Encounters:  04/30/13 150/102  10/21/12 124/84  10/14/12 142/85   Generally good control.  Plan Lab: Bmet  Continue present meds

## 2013-04-30 NOTE — Telephone Encounter (Signed)
Patient needs to know if he is to still take the zoloft with the prozac.  Please give a call back regarding this.

## 2013-05-14 ENCOUNTER — Telehealth: Payer: Self-pay | Admitting: *Deleted

## 2013-05-14 MED ORDER — SERTRALINE HCL 100 MG PO TABS: 100.0000 mg | ORAL_TABLET | Freq: Every day | ORAL | Status: DC

## 2013-05-14 NOTE — Telephone Encounter (Signed)
Pt called states he was instructed by Dr [name] to increase his Zoloft Rx to 100 mg daily.  Further states he started taking 2 tabs daily which has caused him to run out early.  Pt is requesting a refill with the increased dosage instructions.  Please advise

## 2013-05-14 NOTE — Telephone Encounter (Signed)
New Rx for 100 mg sertraline sent to pharmacy

## 2013-05-15 NOTE — Telephone Encounter (Signed)
Spoke with pt advised Rx sent to Pharmacy as per Dr [name] note.

## 2013-05-23 ENCOUNTER — Other Ambulatory Visit: Payer: Self-pay | Admitting: Internal Medicine

## 2013-06-16 ENCOUNTER — Other Ambulatory Visit: Payer: Self-pay | Admitting: Internal Medicine

## 2013-06-16 NOTE — Telephone Encounter (Signed)
Diazepam called to pharmacy 

## 2013-06-24 ENCOUNTER — Other Ambulatory Visit: Payer: Self-pay | Admitting: Internal Medicine

## 2013-07-22 ENCOUNTER — Other Ambulatory Visit: Payer: Self-pay | Admitting: Internal Medicine

## 2013-07-22 ENCOUNTER — Telehealth: Payer: Self-pay | Admitting: *Deleted

## 2013-07-22 NOTE — Telephone Encounter (Signed)
Pt called requesting Hydrocodone refill. Last OV 6.25.2014.   Please advise

## 2013-07-22 NOTE — Telephone Encounter (Signed)
Hydrocodone has been called to pharmacy

## 2013-07-22 NOTE — Telephone Encounter (Signed)
Done today. Patient to be advised - hydrocodone now schedule II, cannot have refills. Will need OV for 3 mnths scripts

## 2013-07-23 NOTE — Telephone Encounter (Signed)
Left message for pt to return call.

## 2013-08-19 ENCOUNTER — Other Ambulatory Visit: Payer: Self-pay | Admitting: Internal Medicine

## 2013-09-15 ENCOUNTER — Telehealth: Payer: Self-pay | Admitting: Internal Medicine

## 2013-09-15 MED ORDER — HYDROCODONE-ACETAMINOPHEN 7.5-325 MG PO TABS: 1.0000 | ORAL_TABLET | ORAL | Status: DC | PRN

## 2013-09-15 NOTE — Telephone Encounter (Signed)
09/15/2013  Pt left message needing RX :: HYDROcodone-acetaminophen (NORCO) 7.5-325 MG per tablet .  Please contact pt when ready to be picked up.

## 2013-09-15 NOTE — Telephone Encounter (Signed)
Script printed and patient notified. He states he will make an appt when he comes to pick up his script

## 2013-09-15 NOTE — Telephone Encounter (Signed)
Ok for refill(s). Needs OV - 1st available

## 2013-09-22 ENCOUNTER — Other Ambulatory Visit: Payer: Self-pay | Admitting: Internal Medicine

## 2013-09-23 NOTE — Telephone Encounter (Signed)
Diazepam called to pharmacy 

## 2013-09-25 ENCOUNTER — Other Ambulatory Visit: Payer: Self-pay

## 2013-09-25 ENCOUNTER — Other Ambulatory Visit: Payer: Self-pay | Admitting: Internal Medicine

## 2013-10-09 ENCOUNTER — Other Ambulatory Visit (INDEPENDENT_AMBULATORY_CARE_PROVIDER_SITE_OTHER): Payer: Self-pay

## 2013-10-09 ENCOUNTER — Ambulatory Visit (INDEPENDENT_AMBULATORY_CARE_PROVIDER_SITE_OTHER): Payer: Self-pay | Admitting: Internal Medicine

## 2013-10-09 ENCOUNTER — Encounter: Payer: Self-pay | Admitting: Internal Medicine

## 2013-10-09 VITALS — BP 118/90 | HR 82 | Temp 97.9°F | Wt 267.4 lb

## 2013-10-09 DIAGNOSIS — IMO0001 Reserved for inherently not codable concepts without codable children: Secondary | ICD-10-CM

## 2013-10-09 DIAGNOSIS — M549 Dorsalgia, unspecified: Secondary | ICD-10-CM

## 2013-10-09 DIAGNOSIS — I10 Essential (primary) hypertension: Secondary | ICD-10-CM

## 2013-10-09 DIAGNOSIS — E669 Obesity, unspecified: Secondary | ICD-10-CM

## 2013-10-09 LAB — COMPREHENSIVE METABOLIC PANEL
Albumin: 4.2 g/dL (ref 3.5–5.2)
BUN: 12 mg/dL (ref 6–23)
CO2: 25 mEq/L (ref 19–32)
Calcium: 9 mg/dL (ref 8.4–10.5)
Chloride: 95 mEq/L — ABNORMAL LOW (ref 96–112)
Glucose, Bld: 219 mg/dL — ABNORMAL HIGH (ref 70–99)
Potassium: 3.4 mEq/L — ABNORMAL LOW (ref 3.5–5.1)
Total Protein: 7.7 g/dL (ref 6.0–8.3)

## 2013-10-09 MED ORDER — HYDROCODONE-ACETAMINOPHEN 7.5-325 MG PO TABS: 1.0000 | ORAL_TABLET | ORAL | Status: DC | PRN

## 2013-10-09 NOTE — Progress Notes (Signed)
Subjective:    Patient ID: John Rhodes, male    DOB: 1976-07-08, 37 y.o.   MRN: [id number]  HPI Lori was last seen in June - note reviewed - for full exam. Lab at that time revealed elevated serum glucose and A1C 7.7%. He returns today for follow up. He has not had an eye exam.  He remains forgetful; he has trouble choking when he sips thin liquids; 2-3 times a month he will have an episode of flushing, becomes claustrophic and then will become weak. Increasing the zoloft to 100 mg daily did reduce the frequency of episodes. He is also doing better in regard to mood and irritability.   Past Medical History  Diagnosis Date  . Diabetes mellitus without complication   . Hypertension   . History of thumb surgery    Past Surgical History  Procedure Laterality Date  . Arm surgery     History reviewed. No pertinent family history. History   Social History  . Marital Status: Single    Spouse Name: N/A    Number of Children: 0  . Years of Education: 12   Occupational History  . Not on file.   Social History Main Topics  . Smoking status: Never Smoker   . Smokeless tobacco: Current User    Types: Chew  . Alcohol Use: Yes  . Drug Use: No  . Sexual Activity: Yes    Partners: Female   Other Topics Concern  . Not on file   Social History Narrative      HSG. 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 atenolol-chlorthalidone (TENORETIC) 50-25 MG per tablet TAKE ONE TABLET BY MOUTH ONCE DAILY  30 tablet  5  . diazepam (VALIUM) 5 MG tablet TAKE 1 TABLET EVERY 8 HOURS AS NEEDED  60 tablet  1  . enalapril (VASOTEC) 10 MG tablet TAKE ONE TABLET BY MOUTH ONCE DAILY  30 tablet  5  . HYDROcodone-acetaminophen (NORCO) 7.5-325 MG per tablet Take 1 tablet by mouth  every 4 (four) hours as needed for pain.  180 tablet  0  . metFORMIN (GLUCOPHAGE) 500 MG tablet TAKE ONE TABLET BY MOUTH TWICE DAILY WITH MEALS  60 tablet  5  . sertraline (ZOLOFT) 100 MG tablet Take 1 tablet (100 mg total) by mouth daily.  30 tablet  12   No current facility-administered medications on file prior to visit.      Review of Systems System review is negative for any constitutional, cardiac, pulmonary, GI or neuro symptoms or complaints other than as described in the HPI.     Objective:   Physical Exam Filed Vitals:   10/09/13 1050  BP: 118/90  Pulse: 82  Temp: 97.9 F (36.6 C)   Wt Readings from Last 3 Encounters:  10/09/13 267 lb 6.4 oz (121.292 kg)  04/30/13 274 lb 6.4 oz (124.467 kg)  10/21/12 271 lb (122.925 kg)   Gen'l- obese white man in no distress HEENT- normal, EACs clear Cor - 2+ radial and DP pulse, RRR Pulm - normal respirations Abd- obese Neuro - A&O x 3, see DM foot MiniCog - day/date - ok, Pres- ok, current events - ok. Recall - 1/3 Serial 7's - slow but accurate.  Assessment & Plan:

## 2013-10-09 NOTE — Patient Instructions (Signed)
Good to see you (AGAIN)  Limited exam is OK.  Memory - you did well on the MiniCog except for 3 word recall. Makes lists.  The "spells " you have sound like panic attacks. The evaluation and lab studies were normal.  Mood/irritability - doing better on Zoloft 100 mg once a day.  Diabetes - continue to work on no sugar low carb diet. Will check an A1C today with recommendations to be reported using MyChart.  Blood pressure -  BP Readings from Last 3 Encounters:  10/09/13 118/90  04/30/13 150/102  10/21/12 124/84   Generally good control on your present medications.  Weight -  Wt Readings from Last 3 Encounters:  10/09/13 267 lb 6.4 oz (121.292 kg)  04/30/13 274 lb 6.4 oz (124.467 kg)  10/21/12 271 lb (122.925 kg)   Since '09 you have lost 33 lbs.  Plan Diet management: smart food choices, PORTION SIZE CONTROL, regular exercise. Goal - to loose 1-2 lbs.month. Target weight - 220 lbs

## 2013-10-09 NOTE — Progress Notes (Signed)
Pre visit review using our clinic review tool, if applicable. No additional management support is needed unless otherwise documented below in the visit note. 

## 2013-10-10 NOTE — Assessment & Plan Note (Signed)
Discussed again the importance of weight management for health and control of HTN and DM  Plan Diet management: smart food choices, PORTION SIZE CONTROL, regular exercise. Goal - to loose 1-2 lbs.month. Target weight - 220 lbs

## 2013-10-10 NOTE — Assessment & Plan Note (Signed)
Lab Results  Component Value Date   HGBA1C 7.4* 10/09/2013   Better control on metformin alone.  Plan Continue present medication  Better adherence to no sugar, low carb diet and more exercise.

## 2013-10-10 NOTE — Assessment & Plan Note (Signed)
BP Readings from Last 3 Encounters:  10/09/13 118/90  04/30/13 150/102  10/21/12 124/84   OK control on present regimen - will continue.

## 2013-10-10 NOTE — Assessment & Plan Note (Signed)
Continue c/o limiting low back pain.  PLan L-S spine films  Did renew pain meds.

## 2013-10-13 ENCOUNTER — Telehealth: Payer: Self-pay

## 2013-10-13 NOTE — Telephone Encounter (Signed)
Patient returned call and states he is out of town til Dec 10. I let him know the order is in and he can come in when he is available

## 2013-10-13 NOTE — Telephone Encounter (Signed)
Message copied by Noreene Larsson on Mon Oct 13, 2013  8:18 AM ------      Message from: Jacques Navy      Created: Fri Oct 10, 2013  4:51 PM       Please call patient - he needs to come in for L-S spine films as part of evaluation of back pain.            Thanks ------

## 2013-10-13 NOTE — Telephone Encounter (Signed)
Left message for patient to return call.

## 2013-11-19 ENCOUNTER — Other Ambulatory Visit: Payer: Self-pay | Admitting: Internal Medicine

## 2013-12-14 ENCOUNTER — Other Ambulatory Visit: Payer: Self-pay | Admitting: Internal Medicine

## 2013-12-22 ENCOUNTER — Other Ambulatory Visit: Payer: Self-pay | Admitting: Internal Medicine

## 2013-12-25 ENCOUNTER — Other Ambulatory Visit: Payer: Self-pay | Admitting: Internal Medicine

## 2014-01-07 ENCOUNTER — Other Ambulatory Visit: Payer: Self-pay | Admitting: Internal Medicine

## 2014-01-08 ENCOUNTER — Telehealth: Payer: Self-pay | Admitting: *Deleted

## 2014-01-08 NOTE — Telephone Encounter (Signed)
Needs ov and he can be added on.

## 2014-01-08 NOTE — Telephone Encounter (Signed)
Patient phoned requesting refill for his norco (last ordered 10/09/13) and last OV with PCP 10/09/13.  Please advise.  CB# [phone]

## 2014-01-08 NOTE — Telephone Encounter (Signed)
Phoned and relayed MD's response and instructions to patient, who was NOT happy, but transferred him to scheduling for an OV.

## 2014-01-12 ENCOUNTER — Other Ambulatory Visit: Payer: Self-pay

## 2014-01-12 ENCOUNTER — Ambulatory Visit (INDEPENDENT_AMBULATORY_CARE_PROVIDER_SITE_OTHER): Payer: Self-pay | Admitting: Internal Medicine

## 2014-01-12 ENCOUNTER — Encounter: Payer: Self-pay | Admitting: Internal Medicine

## 2014-01-12 VITALS — BP 158/104 | HR 84 | Temp 98.3°F | Wt 268.0 lb

## 2014-01-12 DIAGNOSIS — I10 Essential (primary) hypertension: Secondary | ICD-10-CM

## 2014-01-12 DIAGNOSIS — E669 Obesity, unspecified: Secondary | ICD-10-CM

## 2014-01-12 DIAGNOSIS — IMO0001 Reserved for inherently not codable concepts without codable children: Secondary | ICD-10-CM

## 2014-01-12 DIAGNOSIS — E785 Hyperlipidemia, unspecified: Secondary | ICD-10-CM

## 2014-01-12 DIAGNOSIS — R072 Precordial pain: Secondary | ICD-10-CM | POA: Insufficient documentation

## 2014-01-12 DIAGNOSIS — E1165 Type 2 diabetes mellitus with hyperglycemia: Secondary | ICD-10-CM

## 2014-01-12 DIAGNOSIS — M549 Dorsalgia, unspecified: Secondary | ICD-10-CM

## 2014-01-12 DIAGNOSIS — F419 Anxiety disorder, unspecified: Secondary | ICD-10-CM

## 2014-01-12 DIAGNOSIS — F411 Generalized anxiety disorder: Secondary | ICD-10-CM

## 2014-01-12 DIAGNOSIS — R079 Chest pain, unspecified: Secondary | ICD-10-CM

## 2014-01-12 MED ORDER — METFORMIN HCL 500 MG PO TABS: 500.0000 mg | ORAL_TABLET | Freq: Three times a day (TID) | ORAL | Status: DC

## 2014-01-12 MED ORDER — HYDROCODONE-ACETAMINOPHEN 7.5-325 MG PO TABS: 1.0000 | ORAL_TABLET | ORAL | Status: DC | PRN

## 2014-01-12 MED ORDER — DIAZEPAM 5 MG PO TABS: ORAL_TABLET | ORAL | Status: DC

## 2014-01-12 NOTE — Assessment & Plan Note (Addendum)
BP Readings from Last 3 Encounters:  01/12/14 158/104  10/09/13 118/90  04/30/13 150/102   Patient has not taken medication today.   Plan: Increase Enalapril to 59m

## 2014-01-12 NOTE — Assessment & Plan Note (Addendum)
Wt Readings from Last 3 Encounters:  01/12/14 268 lb (121.564 kg)  10/09/13 267 lb 6.4 oz (121.292 kg)  04/30/13 274 lb 6.4 oz (124.467 kg)  Body mass index is 40.43 kg/(m^2).  Goal weight: 200 lbs.  Plan Continue dieting, eating better, goal: 1-2lbs / week. This is a long term project.   Cut out sugar drinks. Be as active as tolerated.

## 2014-01-12 NOTE — Assessment & Plan Note (Addendum)
Chest pain with exertion and sweating. Feels like someone is squeezing chest. Will feel better after 10-15 minutes. Can't catch breath and room is closing in. EKG normal today.  Plan Cardiology referral for risk stratification in the setting of multiple risk factors and strong family history

## 2014-01-12 NOTE — Patient Instructions (Addendum)
It has been a pleasure providing care for you today.  1) Blood Pressure - It is important to control your blood pressure. - Your blood pressure was high, so we increased the dose of your Enalapril from 67m to 218m  - Check your blood pressure outside the clinic - can purchase an automatic blood pressure meter. - Send me a MyChart message with the results.  2) Chest Pain - The EKG was completely normal in the office today. - We will refer to Cardiology. They will evaluate you and decide on next steps.  3) Obesity - Keep up the good work! Controlling your weight is one of the most important things you can do for your health. - Keep up the low sugar diet. Exercise as tolerated. Work on reducing portion size.   4) Diabetes - Low sugar diet will help to control your diabetes. - Increase metformin to three pills a day (1500 mg total) - Get your vision checked by an optometrist soon.  5) Anxiety - Continue current regimen.  - Sometimes it can be scary to have all of these issues.

## 2014-01-12 NOTE — Assessment & Plan Note (Addendum)
Lab Results  Component Value Date   HGBA1C 7.4* 10/09/2013   Patient has been taking metformin TID instead of BID as prescribed.  Plan Continue Metformin 59m TID.   Check A1C in March  Continue to control diet.

## 2014-01-12 NOTE — Assessment & Plan Note (Addendum)
Patient has increased irritability and difficulty with stress management. It lasts less than a half hour. Patient reports stable on current medication  Plan Continue current medication.

## 2014-01-12 NOTE — Progress Notes (Signed)
Pre visit review using our clinic review tool, if applicable. No additional management support is needed unless otherwise documented below in the visit note. 

## 2014-01-12 NOTE — Progress Notes (Signed)
Subjective:     Patient ID: John Rhodes, male   DOB: 1976/01/08, 37 y.o.   MRN: [id number]  HPI  Patient reports dizziness. Chest pain with exertion and sweating. Feels like someone is squeezing chest. Will feel better after 10-15 minutes. Can't catch breath and room is closing in. Leg gives out.   Reports angry episodes. Cant remember things. 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   Has been taking Metformin 554m TID instead of BID as prescribed. Reports better dietary control.    Body mass index is 40.43 kg/(m^2).  Past Medical History  Diagnosis Date  . Diabetes mellitus without complication   . Hypertension   . History of thumb surgery    Past Surgical History  Procedure Laterality Date  . Arm surgery     History reviewed. No pertinent family history. History   Social History  . Marital Status: Single    Spouse Name: N/A    Number of Children: 0  . Years of Education: 12   Occupational History  . Not on file.   Social History Main Topics  . Smoking status: Never Smoker   . Smokeless tobacco: Current User    Types: Chew  . Alcohol Use: Yes  . Drug Use: No  . Sexual Activity: Yes    Partners: Female   Other Topics Concern  . Not on file   Social History Narrative      HSG. 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 Work: several jobs: bAnimator cMedical sales representativework. Unemployed '09      Review of Systems  System review is negative for any constitutional, cardiac, pulmonary, GI or neuro symptoms or complaints other than as described in the HPI.     Objective:   Physical Exam  Filed Vitals:   01/12/14 1421  BP: 158/104  Pulse: 84  Temp: 98.3 F (36.8 C)   Wt Readings from Last 3 Encounters:  01/12/14 268 lb (121.564 kg)  10/09/13 267 lb 6.4 oz (121.292 kg)  04/30/13 274 lb 6.4 oz (124.467 kg)   Body mass index is 40.43  kg/(m^2).'  General: Well developed, well nourished, NAD, appears stated age H35 NCAT, PERRLA, EOMI, Anicteic Sclera, mucous membranes moist.  Neck: Supple, no JVD, no masses  Cardiovascular: S1 S2 auscultated, no rubs, murmurs or gallops. Regular rate and rhythm.  Respiratory: Clear to auscultation bilaterally with equal chest rise  Abdomen: Soft, nontender, nondistended, + bowel sounds  Extremities: warm, dry without cyanosis, clubbing, or edema  Neuro: Alert and Oriented x3, cranial nerves II-XII grossly intact.  Skin: Without rashes, exudates, or nodules  Psych: Normal mood and affect with intact judgement and insight  Current Outpatient Prescriptions on File Prior to Visit  Medication Sig Dispense Refill  . atenolol-chlorthalidone (TENORETIC) 50-25 MG per tablet TAKE ONE TABLET BY MOUTH ONCE DAILY  30 tablet  5  . diazepam (VALIUM) 5 MG tablet TAKE ONE TABLET BY MOUTH EVERY 8 HOURS AS NEEDED FOR ANXIETY OR SLEEP  30 tablet  0  . enalapril (VASOTEC) 10 MG tablet TAKE ONE TABLET BY MOUTH ONCE DAILY  30 tablet  5  . HYDROcodone-acetaminophen (NORCO) 7.5-325 MG per tablet Take 1 tablet by mouth every 4 (four) hours as needed.  180 tablet  0  . metFORMIN (GLUCOPHAGE) 500 MG tablet TAKE ONE TABLET BY MOUTH TWICE DAILY WITH MEALS  60  tablet  5  . sertraline (ZOLOFT) 100 MG tablet Take 1 tablet (100 mg total) by mouth daily.  30 tablet  12   No current facility-administered medications on file prior to visit.    Lab Results  Component Value Date   HGBA1C 7.4* 10/09/2013       Assessment and Plan:

## 2014-01-13 ENCOUNTER — Other Ambulatory Visit: Payer: Self-pay | Admitting: Internal Medicine

## 2014-01-13 NOTE — Assessment & Plan Note (Signed)
Last LDL 102.5

## 2014-01-13 NOTE — Assessment & Plan Note (Signed)
Continues on established regimen. No report of constipation or mental status changes.  Plan Refill Rx for 3 months.

## 2014-01-21 ENCOUNTER — Other Ambulatory Visit: Payer: Self-pay | Admitting: Internal Medicine

## 2014-01-29 ENCOUNTER — Other Ambulatory Visit: Payer: Self-pay | Admitting: Internal Medicine

## 2014-01-29 NOTE — Telephone Encounter (Signed)
Ok to refill with 5 refills

## 2014-02-02 ENCOUNTER — Encounter: Payer: Self-pay | Admitting: Internal Medicine

## 2014-02-04 ENCOUNTER — Institutional Professional Consult (permissible substitution): Payer: Self-pay | Admitting: Cardiology

## 2014-04-08 ENCOUNTER — Encounter: Payer: Self-pay | Admitting: Internal Medicine

## 2014-04-08 ENCOUNTER — Ambulatory Visit (INDEPENDENT_AMBULATORY_CARE_PROVIDER_SITE_OTHER): Payer: Self-pay | Admitting: Internal Medicine

## 2014-04-08 VITALS — BP 138/88 | HR 88 | Temp 98.4°F | Ht 67.0 in | Wt 272.0 lb

## 2014-04-08 DIAGNOSIS — F411 Generalized anxiety disorder: Secondary | ICD-10-CM

## 2014-04-08 DIAGNOSIS — F419 Anxiety disorder, unspecified: Secondary | ICD-10-CM

## 2014-04-08 DIAGNOSIS — I10 Essential (primary) hypertension: Secondary | ICD-10-CM

## 2014-04-08 DIAGNOSIS — E119 Type 2 diabetes mellitus without complications: Secondary | ICD-10-CM

## 2014-04-08 DIAGNOSIS — M549 Dorsalgia, unspecified: Secondary | ICD-10-CM

## 2014-04-08 MED ORDER — SERTRALINE HCL 100 MG PO TABS: 150.0000 mg | ORAL_TABLET | Freq: Every day | ORAL | Status: DC

## 2014-04-08 MED ORDER — LISINOPRIL-HYDROCHLOROTHIAZIDE 20-12.5 MG PO TABS: 2.0000 | ORAL_TABLET | Freq: Every day | ORAL | Status: DC

## 2014-04-08 MED ORDER — METFORMIN HCL 500 MG PO TABS: 500.0000 mg | ORAL_TABLET | Freq: Three times a day (TID) | ORAL | Status: DC

## 2014-04-08 MED ORDER — HYDROCODONE-ACETAMINOPHEN 7.5-325 MG PO TABS: 1.0000 | ORAL_TABLET | Freq: Four times a day (QID) | ORAL | Status: DC | PRN

## 2014-04-08 MED ORDER — ALPRAZOLAM 0.5 MG PO TBDP: 0.5000 mg | ORAL_TABLET | Freq: Two times a day (BID) | ORAL | Status: DC | PRN

## 2014-04-08 NOTE — Assessment & Plan Note (Signed)
Will cut back on hydrocodone to 4 per day at most

## 2014-04-08 NOTE — Progress Notes (Signed)
Pre visit review using our clinic review tool, if applicable. No additional management support is needed unless otherwise documented below in the visit note. 
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
## 2014-04-08 NOTE — Assessment & Plan Note (Signed)
Will recheck A1c next month

## 2014-04-08 NOTE — Assessment & Plan Note (Signed)
Some panic as well Will increase the sertraline to 150 Stop diazepam--may be cause of cognitive problems Will give emergency supply of alprazolam

## 2014-04-08 NOTE — Patient Instructions (Addendum)
Please try melatonin 31m at bedtime. You can increase to 191mafter a week or so if the 42m41ms not effective. Please set up blood work for about 1 month (met B--- 401.9, hgbA1c - 250.00)

## 2014-04-08 NOTE — Assessment & Plan Note (Signed)
BP Readings from Last 3 Encounters:  04/08/14 138/88  01/12/14 158/104  10/09/13 118/90   Seems fine despite being off med Will just change to lisinopril since less money

## 2014-05-11 ENCOUNTER — Other Ambulatory Visit: Payer: Self-pay

## 2014-05-11 MED ORDER — HYDROCODONE-ACETAMINOPHEN 7.5-325 MG PO TABS: 1.0000 | ORAL_TABLET | Freq: Four times a day (QID) | ORAL | Status: DC | PRN

## 2014-05-11 NOTE — Telephone Encounter (Signed)
Pt left v/m requesting rx hydrocodone apap. Call when ready for pick up.

## 2014-05-12 NOTE — Telephone Encounter (Signed)
Spoke with patient and advised rx ready for pick-up and it will be at the front desk.  

## 2014-06-10 ENCOUNTER — Other Ambulatory Visit: Payer: Self-pay

## 2014-06-10 MED ORDER — HYDROCODONE-ACETAMINOPHEN 7.5-325 MG PO TABS: 1.0000 | ORAL_TABLET | Freq: Four times a day (QID) | ORAL | Status: DC | PRN

## 2014-06-10 NOTE — Telephone Encounter (Signed)
Spoke with patient and advised rx ready for pick-up and it will be at the front desk.  

## 2014-06-10 NOTE — Telephone Encounter (Signed)
Pt left v/m requesting rx hydrocodone apap. Call when ready for pick up. Pt wanted Dr Silvio Pate to know that pt is unemployed and pt is working with [facility] for assistance with pts current billing.

## 2014-06-11 ENCOUNTER — Encounter: Payer: Self-pay | Admitting: *Deleted

## 2014-06-11 ENCOUNTER — Encounter: Payer: Self-pay | Admitting: Internal Medicine

## 2014-06-29 ENCOUNTER — Encounter: Payer: Self-pay | Admitting: Internal Medicine

## 2014-07-13 ENCOUNTER — Other Ambulatory Visit: Payer: Self-pay

## 2014-07-13 MED ORDER — HYDROCODONE-ACETAMINOPHEN 7.5-325 MG PO TABS
1.0000 | ORAL_TABLET | Freq: Four times a day (QID) | ORAL | Status: DC | PRN
Start: 2014-07-13 — End: 2014-08-12

## 2014-07-13 NOTE — Telephone Encounter (Signed)
No answer at home number, will leave rx at front desk for pick-up?

## 2014-07-13 NOTE — Telephone Encounter (Signed)
Pt left v/m requesting rx hydrocodone apap. Call when ready for pick up.

## 2014-08-12 ENCOUNTER — Other Ambulatory Visit: Payer: Self-pay

## 2014-08-12 NOTE — Telephone Encounter (Signed)
Pt left v/m requesting rx hydrocodone apap. Call when ready for pick up.

## 2014-08-13 ENCOUNTER — Telehealth: Payer: Self-pay | Admitting: Internal Medicine

## 2014-08-13 MED ORDER — HYDROCODONE-ACETAMINOPHEN 7.5-325 MG PO TABS: 1.0000 | ORAL_TABLET | Freq: Four times a day (QID) | ORAL | Status: DC | PRN

## 2014-08-13 NOTE — Telephone Encounter (Signed)
Spoke with patient and advised results, he scheduled an appt for next month and he still stated he has [facility] insurance, please see note from [place] below as just a FYI.  Spoke with patient and advised rx ready for pick-up and it will be at the front desk.

## 2014-08-13 NOTE — Telephone Encounter (Signed)
Patient has a outstanding balance and was supposed to apply for assistance, he has not done so yet, he can not come in for OV, please advise

## 2014-08-13 NOTE — Telephone Encounter (Signed)
He needs to be seen by November, one way or another

## 2014-08-13 NOTE — Telephone Encounter (Signed)
Okay 

## 2014-08-13 NOTE — Telephone Encounter (Signed)
John Rhodes,   I checked w/[facility] and as of today Mr. John Rhodes has not turned in any financial assistance forms.   Hope this helps you!   Thanks!  Kalman Shan

## 2014-08-13 NOTE — Telephone Encounter (Signed)
Opened in error

## 2014-09-02 ENCOUNTER — Encounter: Payer: Self-pay | Admitting: Internal Medicine

## 2014-09-02 ENCOUNTER — Ambulatory Visit (INDEPENDENT_AMBULATORY_CARE_PROVIDER_SITE_OTHER): Payer: Self-pay | Admitting: Internal Medicine

## 2014-09-02 VITALS — BP 120/84 | HR 113 | Temp 98.7°F | Resp 16 | Wt 258.5 lb

## 2014-09-02 DIAGNOSIS — F419 Anxiety disorder, unspecified: Secondary | ICD-10-CM

## 2014-09-02 DIAGNOSIS — M549 Dorsalgia, unspecified: Secondary | ICD-10-CM

## 2014-09-02 DIAGNOSIS — Z23 Encounter for immunization: Secondary | ICD-10-CM

## 2014-09-02 DIAGNOSIS — E1149 Type 2 diabetes mellitus with other diabetic neurological complication: Secondary | ICD-10-CM

## 2014-09-02 DIAGNOSIS — G8929 Other chronic pain: Secondary | ICD-10-CM

## 2014-09-02 DIAGNOSIS — I10 Essential (primary) hypertension: Secondary | ICD-10-CM

## 2014-09-02 DIAGNOSIS — E1140 Type 2 diabetes mellitus with diabetic neuropathy, unspecified: Secondary | ICD-10-CM

## 2014-09-02 LAB — COMPREHENSIVE METABOLIC PANEL
ALBUMIN: 4 g/dL (ref 3.5–5.2)
ALK PHOS: 77 U/L (ref 39–117)
ALT: 41 U/L (ref 0–53)
AST: 32 U/L (ref 0–37)
BUN: 11 mg/dL (ref 6–23)
CO2: 26 mEq/L (ref 19–32)
CREATININE: 0.9 mg/dL (ref 0.4–1.5)
Calcium: 9.5 mg/dL (ref 8.4–10.5)
Chloride: 101 mEq/L (ref 96–112)
GFR: 99 mL/min (ref >60.00)
Glucose, Bld: 198 mg/dL — ABNORMAL HIGH (ref 70–99)
POTASSIUM: 3.6 meq/L (ref 3.5–5.1)
Sodium: 137 mEq/L (ref 135–145)
Total Bilirubin: 0.7 mg/dL (ref 0.2–1.2)
Total Protein: 8.5 g/dL — ABNORMAL HIGH (ref 6.0–8.3)

## 2014-09-02 LAB — CBC WITH DIFFERENTIAL/PLATELET
Basophils Absolute: 0 10*3/uL (ref 0.0–0.1)
Basophils Relative: 0.4 % (ref 0.0–3.0)
EOS ABS: 0.3 10*3/uL (ref 0.0–0.7)
Eosinophils Relative: 3 % (ref 0.0–5.0)
HCT: 50.5 % (ref 39.0–52.0)
HEMOGLOBIN: 16.9 g/dL (ref 13.0–17.0)
Lymphocytes Relative: 28.6 % (ref 12.0–46.0)
Lymphs Abs: 3.2 10*3/uL (ref 0.7–4.0)
MCHC: 33.5 g/dL (ref 30.0–36.0)
MCV: 90.2 fl (ref 78.0–100.0)
MONO ABS: 0.8 10*3/uL (ref 0.1–1.0)
Monocytes Relative: 7.5 % (ref 3.0–12.0)
NEUTROS ABS: 6.7 10*3/uL (ref 1.4–7.7)
Neutrophils Relative %: 60.5 % (ref 43.0–77.0)
Platelets: 207 10*3/uL (ref 150.0–400.0)
RBC: 5.6 Mil/uL (ref 4.22–5.81)
RDW: 13.4 % (ref 11.5–15.5)
WBC: 11.1 10*3/uL — ABNORMAL HIGH (ref 4.0–10.5)

## 2014-09-02 LAB — LIPID PANEL
CHOL/HDL RATIO: 7
Cholesterol: 193 mg/dL (ref 0–200)
HDL: 28.6 mg/dL — ABNORMAL LOW (ref >39.00)
NONHDL: 164.4
Triglycerides: 355 mg/dL — ABNORMAL HIGH (ref 0.0–149.0)
VLDL: 71 mg/dL — ABNORMAL HIGH (ref 0.0–40.0)

## 2014-09-02 LAB — HEMOGLOBIN A1C: HEMOGLOBIN A1C: 7.2 % — AB (ref 4.6–6.5)

## 2014-09-02 LAB — T4, FREE: Free T4: 1.22 ng/dL (ref 0.60–1.60)

## 2014-09-02 LAB — HM DIABETES FOOT EXAM

## 2014-09-02 LAB — LDL CHOLESTEROL, DIRECT: LDL DIRECT: 100 mg/dL

## 2014-09-02 MED ORDER — ALPRAZOLAM 0.5 MG PO TABS: 0.5000 mg | ORAL_TABLET | Freq: Two times a day (BID) | ORAL | Status: DC | PRN

## 2014-09-02 NOTE — Assessment & Plan Note (Signed)
Seems to still be well controlled Progressive weight loss fortunately

## 2014-09-02 NOTE — Assessment & Plan Note (Signed)
Uses the hydrocodone regularly

## 2014-09-02 NOTE — Progress Notes (Signed)
   Subjective:    Patient ID: John Rhodes, male    DOB: [birth year], 38 y.o.   MRN: [id number]  HPI Here with fiancee  Doing okay Nerves still a problem Couldn't get the alprazolam --- Rx for the dissolvable tab by accident and too much money Sertraline at higher dose seems to help  On the lisinopril No chest pain Some DOE--- stable  Ongoing back pain Uses 4 hydrocodone daily still Has not been able to cut back  Hasn't been able to check sugars as much Usually around 100 when he checks No hypoglycemic spells Some burning and stinging in feet. No ulcers  Current Outpatient Prescriptions on File Prior to Visit  Medication Sig Dispense Refill  . ALPRAZolam (NIRAVAM) 0.5 MG dissolvable tablet Take 1 tablet (0.5 mg total) by mouth 2 (two) times daily as needed for anxiety.  30 tablet  0  . HYDROcodone-acetaminophen (NORCO) 7.5-325 MG per tablet Take 1 tablet by mouth every 6 (six) hours as needed.  120 tablet  0  . lisinopril-hydrochlorothiazide (PRINZIDE,ZESTORETIC) 20-12.5 MG per tablet Take 2 tablets by mouth daily.  90 tablet  11  . metFORMIN (GLUCOPHAGE) 500 MG tablet Take 1 tablet (500 mg total) by mouth 3 (three) times daily.  90 tablet  11  . sertraline (ZOLOFT) 100 MG tablet Take 1.5 tablets (150 mg total) by mouth daily.  45 tablet  12   No current facility-administered medications on file prior to visit.    Allergies  Allergen Reactions  . Penicillins     Past Medical History  Diagnosis Date  . Diabetes mellitus without complication   . Hypertension   . History of thumb surgery   . Chronic back pain   . Anxiety     Past Surgical History  Procedure Laterality Date  . Arm surgery      No family history on file.  History   Social History  . Marital Status: Single    Spouse Name: N/A    Number of Children: 0  . Years of Education: 12   Occupational History  . Unemployed since 2012     filed for disability   Social History Main Topics  .  Smoking status: Never Smoker   . Smokeless tobacco: Current User    Types: Chew  . Alcohol Use: Yes  . Drug Use: No  . Sexual Activity: Yes    Partners: Female   Other Topics Concern  . Not on file   Social History Narrative   Lives with fiancee-- Loma Sousa   Review of Systems Trying to be more careful with eating. Has lost more weight (was 300# when diagnosed) Sleeps is still tough---trouble initiating but then sleeps well    Objective:   Physical Exam  Constitutional: He appears well-developed. No distress.  Neck: Normal range of motion. Neck supple. No thyromegaly present.  Cardiovascular: Normal rate, regular rhythm, normal heart sounds and intact distal pulses.  Exam reveals no gallop.   No murmur heard. Pulmonary/Chest: Effort normal and breath sounds normal. No respiratory distress. He has no wheezes. He has no rales.  Musculoskeletal: He exhibits no edema and no tenderness.  Lymphadenopathy:    He has no cervical adenopathy.  Neurological:  Mild decreased fine touch sensation on plantar feet  Skin: No rash noted. No erythema.  No foot lesions  Psychiatric: He has a normal mood and affect. His behavior is normal.          Assessment & Plan:

## 2014-09-02 NOTE — Assessment & Plan Note (Signed)
Doing better but still needs the alprazolam regularly

## 2014-09-02 NOTE — Assessment & Plan Note (Signed)
BP Readings from Last 3 Encounters:  09/02/14 120/84  04/08/14 138/88  01/12/14 158/104   Good control

## 2014-09-03 ENCOUNTER — Telehealth: Payer: Self-pay | Admitting: Internal Medicine

## 2014-09-03 NOTE — Telephone Encounter (Signed)
emmi emailed °

## 2014-09-11 ENCOUNTER — Other Ambulatory Visit: Payer: Self-pay

## 2014-09-11 MED ORDER — HYDROCODONE-ACETAMINOPHEN 7.5-325 MG PO TABS: 1.0000 | ORAL_TABLET | Freq: Four times a day (QID) | ORAL | Status: DC | PRN

## 2014-09-11 NOTE — Telephone Encounter (Signed)
Cell number just rang, no VM, Left message on home machine that rx is ready for pick-up, and it will be at our front desk.

## 2014-09-11 NOTE — Telephone Encounter (Signed)
Pt left v/m requesting rx hydrocodone apap. Call when ready for pick up.

## 2014-10-05 ENCOUNTER — Other Ambulatory Visit: Payer: Self-pay | Admitting: Internal Medicine

## 2014-10-05 NOTE — Telephone Encounter (Signed)
09/02/14 

## 2014-10-06 NOTE — Telephone Encounter (Signed)
rx called into pharmacy

## 2014-10-06 NOTE — Telephone Encounter (Signed)
Okay #60 x 0

## 2014-10-06 NOTE — Telephone Encounter (Signed)
Approved.  

## 2014-10-12 ENCOUNTER — Other Ambulatory Visit: Payer: Self-pay

## 2014-10-12 NOTE — Telephone Encounter (Signed)
Pt left v/m requesting rx hydrocodone apap. Call when ready for pick up.

## 2014-10-13 MED ORDER — HYDROCODONE-ACETAMINOPHEN 7.5-325 MG PO TABS: 1.0000 | ORAL_TABLET | Freq: Four times a day (QID) | ORAL | Status: DC | PRN

## 2014-10-13 NOTE — Telephone Encounter (Signed)
Patient notified by telephone that script is up front ready for pickup. 

## 2014-11-04 ENCOUNTER — Other Ambulatory Visit: Payer: Self-pay | Admitting: Internal Medicine

## 2014-11-05 NOTE — Telephone Encounter (Signed)
10/06/14 

## 2014-11-05 NOTE — Telephone Encounter (Signed)
rx called into pharmacy

## 2014-11-05 NOTE — Telephone Encounter (Signed)
Approved: #60 x 0

## 2014-11-10 ENCOUNTER — Other Ambulatory Visit: Payer: Self-pay

## 2014-11-10 NOTE — Telephone Encounter (Signed)
Pt left v/m requesting rx hydrocodone apap. Call when ready for pick up.

## 2014-11-11 MED ORDER — HYDROCODONE-ACETAMINOPHEN 7.5-325 MG PO TABS: 1.0000 | ORAL_TABLET | Freq: Four times a day (QID) | ORAL | Status: DC | PRN

## 2014-11-11 NOTE — Telephone Encounter (Signed)
Phone just rang no VM, then a recording came on that said this person is not available, will leave rx up front for pick-up

## 2014-12-09 ENCOUNTER — Other Ambulatory Visit: Payer: Self-pay

## 2014-12-09 MED ORDER — HYDROCODONE-ACETAMINOPHEN 7.5-325 MG PO TABS: 1.0000 | ORAL_TABLET | Freq: Four times a day (QID) | ORAL | Status: DC | PRN

## 2014-12-09 NOTE — Telephone Encounter (Signed)
Spoke with patient and advised rx ready for pick-up and it will be at the front desk.  

## 2014-12-09 NOTE — Telephone Encounter (Signed)
Pt left v/m requesting rx hydrocodone apap. Call when ready for pick up.

## 2014-12-09 NOTE — Telephone Encounter (Signed)
Approved: #[id number] x 0 Prepare and I will sign tomorrow

## 2015-01-08 ENCOUNTER — Other Ambulatory Visit: Payer: Self-pay | Admitting: Internal Medicine

## 2015-01-11 NOTE — Telephone Encounter (Signed)
Approved: #60 x 0

## 2015-01-11 NOTE — Telephone Encounter (Signed)
Rx called to pharmacy as instructed.

## 2015-01-11 NOTE — Telephone Encounter (Signed)
Received refill request electronically. Last refill 11/05/14 #60, last office visit 09/02/14. Is it okay to refill medication?

## 2015-01-12 ENCOUNTER — Other Ambulatory Visit: Payer: Self-pay | Admitting: Internal Medicine

## 2015-01-12 MED ORDER — HYDROCODONE-ACETAMINOPHEN 7.5-325 MG PO TABS: 1.0000 | ORAL_TABLET | Freq: Four times a day (QID) | ORAL | Status: DC | PRN

## 2015-01-12 NOTE — Telephone Encounter (Signed)
Patient notified by telephone that script is up front ready for pickup. 

## 2015-01-12 NOTE — Telephone Encounter (Signed)
Pt called requesting refill for hydrocodone. Please advise

## 2015-02-08 ENCOUNTER — Other Ambulatory Visit: Payer: Self-pay

## 2015-02-08 MED ORDER — HYDROCODONE-ACETAMINOPHEN 7.5-325 MG PO TABS: 1.0000 | ORAL_TABLET | Freq: Four times a day (QID) | ORAL | Status: DC | PRN

## 2015-02-08 NOTE — Telephone Encounter (Signed)
Pt left v/m requesting rx hydrocodone apap. Call when ready for pick up. Pt last seen 09/02/14.

## 2015-02-09 NOTE — Telephone Encounter (Signed)
Message stating pt is not available, will leave rx up front for patient pick-up

## 2015-02-19 ENCOUNTER — Other Ambulatory Visit: Payer: Self-pay | Admitting: Internal Medicine

## 2015-02-19 NOTE — Telephone Encounter (Signed)
Xanax refill request.  Last seen 09/02/2014.  Last filled 01/11/2015.

## 2015-02-19 NOTE — Telephone Encounter (Signed)
Called to [facility].

## 2015-02-19 NOTE — Telephone Encounter (Signed)
Approved: #60 x 0

## 2015-02-23 ENCOUNTER — Encounter: Payer: Self-pay | Admitting: Internal Medicine

## 2015-03-10 ENCOUNTER — Other Ambulatory Visit: Payer: Self-pay | Admitting: *Deleted

## 2015-03-10 MED ORDER — HYDROCODONE-ACETAMINOPHEN 7.5-325 MG PO TABS: 1.0000 | ORAL_TABLET | Freq: Four times a day (QID) | ORAL | Status: DC | PRN

## 2015-03-10 NOTE — Telephone Encounter (Signed)
Pt left voicemail at Triage requesting refill of Rx

## 2015-03-10 NOTE — Telephone Encounter (Signed)
Spoke with patient and advised rx ready for pick-up and it will be at the front desk.  

## 2015-03-22 ENCOUNTER — Other Ambulatory Visit: Payer: Self-pay | Admitting: Internal Medicine

## 2015-03-22 NOTE — Telephone Encounter (Signed)
02/19/15 

## 2015-03-22 NOTE — Telephone Encounter (Signed)
rx called into pharmacy

## 2015-03-22 NOTE — Telephone Encounter (Signed)
Approved: #60 x 0

## 2015-04-07 ENCOUNTER — Other Ambulatory Visit: Payer: Self-pay

## 2015-04-07 MED ORDER — HYDROCODONE-ACETAMINOPHEN 7.5-325 MG PO TABS: 1.0000 | ORAL_TABLET | Freq: Four times a day (QID) | ORAL | Status: DC | PRN

## 2015-04-07 NOTE — Telephone Encounter (Signed)
Spoke with patient and advised rx ready for pick-up and it will be at the front desk.  

## 2015-04-07 NOTE — Telephone Encounter (Signed)
Pt left v/m requesting rx hydrocodone apap. Call when ready for pick up. Last seen 09/02/14 and rx last printed 03/10/15.

## 2015-04-20 ENCOUNTER — Other Ambulatory Visit: Payer: Self-pay | Admitting: Internal Medicine

## 2015-04-20 NOTE — Telephone Encounter (Signed)
Approved: #60 x 0

## 2015-04-20 NOTE — Telephone Encounter (Signed)
03/22/15 

## 2015-04-20 NOTE — Telephone Encounter (Signed)
rx called into pharmacy

## 2015-05-10 ENCOUNTER — Other Ambulatory Visit: Payer: Self-pay

## 2015-05-10 MED ORDER — HYDROCODONE-ACETAMINOPHEN 7.5-325 MG PO TABS: 1.0000 | ORAL_TABLET | Freq: Four times a day (QID) | ORAL | Status: DC | PRN

## 2015-05-10 NOTE — Telephone Encounter (Signed)
Pt left v/m requesting rx hydrocodone apap. Call when ready for pick up. Last printed # 120 on 04/07/15 and last seen 09/02/14.

## 2015-05-10 NOTE — Telephone Encounter (Signed)
Spoke with patient and advised rx ready for pick-up and it will be at the front desk.  

## 2015-05-17 ENCOUNTER — Other Ambulatory Visit: Payer: Self-pay | Admitting: Internal Medicine

## 2015-05-17 ENCOUNTER — Other Ambulatory Visit: Payer: Self-pay

## 2015-05-17 NOTE — Telephone Encounter (Signed)
Approved:okay #60 x 0

## 2015-05-17 NOTE — Telephone Encounter (Signed)
04/20/15 

## 2015-05-17 NOTE — Telephone Encounter (Signed)
rx called into pharmacy

## 2015-06-07 ENCOUNTER — Other Ambulatory Visit: Payer: Self-pay

## 2015-06-07 NOTE — Telephone Encounter (Signed)
Pt left v/m requesting rx hydrocodone apap. Call when ready for pick up. rx last printed # 120 on 05/10/15; last seen 09/02/14.

## 2015-06-08 MED ORDER — HYDROCODONE-ACETAMINOPHEN 7.5-325 MG PO TABS: 1.0000 | ORAL_TABLET | Freq: Four times a day (QID) | ORAL | Status: DC | PRN

## 2015-06-08 NOTE — Telephone Encounter (Signed)
Spoke with patient and advised rx ready for pick-up and it will be at the front desk.  

## 2015-06-22 ENCOUNTER — Other Ambulatory Visit: Payer: Self-pay | Admitting: Internal Medicine

## 2015-06-24 ENCOUNTER — Other Ambulatory Visit: Payer: Self-pay | Admitting: Internal Medicine

## 2015-06-24 NOTE — Telephone Encounter (Signed)
Sent 06/22/15

## 2015-07-02 ENCOUNTER — Other Ambulatory Visit: Payer: Self-pay | Admitting: Internal Medicine

## 2015-07-02 NOTE — Telephone Encounter (Signed)
05/17/2015 

## 2015-07-02 NOTE — Telephone Encounter (Signed)
rx called into pharmacy

## 2015-07-02 NOTE — Telephone Encounter (Signed)
Approved: okay #60 x 0

## 2015-07-09 ENCOUNTER — Encounter: Payer: Self-pay | Admitting: Internal Medicine

## 2015-07-09 ENCOUNTER — Ambulatory Visit (INDEPENDENT_AMBULATORY_CARE_PROVIDER_SITE_OTHER): Payer: Self-pay | Admitting: Internal Medicine

## 2015-07-09 VITALS — BP 148/100 | HR 101 | Temp 97.7°F | Ht 67.0 in | Wt 256.0 lb

## 2015-07-09 DIAGNOSIS — I10 Essential (primary) hypertension: Secondary | ICD-10-CM

## 2015-07-09 DIAGNOSIS — E1140 Type 2 diabetes mellitus with diabetic neuropathy, unspecified: Secondary | ICD-10-CM

## 2015-07-09 DIAGNOSIS — M549 Dorsalgia, unspecified: Secondary | ICD-10-CM

## 2015-07-09 DIAGNOSIS — G8929 Other chronic pain: Secondary | ICD-10-CM

## 2015-07-09 DIAGNOSIS — E1149 Type 2 diabetes mellitus with other diabetic neurological complication: Secondary | ICD-10-CM

## 2015-07-09 LAB — HEMOGLOBIN A1C: HEMOGLOBIN A1C: 8.4 % — AB (ref 4.6–6.5)

## 2015-07-09 LAB — RENAL FUNCTION PANEL
Albumin: 4.2 g/dL (ref 3.5–5.2)
BUN: 14 mg/dL (ref 6–23)
CO2: 24 mEq/L (ref 19–32)
Calcium: 9.3 mg/dL (ref 8.4–10.5)
Chloride: 99 mEq/L (ref 96–112)
Creatinine, Ser: 0.75 mg/dL (ref 0.40–1.50)
GFR: 121.68 mL/min (ref >60.00)
GLUCOSE: 288 mg/dL — AB (ref 70–99)
POTASSIUM: 4 meq/L (ref 3.5–5.1)
Phosphorus: 4 mg/dL (ref 2.3–4.6)
SODIUM: 136 meq/L (ref 135–145)

## 2015-07-09 LAB — MICROALBUMIN / CREATININE URINE RATIO
Creatinine,U: 200.4 mg/dL
MICROALB/CREAT RATIO: 4.6 mg/g (ref 0.0–30.0)
Microalb, Ur: 9.2 mg/dL — ABNORMAL HIGH (ref 0.0–1.9)

## 2015-07-09 MED ORDER — METFORMIN HCL 500 MG PO TABS: 500.0000 mg | ORAL_TABLET | Freq: Three times a day (TID) | ORAL | Status: DC

## 2015-07-09 MED ORDER — HYDROCODONE-ACETAMINOPHEN 7.5-325 MG PO TABS: 1.0000 | ORAL_TABLET | Freq: Four times a day (QID) | ORAL | Status: DC | PRN

## 2015-07-09 NOTE — Progress Notes (Signed)
Pre visit review using our clinic review tool, if applicable. No additional management support is needed unless otherwise documented below in the visit note. 
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
## 2015-07-09 NOTE — Assessment & Plan Note (Signed)
Wants more hydrocodone again--especially with benzo, this is not acceptable

## 2015-07-09 NOTE — Assessment & Plan Note (Signed)
Asked him to check fastings Trying to be careful and weight is down some Didn't tolerate gabapentin--will hold off on other Rx for neuropathy

## 2015-07-09 NOTE — Patient Instructions (Signed)
Please go to the health department for your Tdap vaccine.

## 2015-07-09 NOTE — Assessment & Plan Note (Signed)
BP Readings from Last 3 Encounters:  07/09/15 148/100  09/02/14 120/84  04/08/14 138/88   Up now due to nerves---insurance issue at front desk and is tachycardic No change for now (unless urine microal is positive)

## 2015-08-02 ENCOUNTER — Other Ambulatory Visit: Payer: Self-pay

## 2015-08-02 MED ORDER — ALPRAZOLAM 0.5 MG PO TABS: 0.5000 mg | ORAL_TABLET | Freq: Two times a day (BID) | ORAL | Status: DC

## 2015-08-02 NOTE — Telephone Encounter (Signed)
Approved: okay #60 x 0

## 2015-08-02 NOTE — Telephone Encounter (Signed)
Pt left v/m requesting alprazolam refill to [facility]. Call when refill done. rx last refilled # 60 on 07/02/15 and last annual exam on 07/09/15.

## 2015-08-02 NOTE — Telephone Encounter (Signed)
rx called into pharmacy

## 2015-08-04 ENCOUNTER — Other Ambulatory Visit: Payer: Self-pay

## 2015-08-04 MED ORDER — HYDROCODONE-ACETAMINOPHEN 7.5-325 MG PO TABS: 1.0000 | ORAL_TABLET | Freq: Four times a day (QID) | ORAL | Status: DC | PRN

## 2015-08-04 NOTE — Telephone Encounter (Signed)
Spoke with patient and advised rx ready for pick-up and it will be at the front desk.  

## 2015-08-04 NOTE — Telephone Encounter (Signed)
It is a few days early--he cannot get it early again next month

## 2015-08-04 NOTE — Telephone Encounter (Signed)
Pt left v/m requesting rx hydrocodone apap. Call when ready for pick up. Pt last seen and rx last printed # 120 on 07/09/15.

## 2015-09-01 ENCOUNTER — Other Ambulatory Visit: Payer: Self-pay

## 2015-09-01 NOTE — Telephone Encounter (Signed)
Pt left v/m requesting rx hydrocodone apap. Call when ready for pick up. rx last printed # 120 on 08/04/15. Pt last seen 07/09/15.

## 2015-09-02 MED ORDER — HYDROCODONE-ACETAMINOPHEN 7.5-325 MG PO TABS: 1.0000 | ORAL_TABLET | Freq: Four times a day (QID) | ORAL | Status: DC | PRN

## 2015-09-02 NOTE — Telephone Encounter (Signed)
Could not leave message on machine, VM was full, will leave rx up front for pick-up

## 2015-09-13 ENCOUNTER — Other Ambulatory Visit: Payer: Self-pay | Admitting: Internal Medicine

## 2015-09-13 NOTE — Telephone Encounter (Signed)
08/02/2015 

## 2015-09-14 ENCOUNTER — Other Ambulatory Visit: Payer: Self-pay | Admitting: Internal Medicine

## 2015-09-14 NOTE — Telephone Encounter (Signed)
Approved: #60 x 0

## 2015-09-14 NOTE — Telephone Encounter (Signed)
rx called into pharmacy

## 2015-09-30 ENCOUNTER — Other Ambulatory Visit: Payer: Self-pay

## 2015-09-30 MED ORDER — HYDROCODONE-ACETAMINOPHEN 7.5-325 MG PO TABS: 1.0000 | ORAL_TABLET | Freq: Four times a day (QID) | ORAL | Status: DC | PRN

## 2015-09-30 NOTE — Telephone Encounter (Signed)
Pt left v/m requesting rx hydrocodone apap. Call when ready for pick up. rx last printed # 120 on 09/02/15. Last seen 07/09/15. Pt does not need to pick up until 10/04/15.

## 2015-10-01 NOTE — Telephone Encounter (Signed)
Spoke with patient and advised rx ready for pick-up and it will be at the front desk.  

## 2015-10-05 ENCOUNTER — Encounter: Payer: Self-pay | Admitting: Internal Medicine

## 2015-10-13 ENCOUNTER — Other Ambulatory Visit: Payer: Self-pay | Admitting: Internal Medicine

## 2015-10-13 NOTE — Telephone Encounter (Signed)
Last filled #60 09/14/15

## 2015-10-16 NOTE — Telephone Encounter (Signed)
Approved: #60 x 0

## 2015-10-18 ENCOUNTER — Other Ambulatory Visit: Payer: Self-pay | Admitting: Internal Medicine

## 2015-10-22 ENCOUNTER — Encounter: Payer: Self-pay | Admitting: Internal Medicine

## 2015-11-01 ENCOUNTER — Other Ambulatory Visit: Payer: Self-pay

## 2015-11-01 MED ORDER — HYDROCODONE-ACETAMINOPHEN 7.5-325 MG PO TABS: 1.0000 | ORAL_TABLET | Freq: Four times a day (QID) | ORAL | Status: DC | PRN

## 2015-11-01 NOTE — Telephone Encounter (Signed)
Pt left v/m requesting rx hydrocodone apap. Call when ready for pick up. Last printed # 120 on 09/30/15. Last seen 07/09/15.

## 2015-11-01 NOTE — Telephone Encounter (Signed)
Spoke with patient and advised rx ready for pick-up and it will be at the front desk.  

## 2015-11-17 ENCOUNTER — Other Ambulatory Visit: Payer: Self-pay | Admitting: Internal Medicine

## 2015-11-18 ENCOUNTER — Other Ambulatory Visit: Payer: Self-pay | Admitting: Internal Medicine

## 2015-11-18 NOTE — Telephone Encounter (Signed)
10/18/2015 

## 2015-11-18 NOTE — Telephone Encounter (Signed)
rx called into pharmacy

## 2015-11-18 NOTE — Telephone Encounter (Signed)
Approved: #60 x 0

## 2015-11-30 ENCOUNTER — Other Ambulatory Visit: Payer: Self-pay

## 2015-11-30 MED ORDER — HYDROCODONE-ACETAMINOPHEN 7.5-325 MG PO TABS: 1.0000 | ORAL_TABLET | Freq: Four times a day (QID) | ORAL | Status: DC | PRN

## 2015-11-30 NOTE — Telephone Encounter (Signed)
Pt left v/m requesting rx hydrocodone apap. Call when ready for pick up. rx last printed # 120 on 11/01/15. Last annual 07/09/15.

## 2015-11-30 NOTE — Telephone Encounter (Signed)
Could not leave a message the mailbox was full, will put rx up front for patient to pick-up.

## 2015-12-15 ENCOUNTER — Telehealth: Payer: Self-pay | Admitting: Internal Medicine

## 2015-12-15 ENCOUNTER — Other Ambulatory Visit: Payer: Self-pay | Admitting: Internal Medicine

## 2015-12-15 NOTE — Telephone Encounter (Signed)
11/18/2015 

## 2015-12-15 NOTE — Telephone Encounter (Signed)
Patient is requesting to transfer from [facility] to [place].  Patient wants to keep next appointment with Silvio Pate but is wanting to move to [facility] location because of location.  Please advice.

## 2015-12-15 NOTE — Telephone Encounter (Signed)
rx called into pharmacy

## 2015-12-15 NOTE — Telephone Encounter (Signed)
Approved: #60 x 0

## 2015-12-16 NOTE — Telephone Encounter (Signed)
That is fine with me.

## 2015-12-16 NOTE — Telephone Encounter (Signed)
Tried to reach patient to notify but mailbox was full.

## 2015-12-16 NOTE — Telephone Encounter (Signed)
Not able to take elective transfers at this time due to high volume from retiring providers at our location. If desired recommend call back in 6 months or try another provider at the office.

## 2015-12-27 ENCOUNTER — Other Ambulatory Visit: Payer: Self-pay

## 2015-12-27 MED ORDER — HYDROCODONE-ACETAMINOPHEN 7.5-325 MG PO TABS: 1.0000 | ORAL_TABLET | Freq: Four times a day (QID) | ORAL | Status: DC | PRN

## 2015-12-27 NOTE — Telephone Encounter (Signed)
Pt left v/m requesting rx hydrocodone apap. Call when ready for pick up. Last printed # 120 on 11/30/15 and last annual exam on 07/09/15.

## 2015-12-28 NOTE — Telephone Encounter (Signed)
Spoke with patient and advised rx ready for pick-up and it will be at the front desk.  

## 2016-01-12 ENCOUNTER — Other Ambulatory Visit: Payer: Self-pay | Admitting: Internal Medicine

## 2016-01-12 NOTE — Telephone Encounter (Signed)
rx called into pharmacy

## 2016-01-12 NOTE — Telephone Encounter (Signed)
12/15/15 

## 2016-01-12 NOTE — Telephone Encounter (Signed)
Approved: #60 x 0

## 2016-01-24 ENCOUNTER — Other Ambulatory Visit: Payer: Self-pay

## 2016-01-24 MED ORDER — HYDROCODONE-ACETAMINOPHEN 7.5-325 MG PO TABS: 1.0000 | ORAL_TABLET | Freq: Four times a day (QID) | ORAL | Status: DC | PRN

## 2016-01-24 NOTE — Telephone Encounter (Signed)
Please have him set up appt in next month or so. I don't want to wait a year with his diabetes not being controlled

## 2016-01-24 NOTE — Telephone Encounter (Signed)
Pt left v/m requesting rx hydrocodone apap. Call when ready for pick up. rx last printed # 120 on 12/27/15; last seen 07/09/15.

## 2016-01-24 NOTE — Telephone Encounter (Signed)
Tried to call patient. Voice mail is full. Will try again tomorrow.

## 2016-01-25 NOTE — Telephone Encounter (Signed)
Spoke to pt. He will schedule appointment when he picks up the rx. Rx up front to pick up

## 2016-01-26 ENCOUNTER — Ambulatory Visit: Payer: Self-pay | Admitting: Internal Medicine

## 2016-02-16 ENCOUNTER — Other Ambulatory Visit: Payer: Self-pay | Admitting: Internal Medicine

## 2016-02-16 NOTE — Telephone Encounter (Signed)
Left refill on voice mail at pharmacy

## 2016-02-16 NOTE — Telephone Encounter (Signed)
Approved: #60 x 0

## 2016-02-16 NOTE — Telephone Encounter (Signed)
Last filled 01-12-16 #60/0 Last OV 07-09-15 Next OV 02-24-16

## 2016-02-24 ENCOUNTER — Other Ambulatory Visit: Payer: Self-pay | Admitting: Internal Medicine

## 2016-02-24 ENCOUNTER — Ambulatory Visit (INDEPENDENT_AMBULATORY_CARE_PROVIDER_SITE_OTHER): Payer: Self-pay | Admitting: Internal Medicine

## 2016-02-24 ENCOUNTER — Encounter: Payer: Self-pay | Admitting: Internal Medicine

## 2016-02-24 VITALS — BP 138/100 | HR 115 | Temp 98.1°F | Wt 254.0 lb

## 2016-02-24 DIAGNOSIS — I10 Essential (primary) hypertension: Secondary | ICD-10-CM

## 2016-02-24 DIAGNOSIS — G8929 Other chronic pain: Secondary | ICD-10-CM

## 2016-02-24 DIAGNOSIS — M549 Dorsalgia, unspecified: Secondary | ICD-10-CM

## 2016-02-24 DIAGNOSIS — E1149 Type 2 diabetes mellitus with other diabetic neurological complication: Secondary | ICD-10-CM

## 2016-02-24 LAB — HEMOGLOBIN A1C: HEMOGLOBIN A1C: 8.7 % — AB (ref 4.6–6.5)

## 2016-02-24 MED ORDER — BISOPROLOL-HYDROCHLOROTHIAZIDE 5-6.25 MG PO TABS: 1.0000 | ORAL_TABLET | Freq: Every day | ORAL | Status: DC

## 2016-02-24 MED ORDER — HYDROCODONE-ACETAMINOPHEN 7.5-325 MG PO TABS: 1.0000 | ORAL_TABLET | Freq: Four times a day (QID) | ORAL | Status: DC | PRN

## 2016-02-24 NOTE — Progress Notes (Signed)
   Subjective:    Patient ID: John Rhodes, male    DOB: 07-15-1976, 40 y.o.   MRN: [id number]  HPI Here for follow up of diabetes and HTN  He is "about the same" Avoids sweets, bread and soft drinks Able to walk a little---like in his yard or walking in mall Weight down 2# Rarely checks sugars--- but post prandial (and can be 400!)  No chest pain  No SOB Did have spell of chest pain while weed eating-- about 6 months ago Call 911. EMTs checked him---didn't go to ER (they felt it might just be heartburn) No recurrence  No dizziness or syncope---does get hot or sweaty with work in yard, then will be slightly lightheaded  Current Outpatient Prescriptions on File Prior to Visit  Medication Sig Dispense Refill  . ALPRAZolam (XANAX) 0.5 MG tablet TAKE ONE TABLET BY MOUTH TWICE DAILY 60 tablet 0  . HYDROcodone-acetaminophen (NORCO) 7.5-325 MG tablet Take 1 tablet by mouth every 6 (six) hours as needed. 120 tablet 0  . lisinopril-hydrochlorothiazide (PRINZIDE,ZESTORETIC) 20-12.5 MG per tablet Take 2 tablets by mouth daily. 90 tablet 11  . metFORMIN (GLUCOPHAGE) 500 MG tablet Take 1 tablet (500 mg total) by mouth 3 (three) times daily. 90 tablet 11  . sertraline (ZOLOFT) 100 MG tablet Take 1.5 tablets (150 mg total) by mouth daily. (Patient taking differently: Take 100 mg by mouth daily. ) 45 tablet 12   No current facility-administered medications on file prior to visit.    Allergies  Allergen Reactions  . Penicillins     Past Medical History  Diagnosis Date  . Diabetes mellitus without complication ([place])   . Hypertension   . History of thumb surgery   . Chronic back pain   . Anxiety     Past Surgical History  Procedure Laterality Date  . Arm surgery      No family history on file.  Social History   Social History  . Marital Status: Single    Spouse Name: N/A  . Number of Children: 0  . Years of Education: 12   Occupational History  . Unemployed since 2012    filed for disability   Social History Main Topics  . Smoking status: Never Smoker   . Smokeless tobacco: Current User    Types: Snuff  . Alcohol Use: 0.0 oz/week    0 Standard drinks or equivalent per week  . Drug Use: No  . Sexual Activity:    Partners: Female   Other Topics Concern  . Not on file   Social History Narrative   Lives with John Rhodes initiating sleep--but then sleeps okay John Rhodes has respiratory failure--- on oxygen. He has to take care of her     Objective:   Physical Exam  Constitutional: He appears well-developed and well-nourished. No distress.  Neck: Normal range of motion. Neck supple.  Cardiovascular: Regular rhythm.   Rate still up--not nervous  Pulmonary/Chest: Effort normal and breath sounds normal. No respiratory distress. He has no wheezes. He has no rales.  Musculoskeletal: He exhibits no edema.  Lymphadenopathy:    He has no cervical adenopathy.  Psychiatric: He has a normal mood and affect. His behavior is normal.          Assessment & Plan:

## 2016-02-24 NOTE — Assessment & Plan Note (Signed)
Still seems to be too high Will check A1c and add glipizide 5 bid if over 8% still May need to consider lantus---but discussed lifestyle

## 2016-02-24 NOTE — Progress Notes (Signed)
Pre visit review using our clinic review tool, if applicable. No additional management support is needed unless otherwise documented below in the visit note. 

## 2016-02-24 NOTE — Assessment & Plan Note (Signed)
Checked state registry No other prescribers He asks for more---declined

## 2016-02-24 NOTE — Assessment & Plan Note (Signed)
BP Readings from Last 3 Encounters:  02/24/16 138/100  07/09/15 148/100  09/02/14 120/84   148/100 on right Will add ziac

## 2016-02-25 ENCOUNTER — Telehealth: Payer: Self-pay

## 2016-02-25 MED ORDER — GLIPIZIDE 5 MG PO TABS: 5.0000 mg | ORAL_TABLET | Freq: Two times a day (BID) | ORAL | Status: DC

## 2016-02-25 NOTE — Telephone Encounter (Signed)
Spoke to patient about labs. Added glipizide to medication list and did rx

## 2016-02-25 NOTE — Telephone Encounter (Signed)
-----   Message from Venia Carbon, MD sent at 02/25/2016  7:12 AM EDT ----- Please call him The diabetes control has worsened---so as we planned, I would like to start him on glipizide 79m bid (send #[phone] x 0) He should let me know if he has any problems with this, or the new blood pressure medicine

## 2016-03-20 ENCOUNTER — Other Ambulatory Visit: Payer: Self-pay | Admitting: Internal Medicine

## 2016-03-20 NOTE — Telephone Encounter (Signed)
Left refill on voice mail at pharmacy

## 2016-03-20 NOTE — Telephone Encounter (Signed)
Approved: #60 x 0

## 2016-03-20 NOTE — Telephone Encounter (Signed)
Last filled 02-16-16 #60 Last OV 02-24-16 No Future OV

## 2016-03-21 ENCOUNTER — Other Ambulatory Visit: Payer: Self-pay

## 2016-03-21 MED ORDER — HYDROCODONE-ACETAMINOPHEN 7.5-325 MG PO TABS: 1.0000 | ORAL_TABLET | Freq: Four times a day (QID) | ORAL | Status: DC | PRN

## 2016-03-21 NOTE — Telephone Encounter (Signed)
Advised pt rx was up front ready for pickup

## 2016-03-21 NOTE — Telephone Encounter (Signed)
Pt left v/m requesting rx hydrocodone apap. Call when ready for pick up. Pt last seen and rx last printed # 120 on 02/24/16. Pt would like to pick up on 03/23/16 or 03/24/16.

## 2016-04-19 ENCOUNTER — Telehealth: Payer: Self-pay | Admitting: Internal Medicine

## 2016-04-19 ENCOUNTER — Other Ambulatory Visit: Payer: Self-pay

## 2016-04-19 ENCOUNTER — Other Ambulatory Visit: Payer: Self-pay | Admitting: Internal Medicine

## 2016-04-19 MED ORDER — HYDROCODONE-ACETAMINOPHEN 7.5-325 MG PO TABS: 1.0000 | ORAL_TABLET | Freq: Four times a day (QID) | ORAL | Status: DC | PRN

## 2016-04-19 NOTE — Telephone Encounter (Signed)
Pt left v/m requesting rx hydrocodone apap. Call when ready for pick up. Pt last seen 02/24/16 and rx last printed # 120 on 03/21/16. Pt would like to pick up on 04/21/16.

## 2016-04-19 NOTE — Telephone Encounter (Signed)
Tried to call pt, no DPR and voice mail was full. His hydrocodone rx is up front for pickup

## 2016-04-19 NOTE — Telephone Encounter (Signed)
Last filled 03-20-16 #60 Last OV 02/24/16

## 2016-04-20 ENCOUNTER — Other Ambulatory Visit: Payer: Self-pay | Admitting: Internal Medicine

## 2016-04-20 MED ORDER — ALPRAZOLAM 0.5 MG PO TABS: 0.5000 mg | ORAL_TABLET | Freq: Two times a day (BID) | ORAL | Status: DC

## 2016-04-20 NOTE — Telephone Encounter (Signed)
Approved: #60 x 0

## 2016-04-20 NOTE — Telephone Encounter (Signed)
Spoke to patient

## 2016-04-21 ENCOUNTER — Telehealth: Payer: Self-pay | Admitting: Internal Medicine

## 2016-04-21 NOTE — Telephone Encounter (Signed)
Pt came in today to pick up rx He wanted to let you know that with new medication for his dm is working great His blood sugar readings are around 100 now instead of 500 He stated he is feeling much better

## 2016-04-21 NOTE — Telephone Encounter (Signed)
That is good to hear

## 2016-05-17 ENCOUNTER — Other Ambulatory Visit: Payer: Self-pay

## 2016-05-17 MED ORDER — HYDROCODONE-ACETAMINOPHEN 7.5-325 MG PO TABS: 1.0000 | ORAL_TABLET | Freq: Four times a day (QID) | ORAL | Status: DC | PRN

## 2016-05-17 NOTE — Telephone Encounter (Signed)
Pt left v/m requesting rx hydrocodone apap. Call when ready for pick up. rx last printed # 120 on 04/19/16; last seen 02/24/16.

## 2016-05-17 NOTE — Telephone Encounter (Signed)
Spoke to pt. He just wanted to make sure the rx was ready for pickup on Friday. He will not be getting it before then. Rx up front now

## 2016-05-17 NOTE — Telephone Encounter (Signed)
3 days early--make sure he knows not to be early again next month

## 2016-05-18 ENCOUNTER — Other Ambulatory Visit: Payer: Self-pay | Admitting: Internal Medicine

## 2016-05-18 NOTE — Telephone Encounter (Signed)
Called in rx on voice mail at pharmacy

## 2016-05-18 NOTE — Telephone Encounter (Signed)
Approved: #60 x 0 Needs follow up in October

## 2016-05-18 NOTE — Telephone Encounter (Signed)
Last filled 04-20-16 #60 Last OV 02-24-16 No Future OV

## 2016-06-14 ENCOUNTER — Other Ambulatory Visit: Payer: Self-pay

## 2016-06-14 MED ORDER — HYDROCODONE-ACETAMINOPHEN 7.5-325 MG PO TABS: 1.0000 | ORAL_TABLET | Freq: Four times a day (QID) | ORAL | 0 refills | Status: DC | PRN

## 2016-06-14 NOTE — Telephone Encounter (Signed)
Pt left v/m requesting rx hydrocodone apap. Call when ready for pick up. rx last printed # 120 on 05/17/16. Last seen 02/24/2016. Pt request to pick up on 06/16/16. Dr Silvio Pate out of office until 06/19/16. Sending to provider in office.

## 2016-06-14 NOTE — Telephone Encounter (Signed)
Pt notified Rx ready for pickup 

## 2016-06-14 NOTE — Telephone Encounter (Signed)
Refilled times one in PCP absence  Px printed for pick up in IN box

## 2016-06-26 ENCOUNTER — Other Ambulatory Visit: Payer: Self-pay | Admitting: Internal Medicine

## 2016-06-27 NOTE — Telephone Encounter (Signed)
Approved: #60 x 0 Needs diabetes follow up in October

## 2016-06-27 NOTE — Telephone Encounter (Signed)
Last filled 05-18-16 #60 Last OV 02-24-16 No Future OV

## 2016-06-27 NOTE — Telephone Encounter (Signed)
Left refill on voice mail at pharmacy

## 2016-06-28 NOTE — Telephone Encounter (Signed)
Spoke to pt. He will call soon to schedule appt.

## 2016-07-12 ENCOUNTER — Telehealth: Payer: Self-pay | Admitting: Internal Medicine

## 2016-07-12 MED ORDER — HYDROCODONE-ACETAMINOPHEN 7.5-325 MG PO TABS: 1.0000 | ORAL_TABLET | Freq: Four times a day (QID) | ORAL | 0 refills | Status: DC | PRN

## 2016-07-12 NOTE — Telephone Encounter (Signed)
Pt called to request a refill on his Hydrocone which will be out on Friday.  He uses the [facility] on [address]. Can you please refill if appropriate. Thanks!

## 2016-07-13 NOTE — Telephone Encounter (Signed)
Last written 06-14-16. Last OV 02-24-16 Next OV 08-25-16

## 2016-07-13 NOTE — Telephone Encounter (Signed)
Okay to prepare for my signature

## 2016-07-13 NOTE — Telephone Encounter (Signed)
Spoke to pt. RX up front for pickup

## 2016-07-26 ENCOUNTER — Other Ambulatory Visit: Payer: Self-pay | Admitting: Internal Medicine

## 2016-07-26 NOTE — Telephone Encounter (Signed)
Last filled 06-27-16 #60 Last OV 02-24-16 Next OV 08-25-16

## 2016-07-26 NOTE — Telephone Encounter (Signed)
Approved: #60 x 0

## 2016-07-26 NOTE — Telephone Encounter (Signed)
Left refill on voice mail at pharmacy

## 2016-08-09 ENCOUNTER — Other Ambulatory Visit: Payer: Self-pay

## 2016-08-09 MED ORDER — HYDROCODONE-ACETAMINOPHEN 7.5-325 MG PO TABS: 1.0000 | ORAL_TABLET | Freq: Four times a day (QID) | ORAL | 0 refills | Status: DC | PRN

## 2016-08-09 NOTE — Telephone Encounter (Signed)
Tried to call pt, no answer. VM full. Rx is up front ready for pickup

## 2016-08-09 NOTE — Telephone Encounter (Signed)
Pt left v/m requesting rx hydrocodone apap. Call when ready for pick up. Last printed # 120 on 07/12/16; last seen 02/24/16. Does not need to pick up until 08/11/16.

## 2016-08-24 ENCOUNTER — Other Ambulatory Visit: Payer: Self-pay | Admitting: Internal Medicine

## 2016-08-25 ENCOUNTER — Encounter: Payer: Self-pay | Admitting: Internal Medicine

## 2016-08-25 ENCOUNTER — Ambulatory Visit (INDEPENDENT_AMBULATORY_CARE_PROVIDER_SITE_OTHER): Payer: Self-pay | Admitting: Internal Medicine

## 2016-08-25 VITALS — BP 138/90 | HR 118 | Temp 97.8°F | Wt 267.0 lb

## 2016-08-25 DIAGNOSIS — M549 Dorsalgia, unspecified: Secondary | ICD-10-CM

## 2016-08-25 DIAGNOSIS — E1149 Type 2 diabetes mellitus with other diabetic neurological complication: Secondary | ICD-10-CM

## 2016-08-25 DIAGNOSIS — R079 Chest pain, unspecified: Secondary | ICD-10-CM

## 2016-08-25 DIAGNOSIS — G8929 Other chronic pain: Secondary | ICD-10-CM

## 2016-08-25 LAB — COMPREHENSIVE METABOLIC PANEL
ALT: 36 U/L (ref 0–53)
AST: 29 U/L (ref 0–37)
Albumin: 4.2 g/dL (ref 3.5–5.2)
Alkaline Phosphatase: 66 U/L (ref 39–117)
BILIRUBIN TOTAL: 0.4 mg/dL (ref 0.2–1.2)
BUN: 14 mg/dL (ref 6–23)
CALCIUM: 10 mg/dL (ref 8.4–10.5)
CHLORIDE: 98 meq/L (ref 96–112)
CO2: 25 meq/L (ref 19–32)
Creatinine, Ser: 0.83 mg/dL (ref 0.40–1.50)
GFR: 107.66 mL/min (ref >60.00)
GLUCOSE: 243 mg/dL — AB (ref 70–99)
Potassium: 3.8 mEq/L (ref 3.5–5.1)
Sodium: 137 mEq/L (ref 135–145)
Total Protein: 7.7 g/dL (ref 6.0–8.3)

## 2016-08-25 LAB — CBC WITH DIFFERENTIAL/PLATELET
BASOS PCT: 0.5 % (ref 0.0–3.0)
Basophils Absolute: 0.1 10*3/uL (ref 0.0–0.1)
EOS ABS: 0.3 10*3/uL (ref 0.0–0.7)
Eosinophils Relative: 2.9 % (ref 0.0–5.0)
HCT: 48 % (ref 39.0–52.0)
Hemoglobin: 16.3 g/dL (ref 13.0–17.0)
LYMPHS PCT: 27.8 % (ref 12.0–46.0)
Lymphs Abs: 3.4 10*3/uL (ref 0.7–4.0)
MCHC: 34 g/dL (ref 30.0–36.0)
MCV: 90.1 fl (ref 78.0–100.0)
MONO ABS: 0.8 10*3/uL (ref 0.1–1.0)
MONOS PCT: 6.9 % (ref 3.0–12.0)
NEUTROS ABS: 7.5 10*3/uL (ref 1.4–7.7)
Neutrophils Relative %: 61.9 % (ref 43.0–77.0)
PLATELETS: 223 10*3/uL (ref 150.0–400.0)
RBC: 5.32 Mil/uL (ref 4.22–5.81)
RDW: 13.4 % (ref 11.5–15.5)
WBC: 12.1 10*3/uL — ABNORMAL HIGH (ref 4.0–10.5)

## 2016-08-25 LAB — HEMOGLOBIN A1C: HEMOGLOBIN A1C: 8.1 % — AB (ref 4.6–6.5)

## 2016-08-25 MED ORDER — GABAPENTIN 300 MG PO CAPS: 300.0000 mg | ORAL_CAPSULE | Freq: Three times a day (TID) | ORAL | 11 refills | Status: DC

## 2016-08-25 MED ORDER — ALPRAZOLAM 0.5 MG PO TABS: 0.5000 mg | ORAL_TABLET | Freq: Two times a day (BID) | ORAL | 0 refills | Status: DC

## 2016-08-25 NOTE — Assessment & Plan Note (Signed)
Checked CSRS--no other prescribers Continue regimen

## 2016-08-25 NOTE — Assessment & Plan Note (Signed)
Seems to have much better control Will check labs Gabapentin for the neuropathy (try it)

## 2016-08-25 NOTE — Progress Notes (Signed)
Subjective:    Patient ID: John Rhodes, male    DOB: 04-Feb-1976, 40 y.o.   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 ALPRAZolam (XANAX) 0.5 MG tablet TAKE ONE TABLET BY MOUTH TWICE DAILY 60 tablet 0  . bisoprolol-hydrochlorothiazide (ZIAC) 5-6.25 MG tablet Take 1 tablet by mouth daily. 90 tablet 3  . glipiZIDE (GLUCOTROL) 5 MG tablet Take 1 tablet (5 mg total) by mouth 2 (two) times daily before a meal. 60 tablet 5  . HYDROcodone-acetaminophen (NORCO) 7.5-325 MG tablet Take 1 tablet by mouth every 6 (six) hours as needed. 120 tablet 0  . lisinopril-hydrochlorothiazide (PRINZIDE,ZESTORETIC) 20-12.5 MG tablet TAKE TWO TABLETS BY MOUTH ONCE DAILY 180 tablet 3  . metFORMIN (GLUCOPHAGE) 500 MG tablet Take 1 tablet (500 mg total) by mouth 3 (three) times daily. 90 tablet 11  . sertraline (ZOLOFT) 100 MG tablet Take 1.5 tablets (150 mg total) by mouth daily. (Patient taking differently: Take 100 mg by mouth daily. ) 45 tablet 12   No current facility-administered medications on file prior to visit.     Allergies  Allergen Reactions  . Penicillins     Past Medical History:  Diagnosis Date  . Anxiety   . Chronic back pain   . Diabetes mellitus without  complication ([place])   . History of thumb surgery   . Hypertension     Past Surgical History:  Procedure Laterality Date  . arm surgery      No family history on file.  Social History   Social History  . Marital status: Single    Spouse name: N/A  . Number of children: 0  . Years of education: 12   Occupational History  . Unemployed since 2012     filed for disability   Social History Main Topics  . Smoking status: Never Smoker  . Smokeless tobacco: Current User    Types: Snuff  . Alcohol use 0.0 oz/week  . Drug use: No  . Sexual activity: Yes    Partners: Female   Other Topics Concern  . Not on file   Social History Narrative   Lives with fiancee-- Loma Sousa   Review of Systems Weight up 13#---eating about the same though (?from glipizide) Stress with fiancee--may need heart transplant    Objective:   Physical Exam  Constitutional: He appears well-developed. No distress.  Neck: Normal range of motion. Neck supple. No thyromegaly present.  Cardiovascular: Normal rate, regular rhythm, normal heart sounds and intact distal pulses.  Exam reveals no gallop.   No murmur heard. Pulmonary/Chest: Effort normal and breath sounds normal. No respiratory distress. He has no wheezes. He has no rales.  Abdominal: Soft. There is no tenderness.  Lymphadenopathy:  He has no cervical adenopathy.  Neurological:  Decreased sensation in feet  Skin:  No foot lesions  Psychiatric: He has a normal mood and affect. His behavior is normal.          Assessment & Plan:

## 2016-08-25 NOTE — Assessment & Plan Note (Signed)
Classic symptoms of exertional angina No insurance is a problem but needs cardiology evaluation (it is not new--so seems like stable pattern)

## 2016-08-25 NOTE — Progress Notes (Signed)
Pre visit review using our clinic review tool, if applicable. No additional management support is needed unless otherwise documented below in the visit note. 

## 2016-09-06 ENCOUNTER — Other Ambulatory Visit: Payer: Self-pay

## 2016-09-06 NOTE — Telephone Encounter (Signed)
Pt left v/m requesting rx hydrocodone apap. Call when ready for pick up. Last printed # 120 on 08/09/16 and last seen 08/25/16.

## 2016-09-07 MED ORDER — HYDROCODONE-ACETAMINOPHEN 7.5-325 MG PO TABS: 1.0000 | ORAL_TABLET | Freq: Four times a day (QID) | ORAL | 0 refills | Status: DC | PRN

## 2016-09-07 NOTE — Telephone Encounter (Signed)
Spoke to pt. Rx up front for pickup 

## 2016-09-21 ENCOUNTER — Other Ambulatory Visit: Payer: Self-pay | Admitting: Internal Medicine

## 2016-09-22 NOTE — Telephone Encounter (Signed)
Left refill on voice mail at pharmacy

## 2016-09-22 NOTE — Telephone Encounter (Signed)
Last filled 08-25-16 #60 Last OV 08-25-16 Next OV 02-23-17

## 2016-09-22 NOTE — Telephone Encounter (Signed)
Approved: #60 x 0

## 2016-09-23 ENCOUNTER — Other Ambulatory Visit: Payer: Self-pay | Admitting: Internal Medicine

## 2016-09-28 ENCOUNTER — Ambulatory Visit (INDEPENDENT_AMBULATORY_CARE_PROVIDER_SITE_OTHER): Payer: Self-pay | Admitting: Interventional Cardiology

## 2016-09-28 ENCOUNTER — Encounter: Payer: Self-pay | Admitting: Interventional Cardiology

## 2016-09-28 VITALS — BP 160/110 | HR 108 | Ht 66.0 in | Wt 272.0 lb

## 2016-09-28 DIAGNOSIS — R0789 Other chest pain: Secondary | ICD-10-CM

## 2016-09-28 DIAGNOSIS — I209 Angina pectoris, unspecified: Secondary | ICD-10-CM

## 2016-09-28 DIAGNOSIS — Z7689 Persons encountering health services in other specified circumstances: Secondary | ICD-10-CM

## 2016-09-28 DIAGNOSIS — R55 Syncope and collapse: Secondary | ICD-10-CM

## 2016-09-28 DIAGNOSIS — Z01810 Encounter for preprocedural cardiovascular examination: Secondary | ICD-10-CM

## 2016-09-28 MED ORDER — ASPIRIN EC 81 MG PO TBEC: 81.0000 mg | DELAYED_RELEASE_TABLET | Freq: Every day | ORAL | 3 refills | Status: DC

## 2016-09-28 MED ORDER — NITROGLYCERIN 0.4 MG SL SUBL: 0.4000 mg | SUBLINGUAL_TABLET | SUBLINGUAL | 3 refills | Status: DC | PRN

## 2016-09-28 NOTE — Patient Instructions (Addendum)
**Note De-Identified Ethyle Tiedt Obfuscation** Medication Instructions:  Start taking Aspirin 81 mg daily and Nitroglycerin as need for chest pain. All other medications remain the same.  Labwork: On 10/06/17 anytime between 7:30 and 5:00.  Testing/Procedures: Your physician has requested that you have an echocardiogram. Echocardiography is a painless test that uses sound waves to create images of your heart. It provides your doctor with information about the size and shape of your heart and how well your heart's chambers and valves are working. This procedure takes approximately one hour. There are no restrictions for this procedure.  Your physician has requested that you have a cardiac catheterization. Cardiac catheterization is used to diagnose and/or treat various heart conditions. Doctors may recommend this procedure for a number of different reasons. The most common reason is to evaluate chest pain. Chest pain can be a symptom of coronary artery disease (CAD), and cardiac catheterization can show whether plaque is narrowing or blocking your heart's arteries. This procedure is also used to evaluate the valves, as well as measure the blood flow and oxygen levels in different parts of your heart. For further information please visit [email or web address]. Please follow instruction sheet, as given.  Follow-Up: After Cath     If you need a refill on your cardiac medications before your next appointment, please call your pharmacy.

## 2016-09-28 NOTE — Progress Notes (Signed)
Cardiology Office Note   Date:  09/28/2016   ID:  John Rhodes, DOB 1976/01/10, MRN [id number]  PCP:  John Simpler, MD    Chief Complaint  Patient presents with  . Establish Care  . Chest Pain     Wt Readings from Last 3 Encounters:  09/28/16 123.4 kg (272 lb)  08/25/16 121.1 kg (267 lb)  02/24/16 115.2 kg (254 lb)       History of Present Illness: John Rhodes is a 40 y.o. male  Who has had issues the exertion.  He has noticed that he breaks into a cold sweat, then has pressure in his chest and thenlightheaded.  If he does not sit, he will pass out.  He has passed out four times.  This has been going on intermittently for 6 months.  He called EMS once.  THey advised that he go to the hospital but he declined.    Both parents have had MIs.  Father died at age 58 from heart disease.  Mother had a stroke.  Father had 3 MIs before he turned 1.    No siblings.  His prior PMD had recommended a heart cath and the patient would like to have this done.    The patient has reduced activity over the past few months to help avoid the chest discomfort amd syncope. Last syncopal spell was in June.    Past Medical History:  Diagnosis Date  . Anxiety   . Chronic back pain   . Diabetes mellitus without complication ([place])   . History of thumb surgery   . Hypertension     Past Surgical History:  Procedure Laterality Date  . arm surgery       Current Outpatient Prescriptions  Medication Sig Dispense Refill  . ALPRAZolam (XANAX) 0.5 MG tablet TAKE ONE TABLET BY MOUTH TWICE DAILY 60 tablet 0  . bisoprolol-hydrochlorothiazide (ZIAC) 5-6.25 MG tablet Take 1 tablet by mouth daily. 90 tablet 3  . gabapentin (NEURONTIN) 300 MG capsule Take 1-2 capsules (300-600 mg total) by mouth 3 (three) times daily. 60 capsule 11  . glipiZIDE (GLUCOTROL) 5 MG tablet Take 1 tablet (5 mg total) by mouth 2 (two) times daily before a meal. 60 tablet 5  . HYDROcodone-acetaminophen (NORCO)  7.5-325 MG tablet Take 1 tablet by mouth every 6 (six) hours as needed. 120 tablet 0  . lisinopril-hydrochlorothiazide (PRINZIDE,ZESTORETIC) 20-12.5 MG tablet TAKE TWO TABLETS BY MOUTH ONCE DAILY 180 tablet 3  . metFORMIN (GLUCOPHAGE) 500 MG tablet TAKE ONE TABLET BY MOUTH THREE TIMES DAILY 90 tablet 3  . sertraline (ZOLOFT) 100 MG tablet Take 1.5 tablets (150 mg total) by mouth daily. 45 tablet 12   No current facility-administered medications for this visit.     Allergies:   Penicillins    Social History:  The patient  reports that he has never smoked. His smokeless tobacco use includes Snuff. He reports that he drinks alcohol. He reports that he does not use drugs.   Family History:  The patient's family history includes Heart attack in his father and mother; Stroke in his mother.    ROS:  Please see the history of present illness.   Otherwise, review of systems are positive for chest pain and syncope, although sx have been less over the past few months since he has reduced activity.   All other systems are reviewed and negative.    PHYSICAL EXAM: VS:  BP (!) 160/110 (BP Location: Right Arm, Patient  Position: Sitting, Cuff Size: Large)   Pulse (!) 108   Ht 5' 6"  (1.676 m)   Wt 123.4 kg (272 lb)   SpO2 98%   BMI 43.90 kg/m  , BMI Body mass index is 43.9 kg/m. GEN: Well nourished, well developed, in no acute distress  HEENT: normal  Neck: no JVD, carotid bruits, or masses Cardiac: RRR; no murmurs, rubs, or gallops,no edema ; bilateral radial pulses are equal; 2+ despite BP difference Respiratory:  clear to auscultation bilaterally, normal work of breathing GI: soft, nontender, nondistended, + BS MS: no deformity or atrophy  Skin: warm and dry, no rash Neuro:  Strength and sensation are intact Psych: euthymic mood, full affect   EKG:   The ekg ordered today demonstrates NSR, inferior Q waves, nonspecific ST segment flattening   Recent Labs: 08/25/2016: ALT 36; BUN 14;  Creatinine, Ser 0.83; Hemoglobin 16.3; Platelets 223.0; Potassium 3.8; Sodium 137   Lipid Panel    Component Value Date/Time   CHOL 193 09/02/2014 1115   TRIG 355.0 (H) 09/02/2014 1115   TRIG 216 (HH) 09/17/2006 1400   HDL 28.60 (L) 09/02/2014 1115   CHOLHDL 7 09/02/2014 1115   VLDL 71.0 (H) 09/02/2014 1115   LDLDIRECT 100.0 09/02/2014 1115     Other studies Reviewed: Additional studies/ records that were reviewed today with results demonstrating: none.   ASSESSMENT AND PLAN:  1. Angina:  Sx concerning for angina.  Start aspirin 81 mg daily.  Will call in SL NTG. COntinue beta blocker.  Given family history and associated syncope, will plan for cath.  Strong family h/o CAD.   Risks and benefits of cath were explained to the patient and he would like to proceed.  All questions answered.  Plan for radial approach.   2. Syncope: CONtinue to stay well hydrated.  Plan for echo to check for structural heart disease that would predispose him to syncope.  3. Obesity:  He would benefit from weight loss long term. 4. TG improved but still elevated.     Current medicines are reviewed at length with the patient today.  The patient concerns regarding his medicines were addressed.  The following changes have been made:  No change  Labs/ tests ordered today include:  Orders Placed This Encounter  Procedures  . EKG 12-Lead    Recommend 150 minutes/week of aerobic exercise Low fat, low carb, high fiber diet recommended  Disposition:   FU post cath   Signed, Larae Grooms, MD  09/28/2016 11:20 AM    [facility] [place], [place], [place]  [address] Phone: [phone]; Fax: [phone]

## 2016-10-04 ENCOUNTER — Other Ambulatory Visit: Payer: Self-pay

## 2016-10-04 NOTE — Telephone Encounter (Signed)
Pt left v/m requesting rx hydrocodone apap. Call when ready for pick up. rx last printed # 120 on 09/07/16. Last seen 08/25/16. Would like to pick up on 10/06/16.

## 2016-10-05 MED ORDER — HYDROCODONE-ACETAMINOPHEN 7.5-325 MG PO TABS: 1.0000 | ORAL_TABLET | Freq: Four times a day (QID) | ORAL | 0 refills | Status: DC | PRN

## 2016-10-05 NOTE — Telephone Encounter (Signed)
Left message per DPR that rx is up front ready for pickup

## 2016-10-06 ENCOUNTER — Other Ambulatory Visit: Payer: Self-pay

## 2016-10-06 ENCOUNTER — Other Ambulatory Visit: Payer: Self-pay | Admitting: *Deleted

## 2016-10-06 DIAGNOSIS — Z01810 Encounter for preprocedural cardiovascular examination: Secondary | ICD-10-CM

## 2016-10-06 DIAGNOSIS — I209 Angina pectoris, unspecified: Secondary | ICD-10-CM

## 2016-10-06 LAB — BASIC METABOLIC PANEL
BUN: 12 mg/dL (ref 7–25)
CALCIUM: 8.9 mg/dL (ref 8.6–10.3)
CO2: 24 mmol/L (ref 20–31)
CREATININE: 0.71 mg/dL (ref 0.60–1.35)
Chloride: 98 mmol/L (ref 98–110)
GLUCOSE: 231 mg/dL — AB (ref 65–99)
Potassium: 3.8 mmol/L (ref 3.5–5.3)
Sodium: 134 mmol/L — ABNORMAL LOW (ref 135–146)

## 2016-10-06 LAB — CBC WITH DIFFERENTIAL/PLATELET
BASOS ABS: 91 {cells}/uL (ref 0–200)
Basophils Relative: 1 %
EOS PCT: 3 %
Eosinophils Absolute: 273 cells/uL (ref 15–500)
HCT: 44.9 % (ref 38.5–50.0)
Hemoglobin: 15.2 g/dL (ref 13.2–17.1)
Lymphocytes Relative: 36 %
Lymphs Abs: 3276 cells/uL (ref 850–3900)
MCH: 30.5 pg (ref 27.0–33.0)
MCHC: 33.9 g/dL (ref 32.0–36.0)
MCV: 90 fL (ref 80.0–100.0)
MONOS PCT: 7 %
MPV: 10.5 fL (ref 7.5–12.5)
Monocytes Absolute: 637 cells/uL (ref 200–950)
NEUTROS ABS: 4823 {cells}/uL (ref 1500–7800)
NEUTROS PCT: 53 %
PLATELETS: 165 10*3/uL (ref 140–400)
RBC: 4.99 MIL/uL (ref 4.20–5.80)
RDW: 13.7 % (ref 11.0–15.0)
WBC: 9.1 10*3/uL (ref 3.8–10.8)

## 2016-10-06 MED ORDER — NITROGLYCERIN 0.4 MG SL SUBL: 0.4000 mg | SUBLINGUAL_TABLET | SUBLINGUAL | 3 refills | Status: DC | PRN

## 2016-10-07 LAB — PROTIME-INR
INR: 1
PROTHROMBIN TIME: 10.4 s (ref 9.0–11.5)

## 2016-10-07 LAB — APTT: aPTT: 27 s (ref 22–34)

## 2016-10-13 ENCOUNTER — Encounter (HOSPITAL_COMMUNITY)
Admission: RE | Disposition: A | Discharge status: Home / Self Care | Payer: Self-pay | Source: Ambulatory Visit | Attending: Interventional Cardiology

## 2016-10-13 ENCOUNTER — Ambulatory Visit (HOSPITAL_COMMUNITY)
Admission: RE | Admit: 2016-10-13 | Discharge: 2016-10-13 | Disposition: A | Discharge status: Home / Self Care | Payer: Self-pay | Source: Ambulatory Visit | Attending: Interventional Cardiology | Admitting: Interventional Cardiology

## 2016-10-13 DIAGNOSIS — I10 Essential (primary) hypertension: Secondary | ICD-10-CM | POA: Insufficient documentation

## 2016-10-13 DIAGNOSIS — Z6839 Body mass index (BMI) 39.0-39.9, adult: Secondary | ICD-10-CM | POA: Insufficient documentation

## 2016-10-13 DIAGNOSIS — E119 Type 2 diabetes mellitus without complications: Secondary | ICD-10-CM | POA: Insufficient documentation

## 2016-10-13 DIAGNOSIS — Z8249 Family history of ischemic heart disease and other diseases of the circulatory system: Secondary | ICD-10-CM | POA: Insufficient documentation

## 2016-10-13 DIAGNOSIS — I2510 Atherosclerotic heart disease of native coronary artery without angina pectoris: Secondary | ICD-10-CM | POA: Insufficient documentation

## 2016-10-13 DIAGNOSIS — Z79899 Other long term (current) drug therapy: Secondary | ICD-10-CM | POA: Insufficient documentation

## 2016-10-13 DIAGNOSIS — F419 Anxiety disorder, unspecified: Secondary | ICD-10-CM | POA: Insufficient documentation

## 2016-10-13 DIAGNOSIS — E669 Obesity, unspecified: Secondary | ICD-10-CM | POA: Insufficient documentation

## 2016-10-13 DIAGNOSIS — Z7984 Long term (current) use of oral hypoglycemic drugs: Secondary | ICD-10-CM | POA: Insufficient documentation

## 2016-10-13 DIAGNOSIS — R55 Syncope and collapse: Secondary | ICD-10-CM | POA: Insufficient documentation

## 2016-10-13 DIAGNOSIS — R072 Precordial pain: Secondary | ICD-10-CM | POA: Insufficient documentation

## 2016-10-13 HISTORY — PX: CARDIAC CATHETERIZATION: SHX172

## 2016-10-13 LAB — GLUCOSE, CAPILLARY
GLUCOSE-CAPILLARY: 221 mg/dL — AB (ref 65–99)
Glucose-Capillary: 215 mg/dL — ABNORMAL HIGH (ref 65–99)

## 2016-10-13 SURGERY — LEFT HEART CATH AND CORONARY ANGIOGRAPHY

## 2016-10-13 MED ORDER — MIDAZOLAM HCL 2 MG/2ML IJ SOLN
INTRAMUSCULAR | Status: AC
Start: 2016-10-13 — End: 2016-10-13
  Filled 2016-10-13: qty 2

## 2016-10-13 MED ORDER — MIDAZOLAM HCL 2 MG/2ML IJ SOLN
INTRAMUSCULAR | Status: DC | PRN
  Administered 2016-10-13: 2 mg via INTRAVENOUS
  Administered 2016-10-13 (×2): 1 mg via INTRAVENOUS

## 2016-10-13 MED ORDER — HEPARIN SODIUM (PORCINE) 1000 UNIT/ML IJ SOLN
INTRAMUSCULAR | Status: AC
  Filled 2016-10-13: qty 1

## 2016-10-13 MED ORDER — LIDOCAINE HCL (PF) 1 % IJ SOLN
INTRAMUSCULAR | Status: DC | PRN
  Administered 2016-10-13: 2 mL

## 2016-10-13 MED ORDER — METFORMIN HCL 500 MG PO TABS: 500.0000 mg | ORAL_TABLET | Freq: Three times a day (TID) | ORAL | 3 refills | Status: DC

## 2016-10-13 MED ORDER — IOPAMIDOL (ISOVUE-370) INJECTION 76%
INTRAVENOUS | Status: DC | PRN
  Administered 2016-10-13: 90 mL via INTRA_ARTERIAL

## 2016-10-13 MED ORDER — IOPAMIDOL (ISOVUE-370) INJECTION 76%
INTRAVENOUS | Status: AC
  Filled 2016-10-13: qty 100

## 2016-10-13 MED ORDER — HEPARIN (PORCINE) IN NACL 2-0.9 UNIT/ML-% IJ SOLN
INTRAMUSCULAR | Status: DC | PRN
  Administered 2016-10-13: 1000 mL

## 2016-10-13 MED ORDER — ASPIRIN 81 MG PO CHEW
81.0000 mg | CHEWABLE_TABLET | ORAL | Status: AC
  Administered 2016-10-13: 81 mg via ORAL

## 2016-10-13 MED ORDER — SODIUM CHLORIDE 0.9 % IV SOLN: INTRAVENOUS | Status: DC

## 2016-10-13 MED ORDER — MIDAZOLAM HCL 2 MG/2ML IJ SOLN
INTRAMUSCULAR | Status: AC
  Filled 2016-10-13: qty 2

## 2016-10-13 MED ORDER — ASPIRIN 81 MG PO CHEW
CHEWABLE_TABLET | ORAL | Status: AC
  Administered 2016-10-13: 81 mg via ORAL
  Filled 2016-10-13: qty 1

## 2016-10-13 MED ORDER — SODIUM CHLORIDE 0.9 % WEIGHT BASED INFUSION
1.0000 mL/kg/h | INTRAVENOUS | Status: DC
Start: 2016-10-13 — End: 2016-10-13

## 2016-10-13 MED ORDER — FENTANYL CITRATE (PF) 100 MCG/2ML IJ SOLN
INTRAMUSCULAR | Status: DC | PRN
  Administered 2016-10-13 (×2): 25 ug via INTRAVENOUS
  Administered 2016-10-13: 50 ug via INTRAVENOUS

## 2016-10-13 MED ORDER — SODIUM CHLORIDE 0.9 % IV SOLN: 250.0000 mL | INTRAVENOUS | Status: DC | PRN

## 2016-10-13 MED ORDER — HEPARIN (PORCINE) IN NACL 2-0.9 UNIT/ML-% IJ SOLN
INTRAMUSCULAR | Status: AC
Start: 2016-10-13 — End: 2016-10-13
  Filled 2016-10-13: qty 1000

## 2016-10-13 MED ORDER — VERAPAMIL HCL 2.5 MG/ML IV SOLN
INTRAVENOUS | Status: AC
  Filled 2016-10-13: qty 2

## 2016-10-13 MED ORDER — FENTANYL CITRATE (PF) 100 MCG/2ML IJ SOLN
INTRAMUSCULAR | Status: AC
  Filled 2016-10-13: qty 2

## 2016-10-13 MED ORDER — HEPARIN (PORCINE) IN NACL 2-0.9 UNIT/ML-% IJ SOLN
INTRAMUSCULAR | Status: DC | PRN
  Administered 2016-10-13: 10 mL via INTRA_ARTERIAL

## 2016-10-13 MED ORDER — SODIUM CHLORIDE 0.9% FLUSH: 3.0000 mL | INTRAVENOUS | Status: DC | PRN

## 2016-10-13 MED ORDER — HEPARIN SODIUM (PORCINE) 1000 UNIT/ML IJ SOLN
INTRAMUSCULAR | Status: DC | PRN
  Administered 2016-10-13: 6000 [IU] via INTRAVENOUS

## 2016-10-13 MED ORDER — SODIUM CHLORIDE 0.9% FLUSH: 3.0000 mL | Freq: Two times a day (BID) | INTRAVENOUS | Status: DC

## 2016-10-13 MED ORDER — LIDOCAINE HCL (PF) 1 % IJ SOLN
INTRAMUSCULAR | Status: AC
  Filled 2016-10-13: qty 30

## 2016-10-13 MED ORDER — SODIUM CHLORIDE 0.9 % WEIGHT BASED INFUSION
3.0000 mL/kg/h | INTRAVENOUS | Status: AC
  Administered 2016-10-13: 3 mL/kg/h via INTRAVENOUS

## 2016-10-13 SURGICAL SUPPLY — 12 items
CATH 5FR JL3.5 JR4 ANG PIG MP (CATHETERS) ×1 IMPLANT
CATH LAUNCHER 5F EBU3.0 (CATHETERS) IMPLANT
CATHETER LAUNCHER 5F EBU3.0 (CATHETERS) ×2
DEVICE RAD COMP TR BAND LRG (VASCULAR PRODUCTS) ×1 IMPLANT
GLIDESHEATH SLEND SS 6F .021 (SHEATH) ×1 IMPLANT
GUIDEWIRE INQWIRE 1.5J.035X260 (WIRE) IMPLANT
INQWIRE 1.5J .035X260CM (WIRE) ×2
KIT HEART LEFT (KITS) ×2 IMPLANT
PACK CARDIAC CATHETERIZATION (CUSTOM PROCEDURE TRAY) ×2 IMPLANT
SYR MEDRAD MARK V 150ML (SYRINGE) ×2 IMPLANT
TRANSDUCER W/STOPCOCK (MISCELLANEOUS) ×2 IMPLANT
TUBING CIL FLEX 10 FLL-RA (TUBING) ×2 IMPLANT

## 2016-10-13 NOTE — Research (Signed)
CADLAD Informed Consent   Subject Name: John Rhodes  Subject met inclusion and exclusion criteria.  The informed consent form, study requirements and expectations were reviewed with the subject and questions and concerns were addressed prior to the signing of the consent form.  The subject verbalized understanding of the trail requirements.  The subject agreed to participate in the CADLAD trial and signed the informed consent.  The informed consent was obtained prior to performance of any protocol-specific procedures for the subject.  A copy of the signed informed consent was given to the subject and a copy was placed in the subject's medical record.  Jimmy Picket 10/13/2016, [phone]

## 2016-10-13 NOTE — Interval H&P Note (Signed)
Cath Lab Visit (complete for each Cath Lab visit)  Clinical Evaluation Leading to the Procedure:   ACS: No.  Non-ACS:    Anginal Classification: CCS III  Anti-ischemic medical therapy: Minimal Therapy (1 class of medications)  Non-Invasive Test Results: No non-invasive testing performed  Prior CABG: No previous CABG      History and Physical Interval Note:  10/13/2016 11:56 AM  John Rhodes  has presented today for surgery, with the diagnosis of cp- syncope  The various methods of treatment have been discussed with the patient and family. After consideration of risks, benefits and other options for treatment, the patient has consented to  Procedure(s): Left Heart Cath and Coronary Angiography (N/A) as a surgical intervention .  The patient's history has been reviewed, patient examined, no change in status, stable for surgery.  I have reviewed the patient's chart and labs.  Questions were answered to the patient's satisfaction.     Larae Grooms

## 2016-10-13 NOTE — Progress Notes (Signed)
Pt ambulated to bathroom unassisted, voided without difficulty. Tolerated ambulation well.

## 2016-10-13 NOTE — H&P (View-Only) (Signed)
Cardiology Office Note   Date:  09/28/2016   ID:  John Rhodes, DOB 30-Nov-1975, MRN [id number]  PCP:  John Simpler, MD    Chief Complaint  Patient presents with  . Establish Care  . Chest Pain     Wt Readings from Last 3 Encounters:  09/28/16 123.4 kg (272 lb)  08/25/16 121.1 kg (267 lb)  02/24/16 115.2 kg (254 lb)       History of Present Illness: John Rhodes is a 40 y.o. male  Who has had issues the exertion.  He has noticed that he breaks into a cold sweat, then has pressure in his chest and thenlightheaded.  If he does not sit, he will pass out.  He has passed out four times.  This has been going on intermittently for 6 months.  He called EMS once.  THey advised that he go to the hospital but he declined.    Both parents have had MIs.  Father died at age 52 from heart disease.  Mother had a stroke.  Father had 3 MIs before he turned 74.    No siblings.  His prior PMD had recommended a heart cath and the patient would like to have this done.    The patient has reduced activity over the past few months to help avoid the chest discomfort amd syncope. Last syncopal spell was in June.    Past Medical History:  Diagnosis Date  . Anxiety   . Chronic back pain   . Diabetes mellitus without complication ([place])   . History of thumb surgery   . Hypertension     Past Surgical History:  Procedure Laterality Date  . arm surgery       Current Outpatient Prescriptions  Medication Sig Dispense Refill  . ALPRAZolam (XANAX) 0.5 MG tablet TAKE ONE TABLET BY MOUTH TWICE DAILY 60 tablet 0  . bisoprolol-hydrochlorothiazide (ZIAC) 5-6.25 MG tablet Take 1 tablet by mouth daily. 90 tablet 3  . gabapentin (NEURONTIN) 300 MG capsule Take 1-2 capsules (300-600 mg total) by mouth 3 (three) times daily. 60 capsule 11  . glipiZIDE (GLUCOTROL) 5 MG tablet Take 1 tablet (5 mg total) by mouth 2 (two) times daily before a meal. 60 tablet 5  . HYDROcodone-acetaminophen (NORCO)  7.5-325 MG tablet Take 1 tablet by mouth every 6 (six) hours as needed. 120 tablet 0  . lisinopril-hydrochlorothiazide (PRINZIDE,ZESTORETIC) 20-12.5 MG tablet TAKE TWO TABLETS BY MOUTH ONCE DAILY 180 tablet 3  . metFORMIN (GLUCOPHAGE) 500 MG tablet TAKE ONE TABLET BY MOUTH THREE TIMES DAILY 90 tablet 3  . sertraline (ZOLOFT) 100 MG tablet Take 1.5 tablets (150 mg total) by mouth daily. 45 tablet 12   No current facility-administered medications for this visit.     Allergies:   Penicillins    Social History:  The patient  reports that he has never smoked. His smokeless tobacco use includes Snuff. He reports that he drinks alcohol. He reports that he does not use drugs.   Family History:  The patient's family history includes Heart attack in his father and mother; Stroke in his mother.    ROS:  Please see the history of present illness.   Otherwise, review of systems are positive for chest pain and syncope, although sx have been less over the past few months since he has reduced activity.   All other systems are reviewed and negative.    PHYSICAL EXAM: VS:  BP (!) 160/110 (BP Location: Right Arm, Patient  Position: Sitting, Cuff Size: Large)   Pulse (!) 108   Ht 5' 6"  (1.676 m)   Wt 123.4 kg (272 lb)   SpO2 98%   BMI 43.90 kg/m  , BMI Body mass index is 43.9 kg/m. GEN: Well nourished, well developed, in no acute distress  HEENT: normal  Neck: no JVD, carotid bruits, or masses Cardiac: RRR; no murmurs, rubs, or gallops,no edema ; bilateral radial pulses are equal; 2+ despite BP difference Respiratory:  clear to auscultation bilaterally, normal work of breathing GI: soft, nontender, nondistended, + BS MS: no deformity or atrophy  Skin: warm and dry, no rash Neuro:  Strength and sensation are intact Psych: euthymic mood, full affect   EKG:   The ekg ordered today demonstrates NSR, inferior Q waves, nonspecific ST segment flattening   Recent Labs: 08/25/2016: ALT 36; BUN 14;  Creatinine, Ser 0.83; Hemoglobin 16.3; Platelets 223.0; Potassium 3.8; Sodium 137   Lipid Panel    Component Value Date/Time   CHOL 193 09/02/2014 1115   TRIG 355.0 (H) 09/02/2014 1115   TRIG 216 (HH) 09/17/2006 1400   HDL 28.60 (L) 09/02/2014 1115   CHOLHDL 7 09/02/2014 1115   VLDL 71.0 (H) 09/02/2014 1115   LDLDIRECT 100.0 09/02/2014 1115     Other studies Reviewed: Additional studies/ records that were reviewed today with results demonstrating: none.   ASSESSMENT AND PLAN:  1. Angina:  Sx concerning for angina.  Start aspirin 81 mg daily.  Will call in SL NTG. COntinue beta blocker.  Given family history and associated syncope, will plan for cath.  Strong family h/o CAD.   Risks and benefits of cath were explained to the patient and he would like to proceed.  All questions answered.  Plan for radial approach.   2. Syncope: CONtinue to stay well hydrated.  Plan for echo to check for structural heart disease that would predispose him to syncope.  3. Obesity:  He would benefit from weight loss long term. 4. TG improved but still elevated.     Current medicines are reviewed at length with the patient today.  The patient concerns regarding his medicines were addressed.  The following changes have been made:  No change  Labs/ tests ordered today include:  Orders Placed This Encounter  Procedures  . EKG 12-Lead    Recommend 150 minutes/week of aerobic exercise Low fat, low carb, high fiber diet recommended  Disposition:   FU post cath   Signed, John Grooms, MD  09/28/2016 11:20 AM    [facility] [place], [place], [place]  [address] Phone: [phone]; Fax: [phone]

## 2016-10-13 NOTE — Discharge Instructions (Signed)

## 2016-10-16 ENCOUNTER — Encounter (HOSPITAL_COMMUNITY): Payer: Self-pay | Admitting: Interventional Cardiology

## 2016-10-19 ENCOUNTER — Other Ambulatory Visit: Payer: Self-pay | Admitting: Internal Medicine

## 2016-10-20 ENCOUNTER — Other Ambulatory Visit (HOSPITAL_COMMUNITY): Payer: Self-pay

## 2016-10-23 ENCOUNTER — Other Ambulatory Visit: Payer: Self-pay | Admitting: Internal Medicine

## 2016-10-23 NOTE — Telephone Encounter (Signed)
Last filled 09-25-16 #60 Last OV 08-25-16 Next OV 02-23-17

## 2016-10-23 NOTE — Telephone Encounter (Signed)
Left refill on voice mail at pharmacy

## 2016-10-23 NOTE — Telephone Encounter (Signed)
Approved: #60 x 0

## 2016-10-23 NOTE — Telephone Encounter (Signed)
Last office visit 08/25/2016.  Last refilled 09/22/2016 for #60 with no refills.

## 2016-11-02 ENCOUNTER — Other Ambulatory Visit: Payer: Self-pay

## 2016-11-02 NOTE — Telephone Encounter (Signed)
Pt left v/m requesting rx hydrocodone apap. Call when ready for pick up. Last printed # 120 on 10/05/16. Pt last seen 08/25/16. Dr Silvio Pate out of office.

## 2016-11-03 MED ORDER — HYDROCODONE-ACETAMINOPHEN 7.5-325 MG PO TABS: 1.0000 | ORAL_TABLET | Freq: Four times a day (QID) | ORAL | 0 refills | Status: DC | PRN

## 2016-11-03 NOTE — Telephone Encounter (Signed)
Printed.  Thanks.  

## 2016-11-03 NOTE — Telephone Encounter (Signed)
Spoke to pt. Rx up front ready for pickup 

## 2016-12-01 ENCOUNTER — Other Ambulatory Visit: Payer: Self-pay | Admitting: *Deleted

## 2016-12-01 NOTE — Telephone Encounter (Signed)
Pt called requesting refill. Last written on 11/03/16. Pt last seen on 08/25/16. F/u scheduled on 02/23/17

## 2016-12-02 NOTE — Telephone Encounter (Signed)
Approved: okay to prepare #120 x 0

## 2016-12-04 MED ORDER — HYDROCODONE-ACETAMINOPHEN 7.5-325 MG PO TABS: 1.0000 | ORAL_TABLET | Freq: Four times a day (QID) | ORAL | 0 refills | Status: DC | PRN

## 2016-12-04 NOTE — Telephone Encounter (Signed)
Rx printed and waiting for signature

## 2016-12-04 NOTE — Telephone Encounter (Signed)
Rx signed and up front. Spoke to pt

## 2016-12-23 ENCOUNTER — Other Ambulatory Visit: Payer: Self-pay | Admitting: Internal Medicine

## 2016-12-25 ENCOUNTER — Other Ambulatory Visit: Payer: Self-pay | Admitting: Internal Medicine

## 2016-12-25 NOTE — Telephone Encounter (Signed)
Left refill on voice mail at pharmacy

## 2016-12-25 NOTE — Telephone Encounter (Signed)
Last filled 11-24-16 #60 Last OV 08-25-16 Next OV 02-23-17

## 2016-12-25 NOTE — Telephone Encounter (Signed)
Approved: #60 x 0

## 2017-01-01 ENCOUNTER — Other Ambulatory Visit: Payer: Self-pay

## 2017-01-01 NOTE — Telephone Encounter (Signed)
Pt left v/m requesting rx hydrocodone apap. Call when ready for pick up. Last printed rx # 120 on 12/04/16. Last seen 08/25/16.

## 2017-01-02 MED ORDER — HYDROCODONE-ACETAMINOPHEN 7.5-325 MG PO TABS: 1.0000 | ORAL_TABLET | Freq: Four times a day (QID) | ORAL | 0 refills | Status: DC | PRN

## 2017-01-02 NOTE — Telephone Encounter (Signed)
Pt called about the status of his rx  Please call when ready for pick up

## 2017-01-02 NOTE — Telephone Encounter (Signed)
Spoke to pt. rx up front ready for pickup

## 2017-01-24 ENCOUNTER — Other Ambulatory Visit: Payer: Self-pay | Admitting: Internal Medicine

## 2017-01-24 NOTE — Telephone Encounter (Signed)
Ok to phone in Xanax

## 2017-01-24 NOTE — Telephone Encounter (Signed)
Last Rx 12/25/16. Last f/u 08/2016

## 2017-01-24 NOTE — Telephone Encounter (Signed)
Rx called in to requested pharmacy 

## 2017-01-30 ENCOUNTER — Other Ambulatory Visit: Payer: Self-pay | Admitting: *Deleted

## 2017-01-30 NOTE — Telephone Encounter (Signed)
Patient left a voicemail requesting refill on pain medication Hydrocodone. Call when ready for pickup Last refill 01/02/17 #120 Last office visit 08/25/16

## 2017-01-31 ENCOUNTER — Other Ambulatory Visit (INDEPENDENT_AMBULATORY_CARE_PROVIDER_SITE_OTHER): Payer: Self-pay

## 2017-01-31 ENCOUNTER — Encounter: Payer: Self-pay | Admitting: Internal Medicine

## 2017-01-31 DIAGNOSIS — Z0283 Encounter for blood-alcohol and blood-drug test: Secondary | ICD-10-CM

## 2017-01-31 MED ORDER — HYDROCODONE-ACETAMINOPHEN 7.5-325 MG PO TABS: 1.0000 | ORAL_TABLET | Freq: Four times a day (QID) | ORAL | 0 refills | Status: DC | PRN

## 2017-01-31 NOTE — Telephone Encounter (Signed)
Spoke to pt and informed him Rx is available for pickup from the front desk. Pt advised third party unable to pickup .

## 2017-02-05 LAB — TOXASSURE SELECT 13 (MW), URINE

## 2017-02-12 ENCOUNTER — Telehealth: Payer: Self-pay | Admitting: Internal Medicine

## 2017-02-12 NOTE — Telephone Encounter (Signed)
Fine, can schedule for wellness/transfer

## 2017-02-12 NOTE — Telephone Encounter (Signed)
Okay with me 

## 2017-02-12 NOTE — Telephone Encounter (Signed)
Pt came in with [facility] and states he was given verbal ok from [place] that he can be accepted. Pt requesting to transfer from [place] to [place]. Please advise.

## 2017-02-13 NOTE — Telephone Encounter (Signed)
Called pt back and made appointment for CPE 4/5.

## 2017-02-22 ENCOUNTER — Ambulatory Visit (INDEPENDENT_AMBULATORY_CARE_PROVIDER_SITE_OTHER): Payer: Self-pay | Admitting: Internal Medicine

## 2017-02-22 ENCOUNTER — Encounter: Payer: Self-pay | Admitting: Internal Medicine

## 2017-02-22 DIAGNOSIS — G8929 Other chronic pain: Secondary | ICD-10-CM

## 2017-02-22 DIAGNOSIS — E1149 Type 2 diabetes mellitus with other diabetic neurological complication: Secondary | ICD-10-CM

## 2017-02-22 DIAGNOSIS — M549 Dorsalgia, unspecified: Secondary | ICD-10-CM

## 2017-02-22 MED ORDER — ALPRAZOLAM 0.5 MG PO TABS: 0.5000 mg | ORAL_TABLET | Freq: Two times a day (BID) | ORAL | 3 refills | Status: DC | PRN

## 2017-02-22 NOTE — Progress Notes (Signed)
Pre visit review using our clinic review tool, if applicable. No additional management support is needed unless otherwise documented below in the visit note. 

## 2017-02-22 NOTE — Progress Notes (Signed)
   Subjective:    Patient ID: John Rhodes, male    DOB: 1976/06/29, 41 y.o.   MRN: [id number]  HPI The patient is a 41 YO man coming in for transfer care from another provider for location. He does have several chronic conditions which we discuss briefly. He is not having any changes in his health. Sugars are markedly improved with the glipizide. His pain is well controlled on current regimen and recent UDS within limits.   PMH, [facility], social history reviewed and updated.   Review of Systems  Constitutional: Negative for activity change, appetite change, fatigue, fever and unexpected weight change.  Respiratory: Negative.   Cardiovascular: Negative.   Gastrointestinal: Negative.   Musculoskeletal: Positive for arthralgias and back pain. Negative for gait problem, joint swelling and myalgias.  Skin: Negative.   Neurological: Negative.       Objective:   Physical Exam  Constitutional: He is oriented to person, place, and time. He appears well-developed and well-nourished.  HENT:  Head: Normocephalic and atraumatic.  Eyes: EOM are normal.  Cardiovascular: Normal rate and regular rhythm.   Pulmonary/Chest: Effort normal and breath sounds normal.  Abdominal: Soft. He exhibits no distension. There is no tenderness. There is no rebound.  Musculoskeletal: He exhibits no edema.  Neurological: He is alert and oriented to person, place, and time. Coordination normal.  Skin: Skin is warm and dry.   Vitals:   02/22/17 0800  BP: (!) 150/90  Pulse: 96  Resp: 12  Temp: 98.2 F (36.8 C)  TempSrc: Oral  SpO2: 98%  Weight: 267 lb (121.1 kg)  Height: 5' 7"  (1.702 m)      Assessment & Plan:

## 2017-02-22 NOTE — Patient Instructions (Signed)
We will get all the medicines refills.

## 2017-02-23 ENCOUNTER — Ambulatory Visit: Payer: Self-pay | Admitting: Internal Medicine

## 2017-02-23 NOTE — Assessment & Plan Note (Signed)
Sugars are improved with glipizide and metformin. He asks to wait on HgA1c due to self pay costs. Okay with that and he will continue monitoring sugars at home fasting.

## 2017-02-23 NOTE — Assessment & Plan Note (Signed)
Taking vicodin #120 per month for at least 4 years now. Will agree to maintain but if worsening he needs to pursue neurosurgery for alternatives.

## 2017-02-27 ENCOUNTER — Telehealth: Payer: Self-pay | Admitting: *Deleted

## 2017-02-27 MED ORDER — HYDROCODONE-ACETAMINOPHEN 7.5-325 MG PO TABS: 1.0000 | ORAL_TABLET | Freq: Four times a day (QID) | ORAL | 0 refills | Status: DC | PRN

## 2017-02-27 NOTE — Telephone Encounter (Signed)
Printed and signed.  

## 2017-02-27 NOTE — Telephone Encounter (Signed)
Notified pt rx ready for pick-up.../lmb 

## 2017-02-27 NOTE — Telephone Encounter (Signed)
Rec'd call he requesting refill on his Hydrocodone...John Rhodes

## 2017-03-27 ENCOUNTER — Telehealth: Payer: Self-pay | Admitting: Internal Medicine

## 2017-03-27 NOTE — Telephone Encounter (Signed)
Requesting refill on hydrocodone.

## 2017-03-28 MED ORDER — HYDROCODONE-ACETAMINOPHEN 7.5-325 MG PO TABS: 1.0000 | ORAL_TABLET | Freq: Four times a day (QID) | ORAL | 0 refills | Status: DC | PRN

## 2017-03-28 NOTE — Telephone Encounter (Signed)
Printed and signed.  

## 2017-03-28 NOTE — Telephone Encounter (Signed)
Patient contacted and aware its ready for pickup

## 2017-04-09 ENCOUNTER — Telehealth: Payer: Self-pay | Admitting: *Deleted

## 2017-04-09 MED ORDER — GABAPENTIN 300 MG PO CAPS: 300.0000 mg | ORAL_CAPSULE | Freq: Three times a day (TID) | ORAL | 3 refills | Status: DC

## 2017-04-09 NOTE — Telephone Encounter (Signed)
Fine to do

## 2017-04-09 NOTE — Telephone Encounter (Signed)
Pt left msg on  Triage stating he is now seeing Dr. Sharlet Salina, and needing updated script sent to [facility] on his Gabapentin. Is this ok...John Rhodes

## 2017-04-09 NOTE — Telephone Encounter (Signed)
Notified pt rx has been sent...John Rhodes

## 2017-04-26 ENCOUNTER — Telehealth: Payer: Self-pay | Admitting: *Deleted

## 2017-04-26 NOTE — Telephone Encounter (Signed)
Rec'd call pt requesting refill on his Hydrocodone...John Rhodes

## 2017-04-27 MED ORDER — HYDROCODONE-ACETAMINOPHEN 7.5-325 MG PO TABS: 1.0000 | ORAL_TABLET | Freq: Four times a day (QID) | ORAL | 0 refills | Status: DC | PRN

## 2017-04-27 NOTE — Telephone Encounter (Signed)
Pt would like to know since Jenny Reichmann is out today if this can be sent to another doctor to get approved, he does not want to go all weekend without his meds.

## 2017-04-27 NOTE — Telephone Encounter (Signed)
[place] controlled substance database checked.  Ok to fill medication.

## 2017-04-27 NOTE — Telephone Encounter (Signed)
Notified pt rx ready for pick-up.../lmb 

## 2017-04-27 NOTE — Telephone Encounter (Signed)
MD is out of office pls advise on msg below...Chryl Heck

## 2017-05-24 ENCOUNTER — Telehealth: Payer: Self-pay | Admitting: *Deleted

## 2017-05-24 MED ORDER — HYDROCODONE-ACETAMINOPHEN 7.5-325 MG PO TABS: 1.0000 | ORAL_TABLET | Freq: Four times a day (QID) | ORAL | 0 refills | Status: DC | PRN

## 2017-05-24 NOTE — Telephone Encounter (Signed)
 Controlled Substance Database reviewed with no irregularities. Medication dated per previous refill and available for pick up.

## 2017-05-24 NOTE — Telephone Encounter (Signed)
Called pt inform rx ready for p[ick-up...John Rhodes

## 2017-05-24 NOTE — Telephone Encounter (Signed)
Left msg on triage needing refill his Hydrocodone. Per [place] registry last filled @ [facility] on 04/27/2017. MD out of office pls advise...Johny Chess

## 2017-06-17 ENCOUNTER — Other Ambulatory Visit: Payer: Self-pay | Admitting: Internal Medicine

## 2017-06-18 NOTE — Telephone Encounter (Signed)
Last dispensed 05/19/2017 with 60 tabs for 30 days

## 2017-06-20 ENCOUNTER — Telehealth: Payer: Self-pay | Admitting: Internal Medicine

## 2017-06-20 MED ORDER — HYDROCODONE-ACETAMINOPHEN 7.5-325 MG PO TABS
1.0000 | ORAL_TABLET | Freq: Four times a day (QID) | ORAL | 0 refills | Status: DC | PRN
Start: 2017-06-20 — End: 2017-06-22

## 2017-06-20 NOTE — Telephone Encounter (Signed)
Medication printed for pick up. Needs UDS please.

## 2017-06-20 NOTE — Telephone Encounter (Signed)
Check [place] registry last filled [facility]/25/[facility] @ [facility] pls advise.....John Rhodes

## 2017-06-20 NOTE — Telephone Encounter (Signed)
Pt called requesting a refill on HYDROcodone-acetaminophen (NORCO) 7.5-325 MG tablet. Please advise.

## 2017-06-21 ENCOUNTER — Encounter: Payer: Self-pay | Admitting: Family

## 2017-06-21 ENCOUNTER — Telehealth: Payer: Self-pay | Admitting: Internal Medicine

## 2017-06-21 NOTE — Telephone Encounter (Signed)
Notified pt rx ready for pick-up.../lmb 

## 2017-06-21 NOTE — Telephone Encounter (Signed)
States that hydrocodone script was not written correctly.  Please follow up in regard.

## 2017-06-22 ENCOUNTER — Other Ambulatory Visit: Payer: Self-pay | Admitting: Family

## 2017-06-22 MED ORDER — HYDROCODONE-ACETAMINOPHEN 7.5-325 MG PO TABS: 1.0000 | ORAL_TABLET | Freq: Four times a day (QID) | ORAL | 0 refills | Status: DC | PRN

## 2017-06-22 NOTE — Telephone Encounter (Signed)
Called pt home # line keep being busy. Called friend # that he called from, and he stated he was not there. Inform him to give [place] a call a bck.Marland KitchenJohny Chess

## 2017-06-22 NOTE — Telephone Encounter (Signed)
Per [place] registry rx was written on 7/5/, and [facility]. Pt should not be short that should be for 30 days. Pls advise Greg on msg...Johny Chess

## 2017-06-22 NOTE — Telephone Encounter (Signed)
Pt called and states his Hydrocodone said not to fill until 8/7, he states he received it last month on 7/5 and states so he should be able to refill it the 4th of this month, if not he will be a couple days without it.  He does not have a call back number, states he was calling from a tablet, he states he will call back at 12.

## 2017-06-22 NOTE — Telephone Encounter (Signed)
Pt call back he took rx to [facility], but they told him they need new rx with correct day. Inform pt we can switch rx out but he will have to bring back old script. Pt states he will be back around 3. Reprinted hydrocodone script dated to be fill on 07/04/17...Johny Chess

## 2017-06-22 NOTE — Telephone Encounter (Signed)
Pt called back. He was told by the pharmacist that he is going to have to have a new script to take to the pharmacy.John Rhodes He is going to come to the office around 3:00. He is to bring his old prescription to trade out for the other one.

## 2017-06-22 NOTE — Telephone Encounter (Signed)
Prescription re-printed and available for pick up.

## 2017-06-22 NOTE — Telephone Encounter (Signed)
Pt brought old script back and we switch scripts. Shredded 1st script.[name]

## 2017-06-22 NOTE — Telephone Encounter (Signed)
Chart and NCCSD reviewed. Ok to fill on or after 06/24/17 based on previous refill.

## 2017-07-01 ENCOUNTER — Other Ambulatory Visit: Payer: Self-pay | Admitting: Internal Medicine

## 2017-07-02 ENCOUNTER — Other Ambulatory Visit: Payer: Self-pay | Admitting: Internal Medicine

## 2017-07-03 NOTE — Telephone Encounter (Signed)
Check  registry last filled 06/18/2017 30 day supply. Denied refill pt not due for refill until 07/19/17. No early refill...Johny Chess

## 2017-07-13 ENCOUNTER — Other Ambulatory Visit: Payer: Self-pay | Admitting: Internal Medicine

## 2017-07-20 ENCOUNTER — Other Ambulatory Visit: Payer: Self-pay | Admitting: Internal Medicine

## 2017-07-24 ENCOUNTER — Telehealth: Payer: Self-pay | Admitting: Internal Medicine

## 2017-07-24 MED ORDER — HYDROCODONE-ACETAMINOPHEN 7.5-325 MG PO TABS: 1.0000 | ORAL_TABLET | Freq: Four times a day (QID) | ORAL | 0 refills | Status: DC | PRN

## 2017-07-24 NOTE — Telephone Encounter (Signed)
NCCSD reviewed with no irregularities and UDS completed 1 month ago normal. Refill medication. Will need follow up office visit as he has not been seen since April 2018.

## 2017-07-24 NOTE — Telephone Encounter (Signed)
Patient called back in stating that he would like script to be written for the 9/4 since there was 31 days in the month last month so he can get filled today.

## 2017-07-24 NOTE — Telephone Encounter (Signed)
Called pt x's 2 line just ring busy place script up front for pick-up...John Rhodes

## 2017-07-24 NOTE — Telephone Encounter (Signed)
Pt need refill on the 5th for his HYDROcodone-acetaminophen ([place]) 7.5-325 MG tablet [[id number]]

## 2017-07-24 NOTE — Telephone Encounter (Signed)
Check [place] registry last filled 06/24/2017...Johny Chess

## 2017-07-30 ENCOUNTER — Other Ambulatory Visit: Payer: Self-pay | Admitting: Internal Medicine

## 2017-07-31 ENCOUNTER — Other Ambulatory Visit: Payer: Self-pay | Admitting: Internal Medicine

## 2017-08-02 ENCOUNTER — Other Ambulatory Visit: Payer: Self-pay | Admitting: Internal Medicine

## 2017-08-13 ENCOUNTER — Other Ambulatory Visit: Payer: Self-pay | Admitting: Internal Medicine

## 2017-08-20 ENCOUNTER — Telehealth: Payer: Self-pay | Admitting: Internal Medicine

## 2017-08-20 MED ORDER — HYDROCODONE-ACETAMINOPHEN 7.5-325 MG PO TABS: 1.0000 | ORAL_TABLET | Freq: Four times a day (QID) | ORAL | 0 refills | Status: DC | PRN

## 2017-08-20 NOTE — Telephone Encounter (Signed)
Check [place] registry last filled 07/24/2017.Marland Kitchen/LMB

## 2017-08-20 NOTE — Telephone Encounter (Signed)
Printed and signed.  

## 2017-08-20 NOTE — Telephone Encounter (Signed)
HYDROcodone-acetaminophen (NORCO) 7.5-325 MG tablet   Patient is requesting a refill on this RX. Please advise. Thank you.

## 2017-08-21 NOTE — Telephone Encounter (Signed)
Tried calling pt x's 2 line keep being busy. If pt call back rx is ready fro pick-up...John Rhodes

## 2017-08-21 NOTE — Telephone Encounter (Signed)
Pt call back checking status on refill [place] inform pt rx was ready for pick-up. Place script upfront...John Rhodes

## 2017-09-19 ENCOUNTER — Telehealth: Payer: Self-pay | Admitting: Internal Medicine

## 2017-09-19 NOTE — Telephone Encounter (Signed)
Check [place] registry last filled 08/22/2017. MD is out of office all week pls advise on refill.Marland Kitchenlmb

## 2017-09-19 NOTE — Telephone Encounter (Signed)
Pt called regarding this.

## 2017-09-19 NOTE — Telephone Encounter (Signed)
HYDROcodone-acetaminophen (NORCO) 7.5-325 MG tablet   Patient is requesting a refill on this medication. He was informed he may need to set up a appointment due to the new policy. Please advise.

## 2017-09-20 MED ORDER — HYDROCODONE-ACETAMINOPHEN 7.5-325 MG PO TABS: 1.0000 | ORAL_TABLET | Freq: Four times a day (QID) | ORAL | 0 refills | Status: DC | PRN

## 2017-09-20 NOTE — Telephone Encounter (Signed)
OK to fill this/these prescription(s) with additional refills x0 Needs to have an OV every 3 months - pls sch next avail appt for pain management w/Dr Sharlet Salina Thank you!

## 2017-09-20 NOTE — Telephone Encounter (Signed)
Printed script, MD left before signing will hold until he return tomorrow for him to sign...John Rhodes

## 2017-09-20 NOTE — Telephone Encounter (Signed)
Pt called and given response,  Med cannot be signed by another MD. Can pick it up tomorrow 11/2 after 9:00am per Lorre Nick

## 2017-09-21 NOTE — Telephone Encounter (Signed)
MD signed script notified pt rx ready for pick-up...John Rhodes

## 2017-09-26 ENCOUNTER — Other Ambulatory Visit: Payer: Self-pay | Admitting: Internal Medicine

## 2017-10-04 ENCOUNTER — Telehealth: Payer: Self-pay | Admitting: Internal Medicine

## 2017-10-04 MED ORDER — GABAPENTIN 300 MG PO CAPS: 300.0000 mg | ORAL_CAPSULE | Freq: Three times a day (TID) | ORAL | 0 refills | Status: DC | PRN

## 2017-10-04 NOTE — Telephone Encounter (Signed)
Pt called for a refill of his  gabapentin (NEURONTIN) 300 MG capsule  Please advise Please send to [place] set up for 11/29

## 2017-10-04 NOTE — Telephone Encounter (Signed)
1st script printed refaxed electronically...John Rhodes

## 2017-10-05 ENCOUNTER — Other Ambulatory Visit: Payer: Self-pay | Admitting: Internal Medicine

## 2017-10-09 ENCOUNTER — Emergency Department (HOSPITAL_COMMUNITY): Payer: Self-pay

## 2017-10-09 ENCOUNTER — Emergency Department (HOSPITAL_COMMUNITY)
Admission: EM | Admit: 2017-10-09 | Discharge: 2017-10-09 | Disposition: A | Discharge status: Home / Self Care | Payer: Self-pay | Attending: Emergency Medicine | Admitting: Emergency Medicine

## 2017-10-09 ENCOUNTER — Encounter (HOSPITAL_COMMUNITY): Payer: Self-pay | Admitting: Emergency Medicine

## 2017-10-09 DIAGNOSIS — Z7984 Long term (current) use of oral hypoglycemic drugs: Secondary | ICD-10-CM | POA: Insufficient documentation

## 2017-10-09 DIAGNOSIS — I10 Essential (primary) hypertension: Secondary | ICD-10-CM | POA: Insufficient documentation

## 2017-10-09 DIAGNOSIS — Y939 Activity, unspecified: Secondary | ICD-10-CM | POA: Insufficient documentation

## 2017-10-09 DIAGNOSIS — Z7982 Long term (current) use of aspirin: Secondary | ICD-10-CM | POA: Insufficient documentation

## 2017-10-09 DIAGNOSIS — M79601 Pain in right arm: Secondary | ICD-10-CM | POA: Insufficient documentation

## 2017-10-09 DIAGNOSIS — Y998 Other external cause status: Secondary | ICD-10-CM | POA: Insufficient documentation

## 2017-10-09 DIAGNOSIS — E119 Type 2 diabetes mellitus without complications: Secondary | ICD-10-CM | POA: Insufficient documentation

## 2017-10-09 DIAGNOSIS — Z79899 Other long term (current) drug therapy: Secondary | ICD-10-CM | POA: Insufficient documentation

## 2017-10-09 DIAGNOSIS — Y929 Unspecified place or not applicable: Secondary | ICD-10-CM | POA: Insufficient documentation

## 2017-10-09 DIAGNOSIS — W11XXXA Fall on and from ladder, initial encounter: Secondary | ICD-10-CM | POA: Insufficient documentation

## 2017-10-09 DIAGNOSIS — F17290 Nicotine dependence, other tobacco product, uncomplicated: Secondary | ICD-10-CM | POA: Insufficient documentation

## 2017-10-09 MED ORDER — OXYCODONE-ACETAMINOPHEN 5-325 MG PO TABS
1.0000 | ORAL_TABLET | Freq: Once | ORAL | Status: AC
  Administered 2017-10-09: 1 via ORAL
  Filled 2017-10-09: qty 1

## 2017-10-09 MED ORDER — IBUPROFEN 600 MG PO TABS: 600.0000 mg | ORAL_TABLET | Freq: Four times a day (QID) | ORAL | 0 refills | Status: AC | PRN

## 2017-10-09 NOTE — ED Triage Notes (Signed)
Pt to ER for evaluation of right arm pain after falling 4 ft from a ladder. CMS intact.

## 2017-10-09 NOTE — ED Notes (Signed)
States took pain medication, which normally takes for his back, without relief.

## 2017-10-09 NOTE — Discharge Instructions (Signed)
Please read and follow all provided instructions.  Your diagnoses today include:  1. Right arm pain     Tests performed today include: Vital signs. See below for your results today.   Medications prescribed:  Take as prescribed   Home care instructions:  Follow any educational materials contained in this packet.  Follow-up instructions: Please follow-up with your primary care provider for further evaluation of symptoms and treatment   Return instructions:  Please return to the Emergency Department if you do not get better, if you get worse, or new symptoms OR  - Fever (temperature greater than 101.41F)  - Bleeding that does not stop with holding pressure to the area    -Severe pain (please note that you may be more sore the day after your accident)  - Chest Pain  - Difficulty breathing  - Severe nausea or vomiting  - Inability to tolerate food and liquids  - Passing out  - Skin becoming red around your wounds  - Change in mental status (confusion or lethargy)  - New numbness or weakness    Please return if you have any other emergent concerns.  Additional Information:  Your vital signs today were: BP (!) 167/91    Pulse (!) 101    Temp 98.6 F (37 C) (Oral)    Resp 20    SpO2 96%  If your blood pressure (BP) was elevated above 135/85 this visit, please have this repeated by your doctor within one month. ---------------

## 2017-10-09 NOTE — ED Notes (Signed)
Pt verbalized understanding discharge instructions and denies any further needs or questions at this time. VS stable, ambulatory and steady gait.   

## 2017-10-09 NOTE — ED Provider Notes (Signed)
[facility] EMERGENCY DEPARTMENT Provider Note   CSN: [id number] Arrival date & time: 10/09/17  1557     History   Chief Complaint Chief Complaint  Patient presents with  . Fall  . Arm Pain    HPI John Rhodes is a 41 y.o. male.  HPI 41 y.o. male with a hx of DM, HTN, presents to the Emergency Department today due to right arm pain. This occurred after falling from 56f ladder. Notes hx of injury to area with distal humerus fracture. Rates pain 6/10. Worse with palpation. ROM intact of shoulder. No numbness/tingling. Grip strength intact. Took Vicodin PTA. No other symptoms noted.   Past Medical History:  Diagnosis Date  . Anxiety   . Chronic back pain   . Diabetes mellitus without complication ([place]   . History of thumb surgery   . Hypertension     Patient Active Problem List   Diagnosis Date Noted  . Precordial pain 01/12/2014  . Anxiety 04/23/2012  . Type 2 diabetes mellitus with neurological manifestations, controlled ([place] 08/20/2009  . Other and unspecified hyperlipidemia 04/02/2008  . Obesity, unspecified 04/02/2008  . SMOKELESS TOBACCO ABUSE 04/02/2008  . Benign essential hypertension 04/02/2008  . BENIGN POSITIONAL VERTIGO, HX OF 04/02/2008  . Chronic back pain 12/24/2007    Past Surgical History:  Procedure Laterality Date  . arm surgery    . CARDIAC CATHETERIZATION N/A 10/13/2016   Procedure: Left Heart Cath and Coronary Angiography;  Surgeon: JJettie Booze MD;  [facility];  Service: Cardiovascular;  Laterality: N/A;       Home Medications    Prior to Admission medications   Medication Sig Start Date End Date Taking? Authorizing Provider  ALPRAZolam (Duanne Moron 0.5 MG tablet TAKE 1 TABLET BY MOUTH TWICE DAILY AS NEEDED 10/05/17   CHoyt Koch MD  aspirin EC 81 MG tablet Take 1 tablet (81 mg total) by mouth daily. 09/28/16   VJettie Booze MD  bisoprolol-hydrochlorothiazide ([facility] 5-6.25  MG tablet Take 1 tablet by mouth daily. 02/24/16   LVenia Carbon MD  gabapentin (NEURONTIN) 300 MG capsule Take 1-2 capsules (300-600 mg total) by mouth 3 (three) times daily. Follow-up appt  due in Sept must see provider for future refills 07/03/17   CHoyt Koch MD  gabapentin (NEURONTIN) 300 MG capsule Take 1-2 capsules (300-600 mg total) 3 (three) times daily as needed by mouth. Per office policy sent 30 day to local pharmacy until appt.../Johny Chess11/15/18   CHoyt Koch MD  glipiZIDE (GLUCOTROL) 5 MG tablet TAKE ONE TABLET BY MOUTH TWICE DAILY BEFORE MEAL(S) 08/13/17   CHoyt Koch MD  HYDROcodone-acetaminophen (NORCO) 7.5-325 MG tablet Take 1 tablet by mouth every 6 (six) hours as needed. 09/20/17   Plotnikov, AEvie Lacks MD  lisinopril-hydrochlorothiazide (PRINZIDE,ZESTORETIC) 20-12.5 MG tablet TAKE TWO TABLETS BY MOUTH ONCE DAILY Patient taking differently: TAKE one TABLETS BY MOUTH ONCE DAILY 02/25/16   LVenia Carbon MD  metFORMIN (GLUCOPHAGE) 500 MG tablet Take 1 tablet (500 mg total) by mouth 3 (three) times daily. 10/15/16   VJettie Booze MD  nitroGLYCERIN (NITROSTAT) 0.4 MG SL tablet Place 1 tablet (0.4 mg total) under the tongue every 5 (five) minutes as needed for chest pain. 10/06/16   VJettie Booze MD  sertraline (ZOLOFT) 100 MG tablet Take 1.5 tablets (150 mg total) by mouth daily. Patient taking differently: Take 100 mg by mouth every evening.  04/08/14   LViviana Simpler  I, MD    Family History Family History  Problem Relation Age of Onset  . Heart attack Mother   . Stroke Mother   . Heart attack Father     Social History Social History   Tobacco Use  . Smoking status: Never Smoker  . Smokeless tobacco: Current User    Types: Snuff  Substance Use Topics  . Alcohol use: Yes    Alcohol/week: 0.0 oz  . Drug use: No     Allergies   Penicillins   Review of Systems Review of Systems ROS reviewed and all are negative for  acute change except as noted in the HPI.  Physical Exam Updated Vital Signs BP (!) 167/91   Pulse (!) 101   Temp 98.6 F (37 C) (Oral)   Resp 20   SpO2 96%   Physical Exam  Constitutional: He is oriented to person, place, and time. Vital signs are normal. He appears well-developed and well-nourished.  HENT:  Head: Normocephalic and atraumatic.  Right Ear: Hearing normal.  Left Ear: Hearing normal.  Eyes: Conjunctivae and EOM are normal. Pupils are equal, round, and reactive to light.  Neck: Normal range of motion. Neck supple.  Cardiovascular: Normal rate, regular rhythm, normal heart sounds and intact distal pulses.  Pulmonary/Chest: Effort normal and breath sounds normal.  Musculoskeletal: Normal range of motion.  TTP along distal aspect of right humerus. No palpable or visible deformities. NVI. Distal pulses appreciated.   Neurological: He is alert and oriented to person, place, and time.  Skin: Skin is warm and dry.  Psychiatric: He has a normal mood and affect. His speech is normal and behavior is normal. Thought content normal.  Nursing note and vitals reviewed.  ED Treatments / Results  Labs (all labs ordered are listed, but only abnormal results are displayed) Labs Reviewed - No data to display  EKG  EKG Interpretation None       Radiology Dg Humerus Right  Result Date: 10/09/2017 CLINICAL DATA:  Right arm pain after fall from 4 feet off a ladder today. History of remote fracture 10 years ago. EXAM: RIGHT HUMERUS - 2+ VIEW COMPARISON:  Report from 02/06/2002 FINDINGS: Chronic healed fracture deformity of the distal humeral diaphysis is noted with medial and volar angulation of the distal fracture fragment. No acute fracture nor joint dislocations. Soft tissues are unremarkable. IMPRESSION: No acute fracture nor joint dislocation. Healed fracture deformity of the distal humeral diaphysis. Electronically Signed   By: Ashley Royalty M.D.   On: 10/09/2017 17:31     Procedures Procedures (including critical care time)  Medications Ordered in ED Medications - No data to display   Initial Impression / Assessment and Plan / ED Course  I have reviewed the triage vital signs and the nursing notes.  Pertinent labs & imaging results that were available during my care of the patient were reviewed by me and considered in my medical decision making (see chart for details).  Final Clinical Impressions(s) / ED Diagnoses   {I have reviewed and evaluated the relevant imaging studies.  {I have reviewed the relevant previous healthcare records.  {I obtained HPI from historian.   ED Course:  Assessment: Patient X-Ray negative for obvious fracture or dislocation. Pt advised to follow up with PCP. Patient given sling while in ED, conservative therapy recommended and discussed. Patient will be discharged home & is agreeable with above plan. Returns precautions discussed. Pt appears safe for discharge  Disposition/Plan:  DC Home Additional Verbal discharge  instructions given and discussed with patient.  Pt Instructed to f/u with PCP in the next week for evaluation and treatment of symptoms. Return precautions given Pt acknowledges and agrees with plan  Supervising Physician [place]  Final diagnoses:  Right arm pain    ED Discharge Orders    None       Shary Decamp, PA-C 10/09/17 1758    Fatima Blank, MD 10/10/17 [phone]

## 2017-10-18 ENCOUNTER — Encounter: Payer: Self-pay | Admitting: Internal Medicine

## 2017-10-18 ENCOUNTER — Ambulatory Visit (INDEPENDENT_AMBULATORY_CARE_PROVIDER_SITE_OTHER): Payer: Self-pay | Admitting: Internal Medicine

## 2017-10-18 VITALS — BP 150/100 | HR 119 | Temp 97.9°F | Ht 67.0 in | Wt 262.0 lb

## 2017-10-18 DIAGNOSIS — E1149 Type 2 diabetes mellitus with other diabetic neurological complication: Secondary | ICD-10-CM

## 2017-10-18 DIAGNOSIS — G8929 Other chronic pain: Secondary | ICD-10-CM

## 2017-10-18 DIAGNOSIS — F1120 Opioid dependence, uncomplicated: Secondary | ICD-10-CM

## 2017-10-18 DIAGNOSIS — Z0289 Encounter for other administrative examinations: Secondary | ICD-10-CM

## 2017-10-18 MED ORDER — HYDROCODONE-ACETAMINOPHEN 7.5-325 MG PO TABS: 1.0000 | ORAL_TABLET | ORAL | 0 refills | Status: DC | PRN

## 2017-10-18 NOTE — Patient Instructions (Signed)
Come get the labs done for the sugars when you can. The orders are in.

## 2017-10-19 DIAGNOSIS — Z0289 Encounter for other administrative examinations: Secondary | ICD-10-CM | POA: Insufficient documentation

## 2017-10-19 DIAGNOSIS — G8929 Other chronic pain: Secondary | ICD-10-CM | POA: Insufficient documentation

## 2017-10-19 DIAGNOSIS — F1120 Opioid dependence, uncomplicated: Secondary | ICD-10-CM | POA: Insufficient documentation

## 2017-10-19 NOTE — Assessment & Plan Note (Signed)
Needs blood work and possible medication adjustment.

## 2017-10-19 NOTE — Assessment & Plan Note (Signed)
[place] narcotic database reviewed and no red flags today. Pain contract explained and signed at the visit. Counseled about proper disposal if needed. Reminded about need for visit every 3 months.

## 2017-10-19 NOTE — Assessment & Plan Note (Addendum)
The patient is a 41 y.o. man coming in for chronic pain management. The patient is taking hydrocodone 5 time(s) per day. They take it for back and shoulder pain. This medication allows them to do the following ADLs: work in yard, [employer]. They also see no one else for this. They deny taking too much medication in the last 3 months.

## 2017-10-19 NOTE — Progress Notes (Signed)
   Subjective:    Patient ID: John Rhodes, male    DOB: 1976/06/27, 41 y.o.   MRN: [id number]  HPI The patient is a 41 YO man coming in for chronic pain management (taking hydrocodone for pain, neck and shoulder pain, chronic for many years), and narcotic dependence (on hydrocodone for many years, previously on 180 per month and was cut to 120 per month in the last year, has been struggling since that time and sometimes takes extra and is out for a couple of days), and chronic neck and shoulder pain (does not have insurance and cannot afford to see neurosurgery and has seen them in the past without feeling like they helped him), is able to function on his medications and is able to work in the yard with his medications. Not able to work and is not on disability.   Review of Systems  Constitutional: Negative.   HENT: Negative.   Eyes: Negative.   Respiratory: Negative for cough, chest tightness and shortness of breath.   Cardiovascular: Negative for chest pain, palpitations and leg swelling.  Gastrointestinal: Negative for abdominal distention, abdominal pain, constipation, diarrhea, nausea and vomiting.  Musculoskeletal: Positive for arthralgias and back pain.  Skin: Negative.   Neurological: Positive for numbness.  Psychiatric/Behavioral: Negative.       Objective:   Physical Exam  Constitutional: He is oriented to person, place, and time. He appears well-developed and well-nourished.  overweight  HENT:  Head: Normocephalic and atraumatic.  Eyes: EOM are normal.  Neck: Normal range of motion.  Cardiovascular: Normal rate and regular rhythm.  Pulmonary/Chest: Effort normal and breath sounds normal. No respiratory distress. He has no wheezes. He has no rales.  Abdominal: Soft. Bowel sounds are normal. He exhibits no distension. There is no tenderness. There is no rebound.  Musculoskeletal: He exhibits no edema.  Neurological: He is alert and oriented to person, place, and time.  Coordination normal.  Skin: Skin is warm and dry.  Psychiatric: He has a normal mood and affect.   Vitals:   10/18/17 1049 10/18/17 1117  BP: (!) 150/100 (!) 150/100  Pulse: (!) 119   Temp: 97.9 F (36.6 C)   TempSrc: Oral   SpO2: 99%   Weight: 262 lb (118.8 kg)   Height: 5' 7"  (1.702 m)       Assessment & Plan:

## 2017-10-19 NOTE — Assessment & Plan Note (Signed)
Withdrawal without medication. He denies side effects except mild constipation. Hydrocodone refilled for #150 no refills. Last UDS appropriate. [place] database without red flags.

## 2017-10-21 ENCOUNTER — Other Ambulatory Visit: Payer: Self-pay | Admitting: Internal Medicine

## 2017-11-08 ENCOUNTER — Other Ambulatory Visit: Payer: Self-pay | Admitting: Internal Medicine

## 2017-11-08 NOTE — Telephone Encounter (Signed)
Not due until 11/16/17.

## 2017-11-08 NOTE — Telephone Encounter (Signed)
Left msg on pt vm w/her response...Johny Chess

## 2017-11-08 NOTE — Telephone Encounter (Signed)
Copied from [place] [id number]. Topic: Quick Communication - Rx Refill/Question >> Nov 08, 2017  9:09 AM Lennox Solders wrote: Has the patient contacted their pharmacy? no   (Agent: If no, request that the patient contact the pharmacy for the refill.) pt needs new rx hydrocodone   Preferred Pharmacy (with phone number or street name): [facility] [address]  Agent: Please be advised that RX refills may take up to 3 business days. We ask that you follow-up with your pharmacy.

## 2017-11-09 NOTE — Telephone Encounter (Signed)
Patient called back see CRM below:     Pt called and said that he had 25 day supply.  He want to know why it cant be filled til the 28th?

## 2017-11-15 MED ORDER — HYDROCODONE-ACETAMINOPHEN 7.5-325 MG PO TABS: 1.0000 | ORAL_TABLET | ORAL | 0 refills | Status: DC | PRN

## 2017-11-15 NOTE — Addendum Note (Signed)
Addended by: Pricilla Holm A on: 11/15/2017 12:07 PM   Modules accepted: Orders

## 2017-11-15 NOTE — Telephone Encounter (Signed)
This is a 30 day supply. Although the sig allows 1 pill every 4 hours the amount is for 1 month so he is not able to take every 4 hours daily. He needs to take only as prescribed and cannot take more than recommended. Refill sent in but cannot be filled until tomorrow.

## 2017-11-15 NOTE — Telephone Encounter (Signed)
Pt would like a call tomorrow once refill has been done.

## 2017-11-15 NOTE — Telephone Encounter (Signed)
Notified pt w/[place] response...John Rhodes

## 2017-12-03 ENCOUNTER — Other Ambulatory Visit: Payer: Self-pay | Admitting: Internal Medicine

## 2017-12-04 ENCOUNTER — Other Ambulatory Visit: Payer: Self-pay | Admitting: Internal Medicine

## 2017-12-13 ENCOUNTER — Telehealth: Payer: Self-pay | Admitting: Internal Medicine

## 2017-12-13 NOTE — Telephone Encounter (Signed)
Check [place] registry last filled 11/16/2017.Marland KitchenJohny Chess

## 2017-12-13 NOTE — Telephone Encounter (Signed)
Copied from [place]. Topic: Quick Communication - Rx Refill/Question >> Dec 13, 2017  2:46 PM Yvette Rack wrote: Medication: HYDROcodone-acetaminophen ([place]) 7.5-325 MG tablet   Has the patient contacted their pharmacy? Yes.     (Agent: If no, request that the patient contact the pharmacy for the refill.)  Preferred Pharmacy (with phone number or street name): [id number] [place] [phone](Phone) [phone](Fax     Agent: Please be advised that RX refills may take up to 3 business days. We ask that you follow-up with your pharmacy.

## 2017-12-14 MED ORDER — HYDROCODONE-ACETAMINOPHEN 7.5-325 MG PO TABS: 1.0000 | ORAL_TABLET | ORAL | 0 refills | Status: DC | PRN

## 2017-12-14 NOTE — Telephone Encounter (Signed)
Called pt no answer LMOM rx sent to [facility]...John Rhodes

## 2017-12-14 NOTE — Telephone Encounter (Signed)
Refill sent in

## 2017-12-31 ENCOUNTER — Other Ambulatory Visit: Payer: Self-pay | Admitting: Internal Medicine

## 2018-01-15 ENCOUNTER — Other Ambulatory Visit (INDEPENDENT_AMBULATORY_CARE_PROVIDER_SITE_OTHER): Payer: Self-pay

## 2018-01-15 ENCOUNTER — Ambulatory Visit (INDEPENDENT_AMBULATORY_CARE_PROVIDER_SITE_OTHER): Payer: Self-pay | Admitting: Internal Medicine

## 2018-01-15 ENCOUNTER — Encounter: Payer: Self-pay | Admitting: Internal Medicine

## 2018-01-15 DIAGNOSIS — G8929 Other chronic pain: Secondary | ICD-10-CM

## 2018-01-15 DIAGNOSIS — E1149 Type 2 diabetes mellitus with other diabetic neurological complication: Secondary | ICD-10-CM

## 2018-01-15 DIAGNOSIS — F1120 Opioid dependence, uncomplicated: Secondary | ICD-10-CM

## 2018-01-15 DIAGNOSIS — M549 Dorsalgia, unspecified: Secondary | ICD-10-CM

## 2018-01-15 LAB — LDL CHOLESTEROL, DIRECT: LDL DIRECT: 80 mg/dL

## 2018-01-15 LAB — HEMOGLOBIN A1C: HEMOGLOBIN A1C: 9.6 % — AB (ref 4.6–6.5)

## 2018-01-15 LAB — LIPID PANEL
Cholesterol: 237 mg/dL — ABNORMAL HIGH (ref 0–200)
HDL: 30.2 mg/dL — ABNORMAL LOW (ref >39.00)
Total CHOL/HDL Ratio: 8
Triglycerides: 1281 mg/dL — ABNORMAL HIGH (ref 0.0–149.0)

## 2018-01-15 LAB — COMPREHENSIVE METABOLIC PANEL
ALK PHOS: 70 U/L (ref 39–117)
ALT: 30 U/L (ref 0–53)
AST: 21 U/L (ref 0–37)
Albumin: 4 g/dL (ref 3.5–5.2)
BILIRUBIN TOTAL: 0.4 mg/dL (ref 0.2–1.2)
BUN: 15 mg/dL (ref 6–23)
CALCIUM: 9.6 mg/dL (ref 8.4–10.5)
CO2: 28 mEq/L (ref 19–32)
CREATININE: 0.96 mg/dL (ref 0.40–1.50)
Chloride: 96 mEq/L (ref 96–112)
GFR: 90.43 mL/min (ref >60.00)
Glucose, Bld: 313 mg/dL — ABNORMAL HIGH (ref 70–99)
Potassium: 3.9 mEq/L (ref 3.5–5.1)
Sodium: 134 mEq/L — ABNORMAL LOW (ref 135–145)
TOTAL PROTEIN: 7.2 g/dL (ref 6.0–8.3)

## 2018-01-15 MED ORDER — HYDROCODONE-ACETAMINOPHEN 7.5-325 MG PO TABS: 1.0000 | ORAL_TABLET | ORAL | 0 refills | Status: DC | PRN

## 2018-01-15 NOTE — Progress Notes (Signed)
   Subjective:    Patient ID: John Rhodes, male    DOB: 05-Apr-1976, 42 y.o.   MRN: [id number]  HPI The patient is a 42 YO man coming in for several concerns including chronic pain management (taking hydrocodone 7.5/325 mg up to 5 per day, denies running out early, taking to be functional and still able to do ADLs with medication, denies side effects to medication), and his diabetes (no labs in some time, did not get advised labs done last time, out of medication at this time, taking glipizide and metformin and admits to taking medication from his girlfriend when he cannot afford his as he has no insurance), and his back pain (taking the hydrocodone for this, has tried other options in the past, sometimes he is limited as he has no insurance and no coverage for therapy or back specialist).   [place] narcotic database reviewed and updated  Review of Systems  Constitutional: Positive for activity change. Negative for appetite change, fatigue, fever and unexpected weight change.  HENT: Negative.   Eyes: Negative.   Respiratory: Negative.   Cardiovascular: Negative.   Gastrointestinal: Negative.   Endocrine: Negative.   Musculoskeletal: Positive for back pain and myalgias. Negative for arthralgias.  Skin: Negative.   Neurological: Negative for syncope, weakness and numbness.  Psychiatric/Behavioral: Negative.       Objective:   Physical Exam  Constitutional: He is oriented to person, place, and time. He appears well-developed and well-nourished.  Overweight, smells faintly of cat urine  HENT:  Head: Normocephalic and atraumatic.  Eyes: EOM are normal.  Neck: Normal range of motion.  Cardiovascular: Normal rate and regular rhythm.  Pulmonary/Chest: Effort normal and breath sounds normal. No respiratory distress. He has no wheezes. He has no rales.  Abdominal: Soft. Bowel sounds are normal. He exhibits no distension. There is no tenderness. There is no rebound.  Musculoskeletal: He exhibits  tenderness. He exhibits no edema.  Pain lumbar region  Neurological: He is alert and oriented to person, place, and time. Coordination normal.  Skin: Skin is warm and dry.  Psychiatric: He has a normal mood and affect.   Vitals:   01/15/18 1009  BP: (!) 140/100  Pulse: (!) 105  Temp: 98 F (36.7 C)  TempSrc: Oral  SpO2: 99%  Weight: 264 lb (119.7 kg)  Height: 5' 7"  (1.702 m)      Assessment & Plan:

## 2018-01-15 NOTE — Patient Instructions (Signed)
We will check the labs today and call you back with the results.    

## 2018-01-16 ENCOUNTER — Encounter: Payer: Self-pay | Admitting: Internal Medicine

## 2018-01-16 NOTE — Assessment & Plan Note (Signed)
Has not been taking medications right lately and did not do labs last time. Needs blood work today or we will stop prescribing his other medications. Supposed to be taking glipizide BID and metformin.

## 2018-01-16 NOTE — Assessment & Plan Note (Signed)
Taking #150 per month hydrocodone 7.5/325 mg and understands that any progression of symptoms and he will need to see back specialist for alternative treatment. He is aware and accepts the risk of dependence and increased risk of falls and memory changes.

## 2018-01-16 NOTE — Assessment & Plan Note (Signed)
Taking vicodin #150 per month and doing well. He has controlled contract signed 11/18. Understands our policy and needs visit q 3 months. Taking gabapentin as well which is helping some too. Cannot afford therapy or orthopedics at this time.

## 2018-01-16 NOTE — Assessment & Plan Note (Signed)
Contract last signed 11/18, last UDS 8/18 and appropriate, [place] narcotic database appropriate. Refill done today printed as e-prescribe was down. He is aware of the risk of addiction and dependence and is willing to accept that risk for better functioning.

## 2018-01-17 ENCOUNTER — Other Ambulatory Visit: Payer: Self-pay | Admitting: Internal Medicine

## 2018-01-17 MED ORDER — METFORMIN HCL 1000 MG PO TABS: 500.0000 mg | ORAL_TABLET | Freq: Two times a day (BID) | ORAL | 3 refills | Status: DC

## 2018-01-17 MED ORDER — GLIPIZIDE 5 MG PO TABS: ORAL_TABLET | ORAL | 3 refills | Status: DC

## 2018-01-17 MED ORDER — FENOFIBRATE 145 MG PO TABS: 145.0000 mg | ORAL_TABLET | Freq: Every day | ORAL | 1 refills | Status: DC

## 2018-01-21 ENCOUNTER — Other Ambulatory Visit: Payer: Self-pay | Admitting: Internal Medicine

## 2018-01-30 ENCOUNTER — Other Ambulatory Visit: Payer: Self-pay | Admitting: Internal Medicine

## 2018-01-30 NOTE — Telephone Encounter (Signed)
Routing to dr crawford, please advise, thanks

## 2018-02-11 ENCOUNTER — Telehealth: Payer: Self-pay | Admitting: Internal Medicine

## 2018-02-11 NOTE — Telephone Encounter (Signed)
Refill request for Hydrocodone-acetaminophen  7.5-325 mg tab. Provider : Lesly Rubenstein, MD LOV : 01/15/18 NOV : 04/11/18 Last refill : 01/15/18  Please review.

## 2018-02-11 NOTE — Telephone Encounter (Signed)
Check [place] registry last filled 01/15/2018..lmb

## 2018-02-11 NOTE — Telephone Encounter (Signed)
Copied from [place] [phone]. Topic: Quick Communication - Rx Refill/Question >> Feb 11, 2018  8:54 AM Synthia Innocent wrote: Medication: HYDROcodone-acetaminophen ([place]) 7.5-325 MG tablet Has the patient contacted their pharmacy? Yes.   (Agent: If no, request that the patient contact the pharmacy for the refill.) Preferred Pharmacy (with phone number or street name):[facility] [address] Agent: Please be advised that RX refills may take up to 3 business days. We ask that you follow-up with your pharmacy.

## 2018-02-12 MED ORDER — HYDROCODONE-ACETAMINOPHEN 7.5-325 MG PO TABS: 1.0000 | ORAL_TABLET | ORAL | 0 refills | Status: DC | PRN

## 2018-02-12 NOTE — Telephone Encounter (Signed)
Sent in

## 2018-02-17 ENCOUNTER — Other Ambulatory Visit: Payer: Self-pay | Admitting: Internal Medicine

## 2018-02-18 ENCOUNTER — Other Ambulatory Visit: Payer: Self-pay | Admitting: Internal Medicine

## 2018-02-20 ENCOUNTER — Other Ambulatory Visit: Payer: Self-pay | Admitting: Internal Medicine

## 2018-02-27 ENCOUNTER — Other Ambulatory Visit: Payer: Self-pay | Admitting: Internal Medicine

## 2018-03-12 ENCOUNTER — Other Ambulatory Visit: Payer: Self-pay | Admitting: Internal Medicine

## 2018-03-12 NOTE — Telephone Encounter (Signed)
Copied from [place] [phone]. Topic: Quick Communication - Rx Refill/Question >> Mar 12, 2018 12:20 PM Neva Seat wrote: HYDROcodone-acetaminophen [facility]) 7.5-325 MG tablet  Needing refill  [id number] [id number]: [phone]: [phone]

## 2018-03-13 NOTE — Telephone Encounter (Signed)
Refill request for Hydrocodone-acetaminophen(Norco) 7.51m-325mg tab.   Last filled on 02/12/18 #150 LOV: 01/15/18  PCP: Dr[facility]arlet [facility]    [address]

## 2018-03-13 NOTE — Telephone Encounter (Signed)
Control database checked last refill: 02/12/2018

## 2018-03-14 MED ORDER — HYDROCODONE-ACETAMINOPHEN 7.5-325 MG PO TABS: 1.0000 | ORAL_TABLET | ORAL | 0 refills | Status: DC | PRN

## 2018-03-17 ENCOUNTER — Other Ambulatory Visit: Payer: Self-pay | Admitting: Internal Medicine

## 2018-03-18 ENCOUNTER — Other Ambulatory Visit: Payer: Self-pay | Admitting: Internal Medicine

## 2018-03-21 ENCOUNTER — Other Ambulatory Visit: Payer: Self-pay | Admitting: Internal Medicine

## 2018-03-25 ENCOUNTER — Other Ambulatory Visit: Payer: Self-pay | Admitting: Internal Medicine

## 2018-03-26 ENCOUNTER — Other Ambulatory Visit: Payer: Self-pay | Admitting: Internal Medicine

## 2018-03-28 ENCOUNTER — Other Ambulatory Visit: Payer: Self-pay | Admitting: Internal Medicine

## 2018-04-01 ENCOUNTER — Other Ambulatory Visit: Payer: Self-pay | Admitting: Internal Medicine

## 2018-04-03 ENCOUNTER — Other Ambulatory Visit: Payer: Self-pay | Admitting: Internal Medicine

## 2018-04-04 NOTE — Telephone Encounter (Signed)
Patient following up on pharmacy requesting Glipizide and Gabapentin yesterday. Patient says he's been without glipizide for 2 months. Advised it may take up to 3 business days.

## 2018-04-08 ENCOUNTER — Other Ambulatory Visit: Payer: Self-pay | Admitting: Internal Medicine

## 2018-04-08 NOTE — Telephone Encounter (Signed)
LOV: 01/15/2018 NOV: 04/11/2018 for pain management

## 2018-04-11 ENCOUNTER — Encounter: Payer: Self-pay | Admitting: Internal Medicine

## 2018-04-11 ENCOUNTER — Ambulatory Visit (INDEPENDENT_AMBULATORY_CARE_PROVIDER_SITE_OTHER): Payer: Self-pay | Admitting: Internal Medicine

## 2018-04-11 ENCOUNTER — Other Ambulatory Visit (INDEPENDENT_AMBULATORY_CARE_PROVIDER_SITE_OTHER): Payer: Self-pay

## 2018-04-11 VITALS — BP 126/98 | HR 105 | Temp 98.5°F | Ht 67.0 in | Wt 248.0 lb

## 2018-04-11 DIAGNOSIS — E785 Hyperlipidemia, unspecified: Secondary | ICD-10-CM

## 2018-04-11 DIAGNOSIS — E1169 Type 2 diabetes mellitus with other specified complication: Secondary | ICD-10-CM

## 2018-04-11 DIAGNOSIS — E1165 Type 2 diabetes mellitus with hyperglycemia: Secondary | ICD-10-CM

## 2018-04-11 DIAGNOSIS — E1149 Type 2 diabetes mellitus with other diabetic neurological complication: Secondary | ICD-10-CM

## 2018-04-11 DIAGNOSIS — IMO0002 Reserved for concepts with insufficient information to code with codable children: Secondary | ICD-10-CM

## 2018-04-11 DIAGNOSIS — G8929 Other chronic pain: Secondary | ICD-10-CM

## 2018-04-11 DIAGNOSIS — F1120 Opioid dependence, uncomplicated: Secondary | ICD-10-CM

## 2018-04-11 DIAGNOSIS — M549 Dorsalgia, unspecified: Secondary | ICD-10-CM

## 2018-04-11 LAB — HEMOGLOBIN A1C: HEMOGLOBIN A1C: 10.6 % — AB (ref 4.6–6.5)

## 2018-04-11 MED ORDER — FENOFIBRATE 145 MG PO TABS: 145.0000 mg | ORAL_TABLET | Freq: Every day | ORAL | 1 refills | Status: DC

## 2018-04-11 MED ORDER — HYDROCODONE-ACETAMINOPHEN 7.5-325 MG PO TABS: 1.0000 | ORAL_TABLET | ORAL | 0 refills | Status: DC | PRN
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
## 2018-04-11 NOTE — Patient Instructions (Signed)
We have sent in the refills. We should check the labs today for the sugar.

## 2018-04-12 ENCOUNTER — Other Ambulatory Visit: Payer: Self-pay | Admitting: Internal Medicine

## 2018-04-12 DIAGNOSIS — E1169 Type 2 diabetes mellitus with other specified complication: Secondary | ICD-10-CM | POA: Insufficient documentation

## 2018-04-12 DIAGNOSIS — E785 Hyperlipidemia, unspecified: Secondary | ICD-10-CM

## 2018-04-12 MED ORDER — METFORMIN HCL 1000 MG PO TABS: 1000.0000 mg | ORAL_TABLET | Freq: Two times a day (BID) | ORAL | 3 refills | Status: DC

## 2018-04-12 NOTE — Assessment & Plan Note (Signed)
Rx for fenofibrate printed and asked to take to [facility] where it is available for $9/month or $24/26monthsupply. He will do this.

## 2018-04-12 NOTE — Assessment & Plan Note (Signed)
Taking hydrocodone 7.5/325 up to 5 per day. Last UDS consistent. He is not on disability and cannot afford to see other providers such as ortho currently due to lack of insurance. He does not admit to side effects currently. Pain contract up to date. [place] narcotic database reviewed and no inappropriate fills.

## 2018-04-12 NOTE — Assessment & Plan Note (Signed)
On controlled substance agreement. Last UDS appropriate. He is working on weight loss. He is unable to see any specialists now due to self pay.

## 2018-04-12 NOTE — Assessment & Plan Note (Signed)
UDS last consistent. Pain contract signed. [place] narcotic database without inappropriate fills. Refill done today on hydrocodone 7.5/325 #150 no refills. He is aware of risk of dependence/addiction and is willing to accept that risk.

## 2018-04-12 NOTE — Assessment & Plan Note (Signed)
Checking HgA1c today, has stable neuropathy. Last HgA1c >8 and he was not taking meds well. He is now taking metformin and glipizide better so checking HgA1c and adjust as needed. He does not have insurance and this limits our options for therapy by cost dramatically. He is aware of the long term risks for complications of untreated diabetes.

## 2018-05-08 ENCOUNTER — Other Ambulatory Visit: Payer: Self-pay | Admitting: Internal Medicine

## 2018-05-08 DIAGNOSIS — F1120 Opioid dependence, uncomplicated: Secondary | ICD-10-CM

## 2018-05-08 NOTE — Telephone Encounter (Signed)
Copied from [place] [id number]. Topic: Quick Communication - Rx Refill/Question >> May 08, 2018  3:07 PM Oliver Pila B wrote: Medication: HYDROcodone-acetaminophen ([place]) 7.5-325 MG tablet [[id number]]   Has the patient contacted their pharmacy? Yes.   (Agent: If no, request that the patient contact the pharmacy for the refill.) (Agent: If yes, when and what did the pharmacy advise?)  [facility]  Agent: Please be advised that RX refills may take up to 3 business days. We ask that you follow-up with your pharmacy.

## 2018-05-09 NOTE — Telephone Encounter (Signed)
Rx refill request: hydrocodone acetaminophen 7.5-325 mg   Last filled: 04/11/18 # 150  LOV: 04/11/18  PCP: [place]: verified

## 2018-05-10 MED ORDER — HYDROCODONE-ACETAMINOPHEN 7.5-325 MG PO TABS: 1.0000 | ORAL_TABLET | ORAL | 0 refills | Status: DC | PRN

## 2018-05-17 ENCOUNTER — Other Ambulatory Visit: Payer: Self-pay | Admitting: Internal Medicine

## 2018-05-26 ENCOUNTER — Other Ambulatory Visit: Payer: Self-pay | Admitting: Internal Medicine

## 2018-06-06 ENCOUNTER — Other Ambulatory Visit: Payer: Self-pay | Admitting: Internal Medicine

## 2018-06-06 DIAGNOSIS — F1120 Opioid dependence, uncomplicated: Secondary | ICD-10-CM

## 2018-06-06 MED ORDER — HYDROCODONE-ACETAMINOPHEN 7.5-325 MG PO TABS: 1.0000 | ORAL_TABLET | ORAL | 0 refills | Status: DC | PRN

## 2018-06-06 NOTE — Telephone Encounter (Signed)
Copied from [place] [id number]. Topic: Quick Communication - Rx Refill/Question >> Jun 06, 2018  9:21 AM Bea Graff, NT wrote: Medication: HYDROcodone-acetaminophen ([place]) 7.5-325 MG tablet  Has the patient contacted their pharmacy? Yes.   (Agent: If no, request that the patient contact the pharmacy for the refill.) (Agent: If yes, when and what did the pharmacy advise?)  Preferred Pharmacy (with phone number or street name): [id number] [phone](Phone) [phone](Fax)      Agent: Please be advised that RX refills may take up to 3 business days. We ask that you follow-up with your pharmacy.

## 2018-06-06 NOTE — Telephone Encounter (Signed)
Hydrocodone refill Last Refill:05/10/18 #150 Last OV: 04/11/18 PCP: Dr. Sharlet Salina [facility]

## 2018-06-06 NOTE — Telephone Encounter (Signed)
Check [place] registry last filled 05/11/2018.[name]

## 2018-06-06 NOTE — Telephone Encounter (Signed)
MD approved and sent electronically to pof../lmb  

## 2018-06-29 ENCOUNTER — Other Ambulatory Visit: Payer: Self-pay | Admitting: Internal Medicine

## 2018-07-10 ENCOUNTER — Encounter: Payer: Self-pay | Admitting: Internal Medicine

## 2018-07-10 ENCOUNTER — Other Ambulatory Visit (INDEPENDENT_AMBULATORY_CARE_PROVIDER_SITE_OTHER): Payer: Self-pay

## 2018-07-10 ENCOUNTER — Ambulatory Visit (INDEPENDENT_AMBULATORY_CARE_PROVIDER_SITE_OTHER): Payer: Self-pay | Admitting: Internal Medicine

## 2018-07-10 ENCOUNTER — Telehealth: Payer: Self-pay

## 2018-07-10 VITALS — BP 196/94 | HR 101 | Temp 98.1°F | Ht 67.0 in | Wt 253.0 lb

## 2018-07-10 DIAGNOSIS — I10 Essential (primary) hypertension: Secondary | ICD-10-CM

## 2018-07-10 DIAGNOSIS — E1165 Type 2 diabetes mellitus with hyperglycemia: Secondary | ICD-10-CM

## 2018-07-10 DIAGNOSIS — IMO0002 Reserved for concepts with insufficient information to code with codable children: Secondary | ICD-10-CM

## 2018-07-10 DIAGNOSIS — E1169 Type 2 diabetes mellitus with other specified complication: Secondary | ICD-10-CM

## 2018-07-10 DIAGNOSIS — F1120 Opioid dependence, uncomplicated: Secondary | ICD-10-CM

## 2018-07-10 DIAGNOSIS — E785 Hyperlipidemia, unspecified: Secondary | ICD-10-CM

## 2018-07-10 DIAGNOSIS — E1149 Type 2 diabetes mellitus with other diabetic neurological complication: Secondary | ICD-10-CM

## 2018-07-10 DIAGNOSIS — G8929 Other chronic pain: Secondary | ICD-10-CM

## 2018-07-10 LAB — MICROALBUMIN / CREATININE URINE RATIO
Creatinine,U: 54.4 mg/dL
Microalb Creat Ratio: 4.8 mg/g (ref 0.0–30.0)
Microalb, Ur: 2.6 mg/dL — ABNORMAL HIGH (ref 0.0–1.9)

## 2018-07-10 LAB — HEMOGLOBIN A1C: HEMOGLOBIN A1C: 9.9 % — AB (ref 4.6–6.5)

## 2018-07-10 MED ORDER — LISINOPRIL-HYDROCHLOROTHIAZIDE 20-12.5 MG PO TABS: 1.0000 | ORAL_TABLET | Freq: Every day | ORAL | 3 refills | Status: DC

## 2018-07-10 MED ORDER — FENOFIBRATE 145 MG PO TABS: 145.0000 mg | ORAL_TABLET | Freq: Every day | ORAL | 1 refills | Status: DC

## 2018-07-10 NOTE — Patient Instructions (Signed)
We will check the sugars today and call you back about the results.

## 2018-07-10 NOTE — Progress Notes (Signed)
   Subjective:    Patient ID: John Rhodes, male    DOB: February 14, 1976, 42 y.o.   MRN: [id number]  HPI The patient is a 42 YO man coming in for follow up of chronic pain management (taking hydrocodone up to 5 pills per day, denies overuse or misuse, denies running out early, still having chronic back and shoulder pain, denies new injury), and diabetes (taking metformin and we added glipizide last visit, complicated by neuropathy and taking gabapentin for this, denies low sugars, diet is mediocre), and his opioid dependence (taking hydrocodone per our contract, denies overuse or abuse, denies worsening pain, denies injury).  Review of Systems  Constitutional: Negative.   HENT: Negative.   Eyes: Negative.   Respiratory: Negative for cough, chest tightness and shortness of breath.   Cardiovascular: Negative for chest pain, palpitations and leg swelling.  Gastrointestinal: Negative for abdominal distention, abdominal pain, constipation, diarrhea, nausea and vomiting.  Musculoskeletal: Positive for arthralgias, back pain and myalgias.  Skin: Negative.   Neurological: Positive for numbness.  Psychiatric/Behavioral: Negative.       Objective:   Physical Exam  Constitutional: He is oriented to person, place, and time. He appears well-developed and well-nourished.  HENT:  Head: Normocephalic and atraumatic.  Eyes: EOM are normal.  Neck: Normal range of motion.  Cardiovascular: Normal rate and regular rhythm.  Pulmonary/Chest: Effort normal and breath sounds normal. No respiratory distress. He has no wheezes. He has no rales.  Abdominal: Soft. Bowel sounds are normal. He exhibits no distension. There is no tenderness. There is no rebound.  Musculoskeletal: He exhibits no edema.  Neurological: He is alert and oriented to person, place, and time. Coordination normal.  Skin: Skin is warm and dry.  Psychiatric: He has a normal mood and affect.   Vitals:   07/10/18 1031  BP: (!) 196/94  Pulse:  (!) 101  Temp: 98.1 F (36.7 C)  TempSrc: Oral  SpO2: 95%  Weight: 253 lb (114.8 kg)  Height: 5' 7"  (1.702 m)      Assessment & Plan:

## 2018-07-10 NOTE — Telephone Encounter (Signed)
Copied from [place] [id number]. Topic: General - Other >> Jul 10, 2018 12:24 PM Sheran Luz wrote: Reason for CRM: Pt stated that he had just spoken with a nurse who assured him his HYDROcodone-acetaminophen ([place]) 7.5-325 MG tablet had been refilled and sent to the pharmacy. Pt is at the pharmacy now and the pharmacy says they never received it. Pt was seen in the office today.  Please advise. >> Jul 10, 2018  1:16 PM Synthia Innocent wrote: Patient checking status >> Jul 10, 2018  1:16 PM Synthia Innocent wrote: Please call patient back at [phone]

## 2018-07-10 NOTE — Telephone Encounter (Signed)
Pt called to check on the status of medication refill.  Per office Dr. Sharlet Salina will not be in until tomorrow and this will be addressed then.  Pt states that he is completely out of medication - states he ran out yesterday.  Pt understood and states he would like a call to let him know when the medication has been sent to the pharmacy.

## 2018-07-11 MED ORDER — HYDROCODONE-ACETAMINOPHEN 7.5-325 MG PO TABS: 1.0000 | ORAL_TABLET | ORAL | 0 refills | Status: DC | PRN

## 2018-07-11 NOTE — Addendum Note (Signed)
Addended by: Hoyt Koch on: 07/11/2018 07:38 AM   Modules accepted: Orders

## 2018-07-11 NOTE — Telephone Encounter (Signed)
Sent in but per refill date he should not have run out until 06/09/18 end of day. Is he taking inappropriately?

## 2018-07-12 NOTE — Assessment & Plan Note (Signed)
Taking hydrocodone up to 5 pills per day, refilled after UDS collected. UDS collected today, [place] database reviewed and appropriate. He is aware of the risk and agrees to continue with therapy.

## 2018-07-12 NOTE — Assessment & Plan Note (Signed)
Checking HgA1c and microalbumin. Rx for ACE-I today as this has fallen off his list. Complicated by neuropathy and taking gabapentin for this. Taking metformin 1000 mg bid and last HgA1c uncontrolled and glipizide was added which he admits to taking. Adjust as needed.

## 2018-07-12 NOTE — Assessment & Plan Note (Signed)
Adding back hctz/lisinopril which he was on previously. Am not sure how this fell off his list. BP is high today without symptoms. Needs better control.

## 2018-07-12 NOTE — Assessment & Plan Note (Signed)
[place] database reviewed and appropriate. Contract signed. Denies overuse or misuse. Checking UDS today. Uses for back and shoulder pain. Aware of risk of dependence, addiction, constipation.

## 2018-07-14 LAB — PAIN MGMT, PROFILE 8 W/CONF, U
6 Acetylmorphine: NEGATIVE ng/mL (ref <10)
ALCOHOL METABOLITES: NEGATIVE ng/mL (ref <500)
Amphetamines: NEGATIVE ng/mL (ref <500)
BENZODIAZEPINES: NEGATIVE ng/mL (ref <100)
BUPRENORPHINE, URINE: NEGATIVE ng/mL (ref <5)
Cocaine Metabolite: NEGATIVE ng/mL (ref <150)
Codeine: NEGATIVE ng/mL (ref <50)
Creatinine: 49.9 mg/dL
HYDROCODONE: 67 ng/mL — AB (ref <50)
HYDROMORPHONE: NEGATIVE ng/mL (ref <50)
MARIJUANA METABOLITE: NEGATIVE ng/mL (ref <20)
MDMA: NEGATIVE ng/mL (ref <500)
MORPHINE: NEGATIVE ng/mL (ref <50)
NORHYDROCODONE: 67 ng/mL — AB (ref <50)
Opiates: POSITIVE ng/mL — AB (ref <100)
Oxidant: NEGATIVE ug/mL (ref <200)
Oxycodone: NEGATIVE ng/mL (ref <100)
pH: 5.81 (ref 4.5–9.0)

## 2018-07-17 ENCOUNTER — Other Ambulatory Visit: Payer: Self-pay | Admitting: Internal Medicine

## 2018-07-17 MED ORDER — PIOGLITAZONE HCL 30 MG PO TABS: 30.0000 mg | ORAL_TABLET | Freq: Every day | ORAL | 3 refills | Status: DC

## 2018-08-07 ENCOUNTER — Other Ambulatory Visit: Payer: Self-pay | Admitting: Internal Medicine

## 2018-08-07 DIAGNOSIS — F1120 Opioid dependence, uncomplicated: Secondary | ICD-10-CM

## 2018-08-07 NOTE — Telephone Encounter (Signed)
[place] 7.5-325 refill Last Refill:07/11/18 # 150  0 refills Last OV: 07/10/18 PCP: Sharlet Salina [facility] [phone]

## 2018-08-07 NOTE — Telephone Encounter (Signed)
Copied from [place]. Topic: Quick Communication - Rx Refill/Question >> Aug 07, 2018  1:12 PM Reyne Dumas L wrote: Medication:  HYDROcodone-acetaminophen ([place]) 7.5-325 MG tablet   Has the patient contacted their pharmacy? No - controlled substance (Agent: If no, request that the patient contact the pharmacy for the refill.) (Agent: If yes, when and what did the pharmacy advise?)  Preferred Pharmacy (with phone number or street name): [id number] [phone](Phone) [phone](Fax)  Agent: Please be advised that RX refills may take up to 3 business days. We ask that you follow-up with your pharmacy.

## 2018-08-07 NOTE — Telephone Encounter (Signed)
Control database checked last refill: 07/11/2018

## 2018-08-08 MED ORDER — HYDROCODONE-ACETAMINOPHEN 7.5-325 MG PO TABS: 1.0000 | ORAL_TABLET | ORAL | 0 refills | Status: DC | PRN

## 2018-08-13 ENCOUNTER — Other Ambulatory Visit: Payer: Self-pay | Admitting: Internal Medicine

## 2018-08-15 ENCOUNTER — Other Ambulatory Visit: Payer: Self-pay | Admitting: Internal Medicine

## 2018-08-26 ENCOUNTER — Other Ambulatory Visit: Payer: Self-pay | Admitting: Internal Medicine

## 2018-09-04 ENCOUNTER — Other Ambulatory Visit: Payer: Self-pay | Admitting: Internal Medicine

## 2018-09-04 DIAGNOSIS — F1120 Opioid dependence, uncomplicated: Secondary | ICD-10-CM

## 2018-09-04 NOTE — Telephone Encounter (Signed)
Requested medication (s) are due for refill today: no  Requested medication (s) are on the active medication list: yes    Last refill: 08/10/18  Future visit scheduled no  Notes to clinic: States does not need refill until 09/08/18  Requested Prescriptions  Pending Prescriptions Disp Refills   HYDROcodone-acetaminophen ([place]) 7.5-325 MG tablet 150 tablet 0    Sig: Take 1 tablet by mouth every 4 (four) hours as needed. This is a 30 day supply     Not Delegated - Analgesics:  Opioid Agonist Combinations Failed - 09/04/2018  1:52 PM      Failed - This refill cannot be delegated      Failed - Urine Drug Screen completed in last 360 days.      Passed - Valid encounter within last 6 months    Recent Outpatient Visits          1 month ago Encounter for chronic pain management   [place], Elizabeth A, MD   4 months ago Encounter for chronic pain management   [facility] -Chuck Hint, MD   7 months ago Chronic bilateral back pain, unspecified back location   [place], MD   10 months ago Encounter for chronic pain management   [facility] -Chuck Hint, MD   1 year ago Type 2 diabetes mellitus with neurological manifestations, controlled [facility])   [facility] -Chuck Hint, MD

## 2018-09-04 NOTE — Telephone Encounter (Signed)
Control database checked last refill: 08/10/2018 LOV: 07/10/2018 pain managment BEE:FEOF

## 2018-09-04 NOTE — Telephone Encounter (Signed)
Copied from [place] [phone]. Topic: Quick Communication - See Telephone Encounter >> Sep 04, 2018  1:45 PM Vernona Rieger wrote: CRM for notification. See Telephone encounter for: 09/04/18.  HYDROcodone-acetaminophen ([place]) 7.5-325 MG tablet, patient doesn't need until 10/20  [id number] [place] [id number]

## 2018-09-05 MED ORDER — HYDROCODONE-ACETAMINOPHEN 7.5-325 MG PO TABS: 1.0000 | ORAL_TABLET | ORAL | 0 refills | Status: DC | PRN

## 2018-09-10 IMAGING — DX DG HUMERUS 2V *R*
2 series · 2 of 2 positions shown · non-contrast
Comparison: Report from 02/06/2002

CLINICAL DATA: Right arm pain after fall from 4 feet off a ladder
today. History of remote fracture 10 years ago.

EXAM:
RIGHT HUMERUS - 2+ VIEW

[w humerus ap right (1 of 2)]
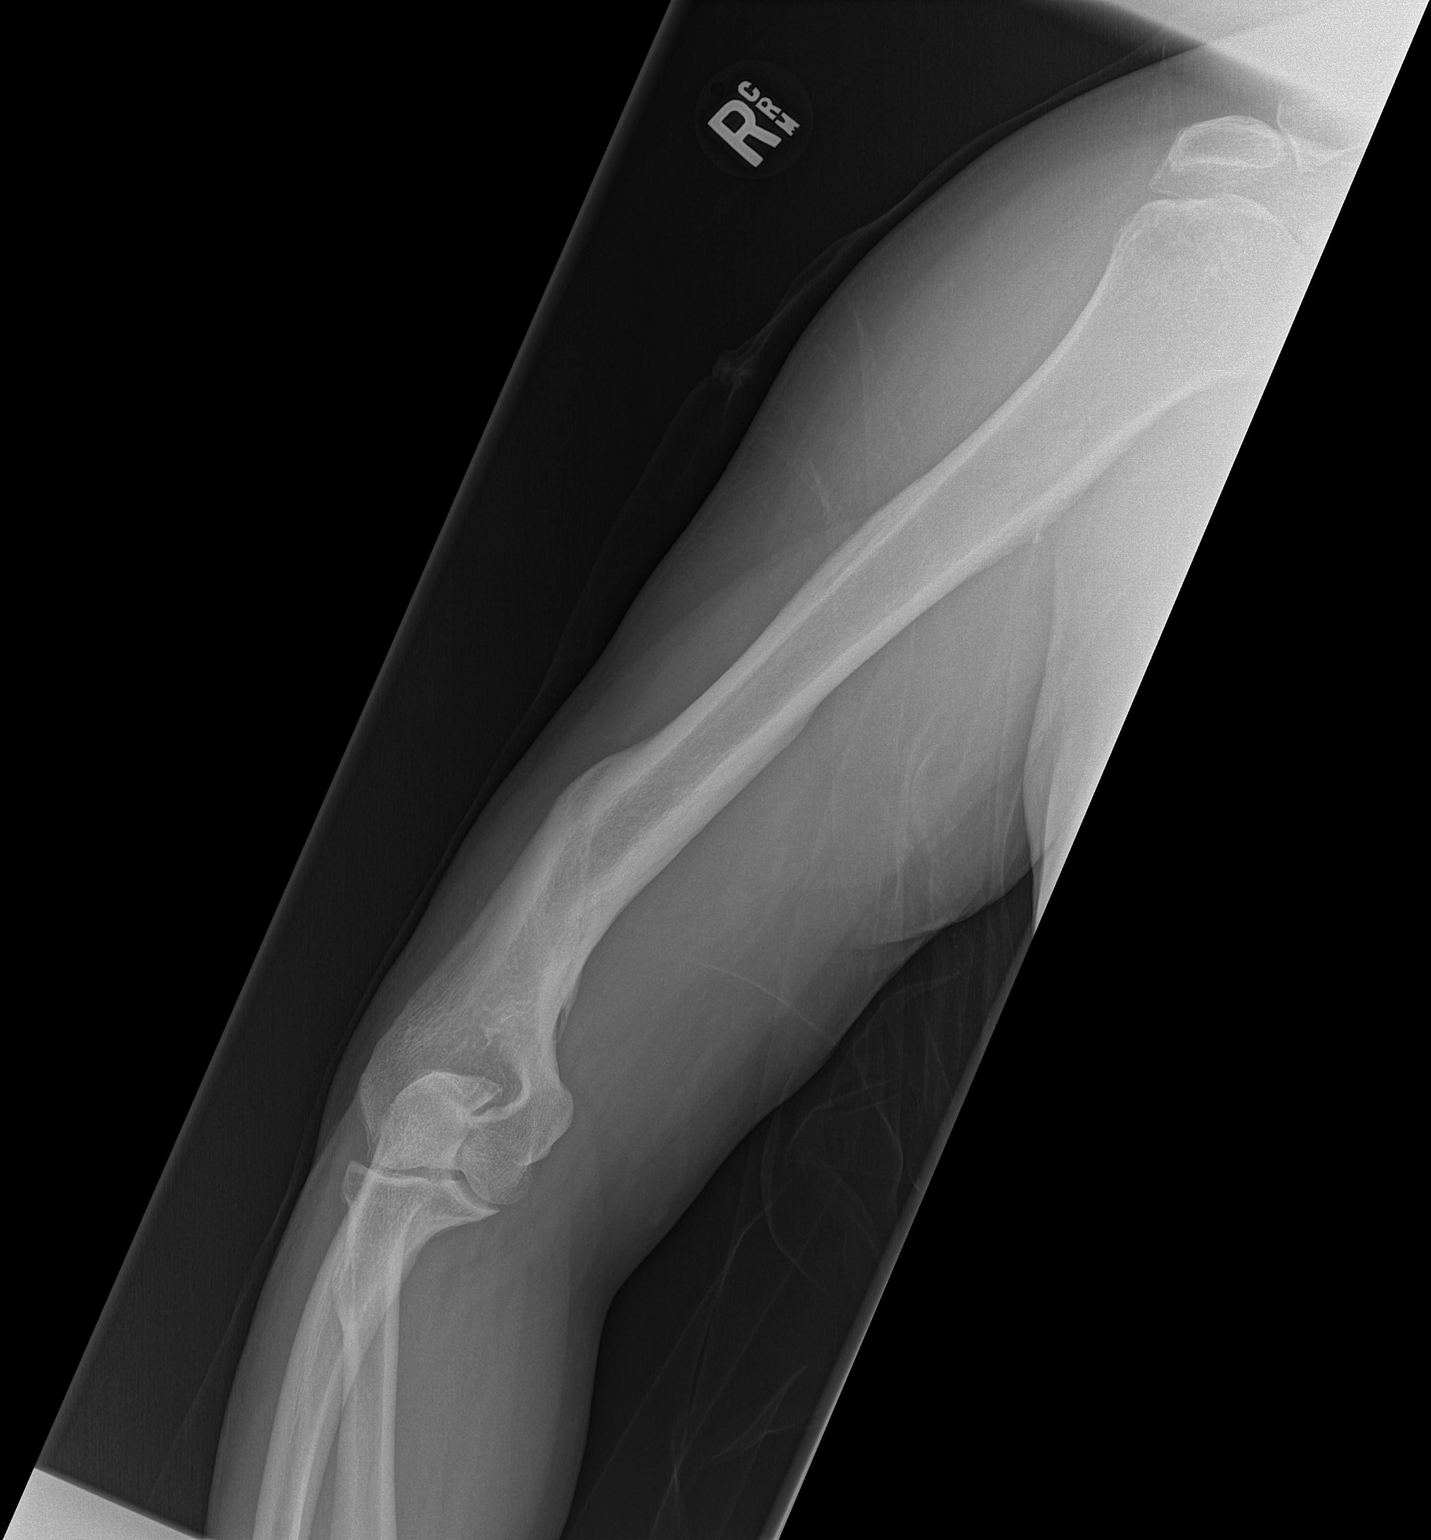

[w humerus ap right (2 of 2)]
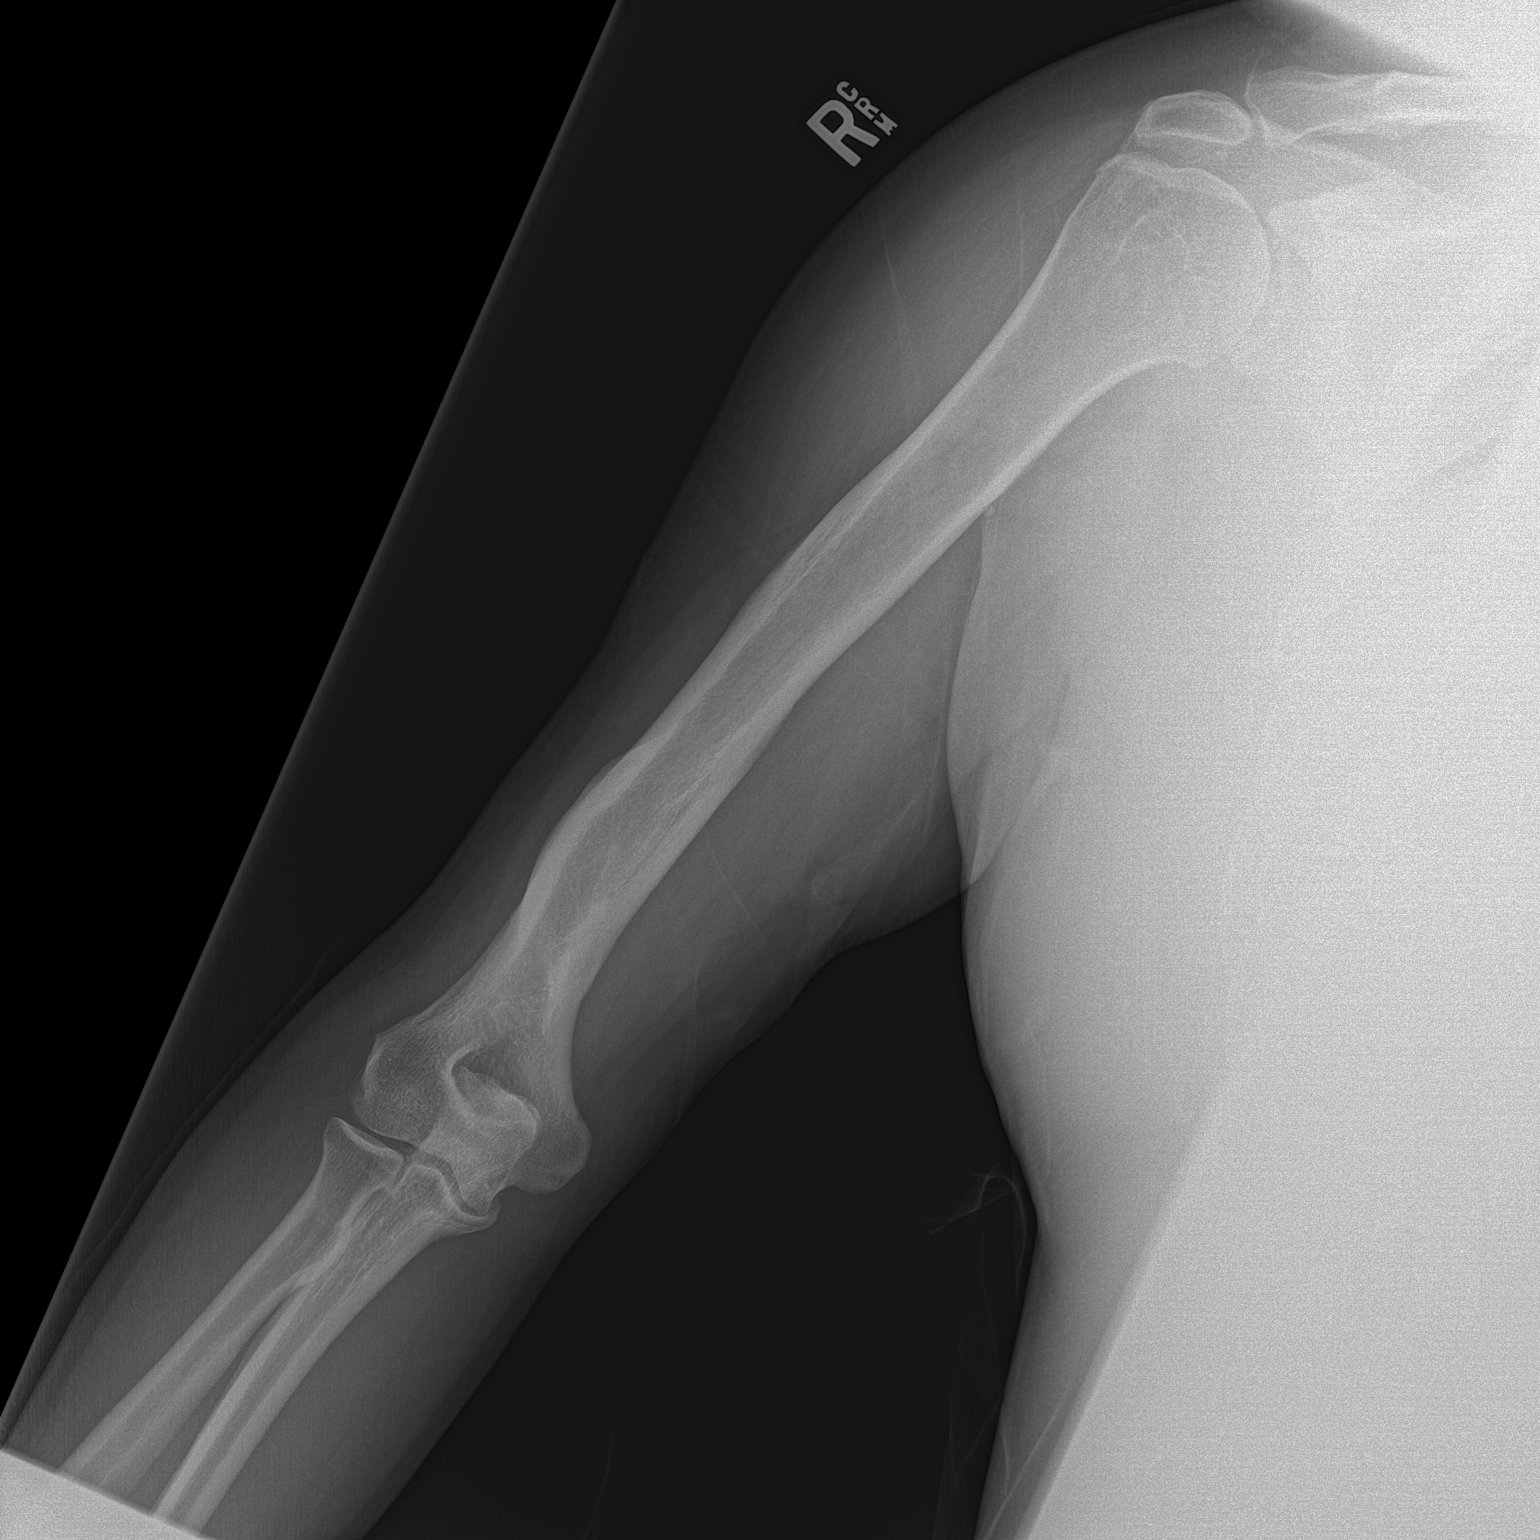

[2 of 2 positions shown; findings below may reference images not displayed]

FINDINGS: Chronic healed fracture deformity of the distal humeral diaphysis is
noted with medial and volar angulation of the distal fracture
fragment. No acute fracture nor joint dislocations. Soft tissues are
unremarkable.
IMPRESSION: No acute fracture nor joint dislocation. Healed fracture deformity
of the distal humeral diaphysis.

## 2018-09-20 ENCOUNTER — Other Ambulatory Visit: Payer: Self-pay | Admitting: Internal Medicine

## 2018-09-23 NOTE — Telephone Encounter (Signed)
Check [place] registry last filled 08/21/2018. Will hold until MD return back in the office tomorrow.[name]

## 2018-10-08 ENCOUNTER — Encounter: Payer: Self-pay | Admitting: Internal Medicine

## 2018-10-08 ENCOUNTER — Other Ambulatory Visit (INDEPENDENT_AMBULATORY_CARE_PROVIDER_SITE_OTHER): Payer: Self-pay

## 2018-10-08 ENCOUNTER — Ambulatory Visit (INDEPENDENT_AMBULATORY_CARE_PROVIDER_SITE_OTHER): Payer: Self-pay | Admitting: Internal Medicine

## 2018-10-08 VITALS — BP 130/84 | HR 115 | Temp 97.6°F | Ht 67.0 in | Wt 255.0 lb

## 2018-10-08 DIAGNOSIS — M549 Dorsalgia, unspecified: Secondary | ICD-10-CM

## 2018-10-08 DIAGNOSIS — E1165 Type 2 diabetes mellitus with hyperglycemia: Secondary | ICD-10-CM

## 2018-10-08 DIAGNOSIS — F1120 Opioid dependence, uncomplicated: Secondary | ICD-10-CM

## 2018-10-08 DIAGNOSIS — IMO0002 Reserved for concepts with insufficient information to code with codable children: Secondary | ICD-10-CM

## 2018-10-08 DIAGNOSIS — E1149 Type 2 diabetes mellitus with other diabetic neurological complication: Secondary | ICD-10-CM

## 2018-10-08 DIAGNOSIS — G8929 Other chronic pain: Secondary | ICD-10-CM

## 2018-10-08 LAB — HEMOGLOBIN A1C: HEMOGLOBIN A1C: 9.4 % — AB (ref 4.6–6.5)

## 2018-10-08 MED ORDER — HYDROCODONE-ACETAMINOPHEN 10-325 MG PO TABS: 1.0000 | ORAL_TABLET | ORAL | 0 refills | Status: DC | PRN

## 2018-10-08 NOTE — Assessment & Plan Note (Signed)
[place] narcotic database appropriate. Last UDS inappropriate and rechecking today. He admits to rarely taking an extra pill but denies running out early.

## 2018-10-08 NOTE — Progress Notes (Signed)
   Subjective:    Patient ID: John Rhodes, male    DOB: [birth year], 42 y.o.   MRN: [id number]  HPI The patient is a 42 YO man coming in for chronic pain management (taking hydrocodone 7.5/325 5 pills daily for #150 per month, denies early fills, admits to taking a few extra pills at time but rationing them so he does not run out early, wants to know if he can increase the dose for winter months as these are worse for his pain, denies constipation, denies drowsiness or confusion, denies falls) and back pain (worse with the cold months, admits to taking a few pills extra sometimes but is careful to limit and keep some for everyday so he does not run out early, denies injury or overuse, denies new numbness or weakness, cannot afford to go to back specialist due to not working due to back pain, did not qualify for disability when he applied due to not enough time working) and uncontrolled diabetes (taking metformin and glipizide, did not fill actos since last visit although he was asked to, denies low or high sugars but does not check, has neuropathy and this is stable, denies vision changes but not up to date on vision exam).   Review of Systems  Constitutional: Positive for activity change. Negative for appetite change, fatigue, fever and unexpected weight change.  HENT: Negative.   Eyes: Negative.   Respiratory: Negative.   Cardiovascular: Negative.   Musculoskeletal: Positive for back pain and myalgias. Negative for arthralgias.  Skin: Negative.   Neurological: Negative for syncope, weakness and numbness.  Psychiatric/Behavioral: Positive for sleep disturbance. Negative for behavioral problems, dysphoric mood and hallucinations. The patient is not hyperactive.       Objective:   Physical Exam  Constitutional: He is oriented to person, place, and time. He appears well-developed and well-nourished.  Overweight, unkempt  HENT:  Head: Normocephalic and atraumatic.  Eyes: EOM are normal.    Neck: Normal range of motion.  Cardiovascular: Normal rate and regular rhythm.  Pulmonary/Chest: Effort normal and breath sounds normal. No respiratory distress. He has no wheezes. He has no rales.  Abdominal: Soft. Bowel sounds are normal. He exhibits no distension. There is no tenderness. There is no rebound.  Musculoskeletal: He exhibits tenderness. He exhibits no edema.  Neurological: He is alert and oriented to person, place, and time. A cranial nerve deficit is present. Coordination normal.  Skin: Skin is warm and dry.  Psychiatric: He has a normal mood and affect.   Vitals:   10/08/18 0910  BP: 130/84  Pulse: (!) 115  Temp: 97.6 F (36.4 C)  TempSrc: Oral  SpO2: 97%  Weight: 255 lb (115.7 kg)  Height: 5' 7"  (1.702 m)      Assessment & Plan:

## 2018-10-08 NOTE — Assessment & Plan Note (Signed)
Checking UDS today as last inappropriate. Checked [place] narcotic database and no inappropriate fills or early. However his xanax was negative on last UDS and filled 09/24/18 so should be in system today. Will increase dosing to hydrocodone 10/325 mg today for same amount per month only for winter months. He understands if UDS persistently abnormal we will not prescribe any controlled for him. He cannot afford to go see back specialist although I feel that they could do something for his pain.

## 2018-10-08 NOTE — Addendum Note (Signed)
Addended by: Pricilla Holm A on: 10/08/2018 10:19 AM   Modules accepted: Orders

## 2018-10-08 NOTE — Assessment & Plan Note (Signed)
He did not get actos advised and encouraged him to get this now. Checking HgA1c and adjust as needed. Taking metformin and glipizide regularly currently.

## 2018-10-08 NOTE — Patient Instructions (Addendum)
We will send in the refill with the change in dose for the hydrocodone.   We will also check the sugars today.    Diabetes Mellitus and Nutrition When you have diabetes (diabetes mellitus), it is very important to have healthy eating habits because your blood sugar (glucose) levels are greatly affected by what you eat and drink. Eating healthy foods in the appropriate amounts, at about the same times every day, can help you:  Control your blood glucose.  Lower your risk of heart disease.  Improve your blood pressure.  Reach or maintain a healthy weight.  Every person with diabetes is different, and each person has different needs for a meal plan. Your health care provider may recommend that you work with a diet and nutrition specialist (dietitian) to make a meal plan that is best for you. Your meal plan may vary depending on factors such as:  The calories you need.  The medicines you take.  Your weight.  Your blood glucose, blood pressure, and cholesterol levels.  Your activity level.  Other health conditions you have, such as heart or kidney disease.  How do carbohydrates affect me? Carbohydrates affect your blood glucose level more than any other type of food. Eating carbohydrates naturally increases the amount of glucose in your blood. Carbohydrate counting is a method for keeping track of how many carbohydrates you eat. Counting carbohydrates is important to keep your blood glucose at a healthy level, especially if you use insulin or take certain oral diabetes medicines. It is important to know how many carbohydrates you can safely have in each meal. This is different for every person. Your dietitian can help you calculate how many carbohydrates you should have at each meal and for snack. Foods that contain carbohydrates include:  Bread, cereal, rice, pasta, and crackers.  Potatoes and corn.  Peas, beans, and lentils.  Milk and yogurt.  Fruit and juice.  Desserts,  such as cakes, cookies, ice cream, and candy.  How does alcohol affect me? Alcohol can cause a sudden decrease in blood glucose (hypoglycemia), especially if you use insulin or take certain oral diabetes medicines. Hypoglycemia can be a life-threatening condition. Symptoms of hypoglycemia (sleepiness, dizziness, and confusion) are similar to symptoms of having too much alcohol. If your health care provider says that alcohol is safe for you, follow these guidelines:  Limit alcohol intake to no more than 1 drink per day for nonpregnant women and 2 drinks per day for men. One drink equals 12 oz of beer, 5 oz of wine, or 1 oz of hard liquor.  Do not drink on an empty stomach.  Keep yourself hydrated with water, diet soda, or unsweetened iced tea.  Keep in mind that regular soda, juice, and other mixers may contain a lot of sugar and must be counted as carbohydrates.  What are tips for following this plan? Reading food labels  Start by checking the serving size on the label. The amount of calories, carbohydrates, fats, and other nutrients listed on the label are based on one serving of the food. Many foods contain more than one serving per package.  Check the total grams (g) of carbohydrates in one serving. You can calculate the number of servings of carbohydrates in one serving by dividing the total carbohydrates by 15. For example, if a food has 30 g of total carbohydrates, it would be equal to 2 servings of carbohydrates.  Check the number of grams (g) of saturated and trans fats in one  serving. Choose foods that have low or no amount of these fats.  Check the number of milligrams (mg) of sodium in one serving. Most people should limit total sodium intake to less than 2,300 mg per day.  Always check the nutrition information of foods labeled as "low-fat" or "nonfat". These foods may be higher in added sugar or refined carbohydrates and should be avoided.  Talk to your dietitian to identify  your daily goals for nutrients listed on the label. Shopping  Avoid buying canned, premade, or processed foods. These foods tend to be high in fat, sodium, and added sugar.  Shop around the outside edge of the grocery store. This includes fresh fruits and vegetables, bulk grains, fresh meats, and fresh dairy. Cooking  Use low-heat cooking methods, such as baking, instead of high-heat cooking methods like deep frying.  Cook using healthy oils, such as olive, canola, or sunflower oil.  Avoid cooking with butter, cream, or high-fat meats. Meal planning  Eat meals and snacks regularly, preferably at the same times every day. Avoid going long periods of time without eating.  Eat foods high in fiber, such as fresh fruits, vegetables, beans, and whole grains. Talk to your dietitian about how many servings of carbohydrates you can eat at each meal.  Eat 4-6 ounces of lean protein each day, such as lean meat, chicken, fish, eggs, or tofu. 1 ounce is equal to 1 ounce of meat, chicken, or fish, 1 egg, or 1/4 cup of tofu.  Eat some foods each day that contain healthy fats, such as avocado, nuts, seeds, and fish. Lifestyle   Check your blood glucose regularly.  Exercise at least 30 minutes 5 or more days each week, or as told by your health care provider.  Take medicines as told by your health care provider.  Do not use any products that contain nicotine or tobacco, such as cigarettes and e-cigarettes. If you need help quitting, ask your health care provider.  Work with a Social worker or diabetes educator to identify strategies to manage stress and any emotional and social challenges. What are some questions to ask my health care provider?  Do I need to meet with a diabetes educator?  Do I need to meet with a dietitian?  What number can I call if I have questions?  When are the best times to check my blood glucose? Where to find more information:  American Diabetes Association:  diabetes.org/food-and-fitness/food  Academy of Nutrition and Dietetics: PokerClues.dk  [employer] of Diabetes and Digestive and Kidney Diseases (NIH): ContactWire.be Summary  A healthy meal plan will help you control your blood glucose and maintain a healthy lifestyle.  Working with a diet and nutrition specialist (dietitian) can help you make a meal plan that is best for you.  Keep in mind that carbohydrates and alcohol have immediate effects on your blood glucose levels. It is important to count carbohydrates and to use alcohol carefully. This information is not intended to replace advice given to you by your health care provider. Make sure you discuss any questions you have with your health care provider. Document Released: 08/03/2005 Document Revised: 12/11/2016 Document Reviewed: 12/11/2016 Elsevier Interactive Patient Education  [employer].

## 2018-10-08 NOTE — Assessment & Plan Note (Signed)
Contract in place and refilling with slight increase in dosing for winter months. His last UDS inappropriate and rechecking today. If inappropriate he will not be able to continue with [place].

## 2018-10-12 LAB — PAIN MGMT, PROFILE 8 W/CONF, U
6 Acetylmorphine: NEGATIVE ng/mL (ref <10)
ALPHAHYDROXYMIDAZOLAM: NEGATIVE ng/mL (ref <50)
ALPHAHYDROXYTRIAZOLAM: NEGATIVE ng/mL (ref <50)
AMINOCLONAZEPAM: NEGATIVE ng/mL (ref <25)
Alcohol Metabolites: NEGATIVE ng/mL (ref <500)
Alphahydroxyalprazolam: 215 ng/mL — ABNORMAL HIGH (ref <25)
Amphetamines: NEGATIVE ng/mL (ref <500)
BUPRENORPHINE, URINE: NEGATIVE ng/mL (ref <5)
Benzodiazepines: POSITIVE ng/mL — AB (ref <100)
COCAINE METABOLITE: NEGATIVE ng/mL (ref <150)
CODEINE: NEGATIVE ng/mL (ref <50)
CREATININE: 153.5 mg/dL
Hydrocodone: 1317 ng/mL — ABNORMAL HIGH (ref <50)
Hydromorphone: 427 ng/mL — ABNORMAL HIGH (ref <50)
Hydroxyethylflurazepam: NEGATIVE ng/mL (ref <50)
LORAZEPAM: NEGATIVE ng/mL (ref <50)
MDMA: NEGATIVE ng/mL (ref <500)
Marijuana Metabolite: NEGATIVE ng/mL (ref <20)
Morphine: NEGATIVE ng/mL (ref <50)
NORHYDROCODONE: 1443 ng/mL — AB (ref <50)
Nordiazepam: NEGATIVE ng/mL (ref <50)
Noroxycodone: NEGATIVE ng/mL (ref <50)
OXAZEPAM: NEGATIVE ng/mL (ref <50)
OXIDANT: NEGATIVE ug/mL (ref <200)
OXYCODONE: NEGATIVE ng/mL (ref <100)
Opiates: POSITIVE ng/mL — AB (ref <100)
Oxycodone: NEGATIVE ng/mL (ref <50)
Oxymorphone: NEGATIVE ng/mL (ref <50)
TEMAZEPAM: NEGATIVE ng/mL (ref <50)
pH: 6.7 (ref 4.5–9.0)

## 2018-10-14 ENCOUNTER — Other Ambulatory Visit: Payer: Self-pay | Admitting: Internal Medicine

## 2018-10-14 DIAGNOSIS — E1165 Type 2 diabetes mellitus with hyperglycemia: Principal | ICD-10-CM

## 2018-10-14 DIAGNOSIS — IMO0002 Reserved for concepts with insufficient information to code with codable children: Secondary | ICD-10-CM

## 2018-10-14 DIAGNOSIS — E1149 Type 2 diabetes mellitus with other diabetic neurological complication: Secondary | ICD-10-CM

## 2018-10-20 ENCOUNTER — Other Ambulatory Visit: Payer: Self-pay | Admitting: Internal Medicine

## 2018-10-21 NOTE — Telephone Encounter (Signed)
Control database checked last refill: 08/21/2018 LOV: 10/08/2018 [id number]

## 2018-10-31 ENCOUNTER — Other Ambulatory Visit: Payer: Self-pay | Admitting: Internal Medicine

## 2018-11-04 ENCOUNTER — Other Ambulatory Visit: Payer: Self-pay | Admitting: Internal Medicine

## 2018-11-04 MED ORDER — HYDROCODONE-ACETAMINOPHEN 10-325 MG PO TABS: 1.0000 | ORAL_TABLET | ORAL | 0 refills | Status: DC | PRN

## 2018-11-04 NOTE — Telephone Encounter (Signed)
Check [place] registry last filled 10/08/2018.[name]

## 2018-11-04 NOTE — Telephone Encounter (Signed)
Copied from [place] [phone]. Topic: Quick Communication - Rx Refill/Question >> Nov 04, 2018  1:21 PM Bea Graff, NT wrote: Medication: HYDROcodone-acetaminophen ([place]) 10-325 MG tablet   Has the patient contacted their pharmacy? Yes.   (Agent: If no, request that the patient contact the pharmacy for the refill.) (Agent: If yes, when and what did the pharmacy advise?)  Preferred Pharmacy (with phone number or street name): [id number] [phone](Phone) [phone](Fax)    Agent: Please be advised that RX refills may take up to 3 business days. We ask that you follow-up with your pharmacy.

## 2018-11-04 NOTE — Telephone Encounter (Signed)
Requested medication (s) are due for refill today: yes  Requested medication (s) are on the active medication list: yes  Last refill:  10/08/18 for 150 tabs  Future visit scheduled: no  Notes to clinic:  Med can not be delegated. Analgesics: opioid Agonist Combinations failed. Controlled medication. Urine drug screen greater than 360 days.  Requested Prescriptions  Pending Prescriptions Disp Refills   HYDROcodone-acetaminophen (NORCO) 10-325 MG tablet 150 tablet 0    Sig: Take 1 tablet by mouth every 4 (four) hours as needed.     Not Delegated - Analgesics:  Opioid Agonist Combinations Failed - 11/04/2018  1:26 PM      Failed - This refill cannot be delegated      Failed - Urine Drug Screen completed in last 360 days.      Passed - Valid encounter within last 6 months    Recent Outpatient Visits          3 weeks ago Encounter for chronic pain management   [place], Elizabeth A, MD   3 months ago Encounter for chronic pain management   [place], MD   6 months ago Encounter for chronic pain management   [facility] -Chuck Hint, MD   9 months ago Chronic bilateral back pain, unspecified back location   [facility] -Chuck Hint, MD   1 year ago Encounter for chronic pain management   [facility] -Chuck Hint, MD

## 2018-11-14 ENCOUNTER — Encounter: Payer: Self-pay | Admitting: Internal Medicine

## 2018-12-04 ENCOUNTER — Other Ambulatory Visit: Payer: Self-pay | Admitting: Internal Medicine

## 2018-12-04 MED ORDER — HYDROCODONE-ACETAMINOPHEN 10-325 MG PO TABS: 1.0000 | ORAL_TABLET | ORAL | 0 refills | Status: DC | PRN

## 2018-12-04 NOTE — Telephone Encounter (Signed)
Called pharmacy Last refill 11/07/2018 150tabs LOV: 10/08/2018 NOV:01/06/2018

## 2018-12-04 NOTE — Telephone Encounter (Signed)
Copied from [place] [phone]. Topic: Quick Communication - Rx Refill/Question >> Dec 04, 2018  9:47 AM Yvette Rack wrote: Medication: HYDROcodone-acetaminophen (NORCO) 10-325 MG tablet  Has the patient contacted their pharmacy? no  Preferred Pharmacy (with phone number or street name): [id number] [phone](Phone)  [phone](Fax)  Agent: Please be advised that RX refills may take up to 3 business days. We ask that you follow-up with your pharmacy.

## 2019-01-06 ENCOUNTER — Ambulatory Visit (INDEPENDENT_AMBULATORY_CARE_PROVIDER_SITE_OTHER): Payer: Self-pay | Admitting: Internal Medicine

## 2019-01-06 ENCOUNTER — Other Ambulatory Visit (INDEPENDENT_AMBULATORY_CARE_PROVIDER_SITE_OTHER): Payer: Self-pay

## 2019-01-06 ENCOUNTER — Encounter: Payer: Self-pay | Admitting: Internal Medicine

## 2019-01-06 VITALS — BP 140/92 | HR 101 | Temp 97.7°F | Ht 67.0 in | Wt 259.0 lb

## 2019-01-06 DIAGNOSIS — IMO0002 Reserved for concepts with insufficient information to code with codable children: Secondary | ICD-10-CM

## 2019-01-06 DIAGNOSIS — M549 Dorsalgia, unspecified: Secondary | ICD-10-CM

## 2019-01-06 DIAGNOSIS — E1165 Type 2 diabetes mellitus with hyperglycemia: Secondary | ICD-10-CM

## 2019-01-06 DIAGNOSIS — E1149 Type 2 diabetes mellitus with other diabetic neurological complication: Secondary | ICD-10-CM

## 2019-01-06 DIAGNOSIS — F1120 Opioid dependence, uncomplicated: Secondary | ICD-10-CM

## 2019-01-06 DIAGNOSIS — G8929 Other chronic pain: Secondary | ICD-10-CM

## 2019-01-06 LAB — HEMOGLOBIN A1C: Hgb A1c MFr Bld: 9.9 % — ABNORMAL HIGH (ref 4.6–6.5)

## 2019-01-06 LAB — LIPID PANEL
Cholesterol: 189 mg/dL (ref 0–200)
HDL: 30.4 mg/dL — ABNORMAL LOW (ref >39.00)
Total CHOL/HDL Ratio: 6
Triglycerides: 469 mg/dL — ABNORMAL HIGH (ref 0.0–149.0)

## 2019-01-06 LAB — LDL CHOLESTEROL, DIRECT: Direct LDL: 108 mg/dL

## 2019-01-06 MED ORDER — HYDROCODONE-ACETAMINOPHEN 10-325 MG PO TABS: 1.0000 | ORAL_TABLET | ORAL | 0 refills | Status: DC | PRN

## 2019-01-06 NOTE — Assessment & Plan Note (Signed)
Pain worse since the winter cold, he is doing better with the change in dosing. He has been working on filling for disability to help with income. Does not feel able to work currently.

## 2019-01-06 NOTE — Patient Instructions (Signed)
We will check the blood work today and call you back about the results.

## 2019-01-06 NOTE — Assessment & Plan Note (Signed)
Taking hydrocodone 10/325 #150 per month. Adequately managing pain currently to reduce pain level so that he can function at home and do ADLs and IADLs. [place] narcotic database reviewed and appropriate. UDS appropriate 09/2018.

## 2019-01-06 NOTE — Assessment & Plan Note (Signed)
Needs HgA1c today.

## 2019-01-06 NOTE — Progress Notes (Signed)
   Subjective:   Patient ID: John Rhodes, male    DOB: 12-03-1975, 43 y.o.   MRN: [id number]  HPI The patient is a 43 YO man coming in for follow up of chronic pain management (takes hydrocodone #150 per month, denies new injury to back, this medication allows him to be active and take care of ADLs and IADLs although he does not work) and back pain (overall worse during the winter months, no recent injury, he does things that he should not to take care of the house, denies injury specifically) and opioid dependence (hydrocodone #150 per month, dose increased to 10/325 at last visit 3 months ago, is doing about the same since that time, winter is harder for pain but this has helped him to be active and take care of household chores).   Review of Systems  Constitutional: Positive for activity change. Negative for appetite change, fatigue, fever and unexpected weight change.  HENT: Negative.   Eyes: Negative.   Respiratory: Negative.  Negative for cough, chest tightness and shortness of breath.   Cardiovascular: Negative.  Negative for chest pain, palpitations and leg swelling.  Gastrointestinal: Negative for abdominal distention, abdominal pain, constipation, diarrhea, nausea and vomiting.  Musculoskeletal: Positive for back pain and myalgias. Negative for arthralgias.  Skin: Negative.   Neurological: Negative.  Negative for syncope, weakness and numbness.  Psychiatric/Behavioral: Negative.     Objective:  Physical Exam Constitutional:      Appearance: He is well-developed.  HENT:     Head: Normocephalic and atraumatic.  Neck:     Musculoskeletal: Normal range of motion.  Cardiovascular:     Rate and Rhythm: Normal rate and regular rhythm.  Pulmonary:     Effort: Pulmonary effort is normal. No respiratory distress.     Breath sounds: Normal breath sounds. No wheezing or rales.  Abdominal:     General: Bowel sounds are normal. There is no distension.     Palpations: Abdomen is  soft.     Tenderness: There is no abdominal tenderness. There is no rebound.  Musculoskeletal:        General: Tenderness present.  Skin:    General: Skin is warm and dry.  Neurological:     Mental Status: He is alert and oriented to person, place, and time.     Coordination: Coordination normal.     Vitals:   01/06/19 1031  BP: (!) 140/92  Pulse: (!) 101  Temp: 97.7 F (36.5 C)  TempSrc: Oral  SpO2: 96%  Weight: 259 lb (117.5 kg)  Height: 5' 7"  (1.702 m)    Assessment & Plan:

## 2019-01-06 NOTE — Assessment & Plan Note (Signed)
[place] narcotic database reviewed and no inappropriate fills. Last UDS appropriate 09/2018. Refill done today. Counseled about risk and benefit and he wishes to continue.

## 2019-01-07 ENCOUNTER — Other Ambulatory Visit: Payer: Self-pay | Admitting: Internal Medicine

## 2019-01-30 ENCOUNTER — Other Ambulatory Visit: Payer: Self-pay | Admitting: Internal Medicine

## 2019-01-30 NOTE — Telephone Encounter (Signed)
Copied from [place] [phone]. Topic: Quick Communication - Rx Refill/Question >> Jan 30, 2019 12:16 PM Rayann Heman wrote: Medication: HYDROcodone-acetaminophen ([place]) 10-325 MG tablet [[id number]]   Has the patient contacted their pharmacy? no Preferred Pharmacy (with phone number or street name):[id number] [phone](Phone) [phone](Fax)   Agent: Please be advised that RX refills may take up to 3 business days. We ask that you follow-up with your pharmacy.

## 2019-01-30 NOTE — Telephone Encounter (Signed)
Control database checked last refill: 01/06/2019  LOV: 01/06/2019 Chronic pain  NOV: none

## 2019-01-31 MED ORDER — HYDROCODONE-ACETAMINOPHEN 10-325 MG PO TABS: 1.0000 | ORAL_TABLET | ORAL | 0 refills | Status: DC | PRN

## 2019-02-12 ENCOUNTER — Other Ambulatory Visit: Payer: Self-pay | Admitting: Internal Medicine

## 2019-02-13 NOTE — Telephone Encounter (Signed)
[place] Controlled Database Checked Last filled: 01/15/19 # 60 LOV w/you: 01/06/19 Next appt w/you: None

## 2019-03-04 ENCOUNTER — Other Ambulatory Visit: Payer: Self-pay | Admitting: Internal Medicine

## 2019-03-04 MED ORDER — HYDROCODONE-ACETAMINOPHEN 10-325 MG PO TABS: 1.0000 | ORAL_TABLET | ORAL | 0 refills | Status: DC | PRN

## 2019-03-04 NOTE — Telephone Encounter (Signed)
Control database checked last refill: 02/14/2019 LOV:01/06/2019 pain management  NOV:04/04/2019

## 2019-03-04 NOTE — Telephone Encounter (Signed)
Requested medication (s) are due for refill today: yes  Requested medication (s) are on the active medication list: yes  Last refill:  01/31/19  Future visit scheduled: yes  Notes to clinic:  Medication not delegated to NT to refill   Requested Prescriptions  Pending Prescriptions Disp Refills   HYDROcodone-acetaminophen ([place]CO) 10-325 MG tablet 150 tablet 0    Sig: Take 1 tablet by mouth every 4 (four) hours as needed.     Not Delegated - Analgesics:  Opioid Agonist Combinations Failed - 03/04/2019  9:09 AM      Failed - This refill cannot be delegated      Passed - Urine Drug Screen completed in last 360 days.      Passed - Valid encounter within last 6 months    Recent Outpatient Visits          1 month ago Uncontrolled diabetes mellitus with neurologic complication, without long-term current use of insulin ([place])   [place], Elizabeth A, MD   4 months ago Encounter for chronic pain management   [place], MD   7 months ago Encounter for chronic pain management   [place], MD   10 months ago Encounter for chronic pain management   [facility] -Chuck Hint, MD   1 year ago Chronic bilateral back pain, unspecified back location   [place], [place], MD      Future Appointments            In 1 month Sharlet [place] Real Cons, MD [place], [facility]

## 2019-03-25 ENCOUNTER — Other Ambulatory Visit: Payer: Self-pay | Admitting: Internal Medicine

## 2019-03-25 NOTE — Telephone Encounter (Signed)
No pharmacies have been sending refills too early

## 2019-03-25 NOTE — Telephone Encounter (Signed)
Should not be due for refill yet. Not due for refill until mid June, is he out?

## 2019-03-27 ENCOUNTER — Other Ambulatory Visit: Payer: Self-pay | Admitting: Internal Medicine

## 2019-04-04 ENCOUNTER — Encounter: Payer: Self-pay | Admitting: Internal Medicine

## 2019-04-04 ENCOUNTER — Ambulatory Visit (INDEPENDENT_AMBULATORY_CARE_PROVIDER_SITE_OTHER): Payer: Self-pay | Admitting: Internal Medicine

## 2019-04-04 DIAGNOSIS — M549 Dorsalgia, unspecified: Secondary | ICD-10-CM

## 2019-04-04 DIAGNOSIS — F419 Anxiety disorder, unspecified: Secondary | ICD-10-CM

## 2019-04-04 DIAGNOSIS — IMO0002 Reserved for concepts with insufficient information to code with codable children: Secondary | ICD-10-CM

## 2019-04-04 DIAGNOSIS — E1149 Type 2 diabetes mellitus with other diabetic neurological complication: Secondary | ICD-10-CM

## 2019-04-04 DIAGNOSIS — E1165 Type 2 diabetes mellitus with hyperglycemia: Secondary | ICD-10-CM

## 2019-04-04 DIAGNOSIS — G8929 Other chronic pain: Secondary | ICD-10-CM

## 2019-04-04 MED ORDER — TRAZODONE HCL 100 MG PO TABS: 100.0000 mg | ORAL_TABLET | Freq: Every day | ORAL | 3 refills | Status: DC

## 2019-04-04 MED ORDER — HYDROCODONE-ACETAMINOPHEN 10-325 MG PO TABS: 1.0000 | ORAL_TABLET | ORAL | 0 refills | Status: DC | PRN

## 2019-04-04 NOTE — Assessment & Plan Note (Signed)
[place] database reviewed and early xanax fill but no early hydrocodone fill. We discussed this at visit.

## 2019-04-04 NOTE — Progress Notes (Signed)
Virtual Visit via Video Note  I connected with Nani Ravens on 04/04/19 at  9:20 AM EDT by a video enabled telemedicine application and verified that I am speaking with the correct person using two identifiers.  The patient and the provider were at separate locations throughout the entire encounter.   I discussed the limitations of evaluation and management by telemedicine and the availability of in person appointments. The patient expressed understanding and agreed to proceed.  History of Present Illness: The patient is a 43 y.o. man with visit for chronic pain management (using hydrocodone up to 5 per day, is having some more pain with the rainy weather, is still able to be functional around the home, does not work, is concerned about covid-19 given his girlfriend's health conditions, denies falls or injury) and diabetes (taking meds more regularly now, has not seen endo as requested, taking metformin and glipizide currently, is checking blood sugar around lunchtime and typically 100-200 lately compared to >500 before, denies significant diet changes) and back pain (worse some with the recent rainy weather, is starting his garden which usually causes seasonal flare) and sleeping (taking xanax up to 4 pills a day, only prescribed 2 per day, taking 2 at night for sleep, has been doing this off and on, has not tried anything else).   Observations/Objective: Appearance: normal, breathing appears normal, casual grooming, abdomen does appear obese but does not appear distended, throat normal, memory normal, mental status is A and O times3  Assessment and Plan: See problem oriented charting  Follow Up Instructions: add trazodone for sleep, refill hydrocodone, needs HgA1c but does not feel safe to come do labs now  I discussed the assessment and treatment plan with the patient. The patient was provided an opportunity to ask questions and all were answered. The patient agreed with the plan and  demonstrated an understanding of the instructions.   The patient was advised to call back or seek an in-person evaluation if the symptoms worsen or if the condition fails to improve as anticipated.  Hoyt Koch, MD

## 2019-04-04 NOTE — Assessment & Plan Note (Signed)
We discussed how taking the xanax inappropriately is not okay with his contract. Will try trazodone for sleep. Given his concurrent hydrocodone we are limited by safe sleep options.

## 2019-04-04 NOTE — Assessment & Plan Note (Signed)
Needs HgA1c but due to pandemic he does not feel comfortable coming for labs. He did not see endo as recommended and states sugars are better controlled at this time. Needs HgA1c when able and adjust as needed then.

## 2019-04-04 NOTE — Assessment & Plan Note (Signed)
Taking hydrocodone 5 pills daily, if any more increase he will need to see back specialist for other options.

## 2019-04-06 ENCOUNTER — Other Ambulatory Visit: Payer: Self-pay | Admitting: Internal Medicine

## 2019-04-08 ENCOUNTER — Other Ambulatory Visit: Payer: Self-pay | Admitting: Internal Medicine

## 2019-04-10 ENCOUNTER — Other Ambulatory Visit: Payer: Self-pay | Admitting: Internal Medicine

## 2019-04-30 ENCOUNTER — Other Ambulatory Visit: Payer: Self-pay | Admitting: Internal Medicine

## 2019-04-30 NOTE — Telephone Encounter (Signed)
Copied from [place] [phone]. Topic: Quick Communication - Rx Refill/Question >> Apr 30, 2019 10:45 AM Richardo Priest, NT wrote: Medication:  HYDROcodone-acetaminophen ([place]) 10-325 MG tablet  Has the patient contacted their pharmacy? Yes advised to call. Patient has about 12 pills left  Preferred Pharmacy (with phone number or street name):  [id number] [phone](Phone) [phone](Fax)  Agent: Please be advised that RX refills may take up to 3 business days. We ask that you follow-up with your pharmacy.

## 2019-04-30 NOTE — Telephone Encounter (Signed)
[place] Controlled Database Checked Last filled: 04/04/19 # 30 LOV w/you: 04/04/19 Next appt w/you: None

## 2019-05-01 MED ORDER — HYDROCODONE-ACETAMINOPHEN 10-325 MG PO TABS: 1.0000 | ORAL_TABLET | ORAL | 0 refills | Status: DC | PRN

## 2019-05-03 ENCOUNTER — Other Ambulatory Visit: Payer: Self-pay | Admitting: Internal Medicine

## 2019-05-05 NOTE — Telephone Encounter (Signed)
Control database checked last refill: 04/09/2019 LOV: 04/04/2019 pain management NOV: none

## 2019-05-26 ENCOUNTER — Other Ambulatory Visit: Payer: Self-pay | Admitting: Internal Medicine

## 2019-05-28 ENCOUNTER — Other Ambulatory Visit: Payer: Self-pay | Admitting: Internal Medicine

## 2019-05-28 NOTE — Telephone Encounter (Signed)
Filled early last month by 4 days so is not due until 06/04/19. Will not fill until next week.

## 2019-05-28 NOTE — Telephone Encounter (Signed)
Control database checked last refill: 05/01/2019 LOV:04/04/2019 pain management  [id number]

## 2019-05-28 NOTE — Telephone Encounter (Signed)
Medication Refill - Medication: HYDROcodone-acetaminophen ([place]) 10-325 MG tablet    Has the patient contacted their pharmacy? Yes.   To contact office (Agent: If no, request that the patient contact the pharmacy for the refill.) (Agent: If yes, when and what did the pharmacy advise?)  Preferred Pharmacy (with phone number or street name): [id number] [place]  Agent: Please be advised that RX refills may take up to 3 business days. We ask that you follow-up with your pharmacy.

## 2019-05-28 NOTE — Telephone Encounter (Signed)
noted 

## 2019-05-30 ENCOUNTER — Telehealth: Payer: Self-pay | Admitting: Emergency Medicine

## 2019-05-30 NOTE — Telephone Encounter (Signed)
Per his contract this is a 30 day supply regardless of what [place] narcotic database states. He filled early last month and is not due until 15th so this is early. He should not take more than prescribed and cannot be refilled early.

## 2019-05-30 NOTE — Telephone Encounter (Signed)
Pt called to inquire about RX refill of Hydrocodone. States he is taking 2 tablets in the morning, 1 in afternoon, 1-2 at night around 8 and 1am. States very rarely he will have to take one in the middle of the night. Per Controlled substance data base he was given a 25 day supple. Pt had 4 pills left. Please Sharyne Peach

## 2019-05-30 NOTE — Telephone Encounter (Signed)
Patient informed of MD response and stated understanding

## 2019-05-30 NOTE — Telephone Encounter (Signed)
error 

## 2019-06-03 NOTE — Telephone Encounter (Signed)
Pt called in to remind Briana to call in his Hydrocodone on the 15th

## 2019-06-04 MED ORDER — HYDROCODONE-ACETAMINOPHEN 10-325 MG PO TABS: 1.0000 | ORAL_TABLET | ORAL | 0 refills | Status: DC | PRN

## 2019-06-04 NOTE — Telephone Encounter (Signed)
Sent in

## 2019-06-04 NOTE — Addendum Note (Signed)
Addended by: Pricilla Holm A on: 06/04/2019 08:04 AM   Modules accepted: Orders

## 2019-06-20 ENCOUNTER — Other Ambulatory Visit: Payer: Self-pay | Admitting: Internal Medicine

## 2019-06-25 ENCOUNTER — Other Ambulatory Visit: Payer: Self-pay | Admitting: Internal Medicine

## 2019-07-01 ENCOUNTER — Other Ambulatory Visit: Payer: Self-pay | Admitting: Internal Medicine

## 2019-07-02 ENCOUNTER — Telehealth: Payer: Self-pay | Admitting: Internal Medicine

## 2019-07-02 NOTE — Telephone Encounter (Signed)
Due for visit for refills per contract.

## 2019-07-02 NOTE — Telephone Encounter (Signed)
Patient has a visit tomorrow will ask about this at visit

## 2019-07-02 NOTE — Telephone Encounter (Signed)
Appointment has been for tomorrow 8/13.

## 2019-07-02 NOTE — Telephone Encounter (Signed)
Control database checked last refill: 06/04/2019 150tabs LOV: 04/04/2019 NOV: none

## 2019-07-02 NOTE — Telephone Encounter (Signed)
Can you make patient a visit please

## 2019-07-02 NOTE — Telephone Encounter (Signed)
Medication Refill - Medication: HYDROcodone-acetaminophen ([place]) 10-325 MG tablet    Preferred Pharmacy (with phone number or street name): [id number] [phone](Phone) [phone](Fax)

## 2019-07-02 NOTE — Telephone Encounter (Signed)
Is he out already? This was sent in 06/20/19 and he should not be taking above the limit as there is a safe limit to gabapentin dosing

## 2019-07-03 ENCOUNTER — Other Ambulatory Visit: Payer: Self-pay | Admitting: Internal Medicine

## 2019-07-03 ENCOUNTER — Other Ambulatory Visit (INDEPENDENT_AMBULATORY_CARE_PROVIDER_SITE_OTHER): Payer: Self-pay

## 2019-07-03 ENCOUNTER — Other Ambulatory Visit: Payer: Self-pay

## 2019-07-03 ENCOUNTER — Ambulatory Visit (INDEPENDENT_AMBULATORY_CARE_PROVIDER_SITE_OTHER): Payer: Self-pay | Admitting: Internal Medicine

## 2019-07-03 ENCOUNTER — Encounter: Payer: Self-pay | Admitting: Internal Medicine

## 2019-07-03 VITALS — BP 142/90 | HR 101 | Temp 97.6°F | Ht 67.0 in | Wt 260.0 lb

## 2019-07-03 DIAGNOSIS — F1120 Opioid dependence, uncomplicated: Secondary | ICD-10-CM

## 2019-07-03 DIAGNOSIS — IMO0002 Reserved for concepts with insufficient information to code with codable children: Secondary | ICD-10-CM

## 2019-07-03 DIAGNOSIS — M549 Dorsalgia, unspecified: Secondary | ICD-10-CM

## 2019-07-03 DIAGNOSIS — G8929 Other chronic pain: Secondary | ICD-10-CM

## 2019-07-03 DIAGNOSIS — E1149 Type 2 diabetes mellitus with other diabetic neurological complication: Secondary | ICD-10-CM

## 2019-07-03 DIAGNOSIS — E1165 Type 2 diabetes mellitus with hyperglycemia: Secondary | ICD-10-CM

## 2019-07-03 LAB — MICROALBUMIN / CREATININE URINE RATIO
Creatinine,U: 125 mg/dL
Microalb Creat Ratio: 3 mg/g (ref 0.0–30.0)
Microalb, Ur: 3.8 mg/dL — ABNORMAL HIGH (ref 0.0–1.9)

## 2019-07-03 LAB — HEMOGLOBIN A1C: Hgb A1c MFr Bld: 10.2 % — ABNORMAL HIGH (ref 4.6–6.5)

## 2019-07-03 MED ORDER — HYDROCODONE-ACETAMINOPHEN 10-325 MG PO TABS: 1.0000 | ORAL_TABLET | ORAL | 0 refills | Status: DC | PRN

## 2019-07-03 NOTE — Progress Notes (Signed)
   Subjective:   Patient ID: John Rhodes, male    DOB: 11-Sep-1976, 43 y.o.   MRN: [id number]  HPI The patient is a 43 YO man coming in for pain management (uses percocet #150 per month for pain, last fill 06/04/19, overall has chronic and stable back pain, not exercising lately, has never seen back specialist for other options due to lack of insurance and they do not want to make apt, is about the same, denies running out early) and back pain (taking gabapentin and percocet, has not seen back specialist in a long time, denies new numbness or tingling) and diabetes (taking only metformin and glipizide, denies new numbness or tingling but has chronic numbness in legs, taking gabapentin and admits to taking more than he should).   Review of Systems  Constitutional: Positive for activity change. Negative for appetite change, fatigue, fever and unexpected weight change.  HENT: Negative.   Eyes: Negative.   Respiratory: Negative.  Negative for cough, chest tightness and shortness of breath.   Cardiovascular: Negative.  Negative for chest pain, palpitations and leg swelling.  Gastrointestinal: Negative for abdominal distention, abdominal pain, constipation, diarrhea, nausea and vomiting.  Musculoskeletal: Positive for back pain and myalgias. Negative for arthralgias.  Skin: Negative.   Neurological: Positive for numbness. Negative for dizziness, syncope and weakness.  Psychiatric/Behavioral: Negative.     Objective:  Physical Exam Constitutional:      Appearance: He is well-developed. He is obese.  HENT:     Head: Normocephalic and atraumatic.  Neck:     Musculoskeletal: Normal range of motion.  Cardiovascular:     Rate and Rhythm: Normal rate and regular rhythm.  Pulmonary:     Effort: Pulmonary effort is normal. No respiratory distress.     Breath sounds: Normal breath sounds. No wheezing or rales.  Abdominal:     General: Bowel sounds are normal. There is no distension.   Palpations: Abdomen is soft.     Tenderness: There is no abdominal tenderness. There is no rebound.  Musculoskeletal:        General: Tenderness present. No swelling.  Skin:    General: Skin is warm and dry.  Neurological:     Mental Status: He is alert and oriented to person, place, and time.     Coordination: Coordination normal.     Vitals:   07/03/19 1010  BP: (!) 142/90  Pulse: (!) 101  Temp: 97.6 F (36.4 C)  TempSrc: Oral  SpO2: 97%  Weight: 260 lb (117.9 kg)  Height: 5' 7"  (1.702 m)    Assessment & Plan:

## 2019-07-03 NOTE — Patient Instructions (Signed)
We will go ahead and check the blood and urine today.

## 2019-07-03 NOTE — Assessment & Plan Note (Signed)
Taking percocet #150 per month. Checked [place] narcotic database without abnormal finding. Checking UDS today as it is due. Refill sent in today of percocet. Advised he cannot overtake the gabapentin as this can be dangerous.

## 2019-07-03 NOTE — Assessment & Plan Note (Signed)
Never saw endo due to financial constraints. Did talk to him today about the serious nature of this disease and the likelihood that this will cause shortened life expectancy. Taking metformin and glipizide. Not taking actos which is prescribed. Taking gabapentin but in excess which is concerning given his other medications.

## 2019-07-03 NOTE — Assessment & Plan Note (Signed)
Taking percocet and gabapentin and cannot see back specialist at this time. He is hoping to get disability and then he would have insurance and could afford to take care of his health. UDS today as due. [place] narcotic database reviewed and updated. Reminded about the addictive nature of his medications and potential side effects.

## 2019-07-04 ENCOUNTER — Other Ambulatory Visit: Payer: Self-pay | Admitting: Internal Medicine

## 2019-07-05 LAB — PAIN MGMT, PROFILE 8 W/CONF, U
6 Acetylmorphine: NEGATIVE ng/mL
Alcohol Metabolites: POSITIVE ng/mL — AB (ref <500)
Alphahydroxyalprazolam: NEGATIVE ng/mL
Alphahydroxymidazolam: NEGATIVE ng/mL
Alphahydroxytriazolam: NEGATIVE ng/mL
Aminoclonazepam: NEGATIVE ng/mL
Amphetamines: NEGATIVE ng/mL
Benzodiazepines: NEGATIVE ng/mL
Buprenorphine, Urine: NEGATIVE ng/mL
Cocaine Metabolite: NEGATIVE ng/mL
Codeine: NEGATIVE ng/mL
Creatinine: 132.4 mg/dL
Ethyl Glucuronide (ETG): 968 ng/mL
Ethyl Sulfate (ETS): 229 ng/mL
Hydrocodone: 449 ng/mL
Hydromorphone: 146 ng/mL
Hydroxyethylflurazepam: NEGATIVE ng/mL
Lorazepam: NEGATIVE ng/mL
MDMA: NEGATIVE ng/mL
Marijuana Metabolite: NEGATIVE ng/mL
Morphine: NEGATIVE ng/mL
Nordiazepam: NEGATIVE ng/mL
Norhydrocodone: 523 ng/mL
Opiates: POSITIVE ng/mL
Oxazepam: NEGATIVE ng/mL
Oxidant: NEGATIVE ug/mL
Oxycodone: NEGATIVE ng/mL
Temazepam: NEGATIVE ng/mL
pH: 6.1 (ref 4.5–9.0)

## 2019-07-18 ENCOUNTER — Other Ambulatory Visit: Payer: Self-pay | Admitting: Internal Medicine

## 2019-07-31 ENCOUNTER — Other Ambulatory Visit: Payer: Self-pay | Admitting: Internal Medicine

## 2019-07-31 NOTE — Telephone Encounter (Signed)
Requested medication (s) are due for refill today: yes  Requested medication (s) are on the active medication list: yes  Last refill:  07/03/2019  Future visit scheduled: no  Notes to clinic:  This refill cannot be delegated    Requested Prescriptions  Pending Prescriptions Disp Refills   HYDROcodone-acetaminophen (NORCO) 10-325 MG tablet 150 tablet 0    Sig: Take 1 tablet by mouth every 4 (four) hours as needed.     Not Delegated - Analgesics:  Opioid Agonist Combinations Failed - 07/31/2019 11:04 AM      Failed - This refill cannot be delegated      Passed - Urine Drug Screen completed in last 360 days.      Passed - Valid encounter within last 6 months    Recent Outpatient Visits          4 weeks ago Opioid type dependence, continuous ([place])   [place], Elizabeth A, MD   3 months ago [place], Elizabeth A, MD   6 months ago Uncontrolled diabetes mellitus with neurologic complication, without long-term current use of insulin [facility])   [place], MD   9 months ago Encounter for chronic pain [facility] -Chuck Hint, MD   1 year ago Encounter for chronic pain management   [facility] -Chuck Hint, MD

## 2019-07-31 NOTE — Telephone Encounter (Signed)
Medication Refill - Medication: HYDROcodone-acetaminophen (NORCO) 10-325 MG tablet   Has the patient contacted their pharmacy? No. Pt states he only has 3 days left. Please advise.  (Agent: If no, request that the patient contact the pharmacy for the refill.) (Agent: If yes, when and what did the pharmacy advise?)  Preferred Pharmacy (with phone number or street name):  [id number] Phone: [phone]: [phone] Not a 24 hour pharmacy; exact hours not known.     Agent: Please be advised that RX refills may take up to 3 business days. We ask that you follow-up with your pharmacy.

## 2019-08-01 MED ORDER — HYDROCODONE-ACETAMINOPHEN 10-325 MG PO TABS: 1.0000 | ORAL_TABLET | ORAL | 0 refills | Status: DC | PRN

## 2019-08-01 NOTE — Telephone Encounter (Signed)
Pt following up on refill request.  Pt states he got all his other meds and is due tomorrow  HYDROcodone-acetaminophen ([place]) 10-325 MG tablet  [id number] [facility] [address] [phone](Phone) [phone](Fax)   Please call pt if there is an issue

## 2019-08-01 NOTE — Telephone Encounter (Signed)
Done erx 

## 2019-08-01 NOTE — Telephone Encounter (Signed)
Pt want to know the status of his pain med refill he said he will be out tonight. Pt want call back from someone

## 2019-08-21 ENCOUNTER — Other Ambulatory Visit: Payer: Self-pay | Admitting: Internal Medicine

## 2019-08-21 ENCOUNTER — Other Ambulatory Visit: Payer: Self-pay | Admitting: Family

## 2019-08-27 ENCOUNTER — Other Ambulatory Visit: Payer: Self-pay | Admitting: Internal Medicine

## 2019-08-27 NOTE — Telephone Encounter (Signed)
HYDROcodone-acetaminophen (NORCO) 10-325 MG tablet   [facility]

## 2019-08-27 NOTE — Telephone Encounter (Signed)
Requested medication (s) are due for refill today: yes  Requested medication (s) are on the active medication list: yes  Last refill:  08/01/2019  Future visit scheduled: yes  Notes to clinic: refill cannot be delegated    Requested Prescriptions  Pending Prescriptions Disp Refills   HYDROcodone-acetaminophen (NORCO) 10-325 MG tablet 150 tablet 0    Sig: Take 1 tablet by mouth every 4 (four) hours as needed.     Not Delegated - Analgesics:  Opioid Agonist Combinations Failed - 08/27/2019  2:38 PM      Failed - This refill cannot be delegated      Passed - Urine Drug Screen completed in last 360 days.      Passed - Valid encounter within last 6 months    Recent Outpatient Visits          1 month ago Opioid type dependence, continuous ([place])   [place], Elizabeth A, MD   4 months ago [place], Elizabeth A, MD   7 months ago Uncontrolled diabetes mellitus with neurologic complication, without long-term current use of insulin [facility])   [place], MD   10 months ago Encounter for chronic pain [facility] -Chuck Hint, MD   1 year ago Encounter for chronic pain management   [place], [place], MD      Future Appointments            In 1 month Sharlet Salina Real Cons, MD [place], [facility]

## 2019-08-28 ENCOUNTER — Other Ambulatory Visit: Payer: Self-pay | Admitting: Family

## 2019-08-28 NOTE — Telephone Encounter (Signed)
Control database checked last refill: 08/01/2019 150 tabs LOV: 07/03/2019 NOV: 10/01/2019

## 2019-08-28 NOTE — Telephone Encounter (Signed)
Should have meds through the 12th, is he out?

## 2019-08-29 NOTE — Telephone Encounter (Signed)
He will be out on the 12th.

## 2019-08-29 NOTE — Telephone Encounter (Signed)
Control database checked last refill: 08/01/2019 150 tabs LOV: 07/03/2019 NOV: 10/01/2019

## 2019-09-01 MED ORDER — HYDROCODONE-ACETAMINOPHEN 10-325 MG PO TABS: 1.0000 | ORAL_TABLET | ORAL | 0 refills | Status: DC | PRN

## 2019-09-01 NOTE — Telephone Encounter (Signed)
Being taken care in another encounter

## 2019-09-01 NOTE — Telephone Encounter (Signed)
Done

## 2019-09-01 NOTE — Addendum Note (Signed)
Addended by: Pricilla Holm A on: 09/01/2019 02:18 PM   Modules accepted: Orders

## 2019-09-01 NOTE — Telephone Encounter (Signed)
Called patient and states [facility]. States that is what is in his pain contract and everything and it is also more cost wise.  Would like it re-sent please

## 2019-09-01 NOTE — Telephone Encounter (Signed)
Please send in

## 2019-09-01 NOTE — Telephone Encounter (Signed)
Sent in, please call and cancel script at alternate location.

## 2019-09-01 NOTE — Telephone Encounter (Signed)
[facility] [address]/ please change

## 2019-09-01 NOTE — Telephone Encounter (Signed)
Tried calling to inform patient but phone rang then went to busy signal. Will route to pec incase calls back

## 2019-09-01 NOTE — Telephone Encounter (Signed)
Should fill where sent in.

## 2019-09-01 NOTE — Telephone Encounter (Signed)
Was already sent in

## 2019-10-01 ENCOUNTER — Other Ambulatory Visit: Payer: Self-pay | Admitting: Internal Medicine

## 2019-10-01 ENCOUNTER — Other Ambulatory Visit: Payer: Self-pay

## 2019-10-01 ENCOUNTER — Encounter: Payer: Self-pay | Admitting: Internal Medicine

## 2019-10-01 ENCOUNTER — Other Ambulatory Visit (INDEPENDENT_AMBULATORY_CARE_PROVIDER_SITE_OTHER): Payer: Self-pay

## 2019-10-01 ENCOUNTER — Ambulatory Visit (INDEPENDENT_AMBULATORY_CARE_PROVIDER_SITE_OTHER): Payer: Self-pay | Admitting: Internal Medicine

## 2019-10-01 VITALS — BP 130/90 | HR 105 | Temp 98.0°F | Ht 67.0 in | Wt 252.0 lb

## 2019-10-01 DIAGNOSIS — E1149 Type 2 diabetes mellitus with other diabetic neurological complication: Secondary | ICD-10-CM

## 2019-10-01 DIAGNOSIS — E1165 Type 2 diabetes mellitus with hyperglycemia: Secondary | ICD-10-CM

## 2019-10-01 DIAGNOSIS — M549 Dorsalgia, unspecified: Secondary | ICD-10-CM

## 2019-10-01 DIAGNOSIS — IMO0002 Reserved for concepts with insufficient information to code with codable children: Secondary | ICD-10-CM

## 2019-10-01 DIAGNOSIS — F1120 Opioid dependence, uncomplicated: Secondary | ICD-10-CM

## 2019-10-01 DIAGNOSIS — G8929 Other chronic pain: Secondary | ICD-10-CM

## 2019-10-01 LAB — HEMOGLOBIN A1C: Hgb A1c MFr Bld: 11.4 % — ABNORMAL HIGH (ref 4.6–6.5)

## 2019-10-01 MED ORDER — ALPRAZOLAM 0.5 MG PO TABS: 0.5000 mg | ORAL_TABLET | Freq: Two times a day (BID) | ORAL | 0 refills | Status: DC | PRN

## 2019-10-01 MED ORDER — GLIPIZIDE 5 MG PO TABS: 5.0000 mg | ORAL_TABLET | Freq: Two times a day (BID) | ORAL | 3 refills | Status: DC

## 2019-10-01 MED ORDER — METFORMIN HCL 1000 MG PO TABS: 1000.0000 mg | ORAL_TABLET | Freq: Two times a day (BID) | ORAL | 3 refills | Status: DC

## 2019-10-01 MED ORDER — HYDROCODONE-ACETAMINOPHEN 10-325 MG PO TABS: 1.0000 | ORAL_TABLET | ORAL | 0 refills | Status: DC | PRN

## 2019-10-01 MED ORDER — LISINOPRIL-HYDROCHLOROTHIAZIDE 20-12.5 MG PO TABS: 1.0000 | ORAL_TABLET | Freq: Every day | ORAL | 3 refills | Status: AC

## 2019-10-01 NOTE — Assessment & Plan Note (Signed)
UDS inappropriate most recent, reminded that he does not get any more chances and that any further inappropriate or alcohol positive and he will not be prescribed any controlled substances.

## 2019-10-01 NOTE — Patient Instructions (Addendum)
We will check the labs today.   We would recommend no alcohol ever due to the other medications we prescribe for you. Drinking alcohol increased your risk of death from taking those medications.

## 2019-10-01 NOTE — Progress Notes (Signed)
   Subjective:   Patient ID: John Rhodes, male    DOB: February 02, 1976, 43 y.o.   MRN: [id number]  HPI The patient is a 43 YO man coming in for follow up of opioid dependence (last UDS inappropriate without xanax and positive for alcohol and hydrocodone, this was not his first inappropriate UDS last inappropriate August 2019, has had 1 appropriate UDS since that time, admits to last taking xanax and hydrocodone on last night and this morning respectively), chronic pain (in the back and neck, does not have financial ability to seek alternative care at neurosurgery, does not have employment and is trying to seek disability currently, uses 5 hydrocodone per day for pain and gabapentin), neck/back pain (taking hydrocodone and gabapentin currently, no recent injury or overuse, worse in the wintertime, denies overuse).   Review of Systems  Constitutional: Negative.   HENT: Negative.   Eyes: Negative.   Respiratory: Negative for cough, chest tightness and shortness of breath.   Cardiovascular: Negative for chest pain, palpitations and leg swelling.  Gastrointestinal: Negative for abdominal distention, abdominal pain, constipation, diarrhea, nausea and vomiting.  Musculoskeletal: Positive for arthralgias, back pain and myalgias.  Skin: Negative.   Neurological: Negative.   Psychiatric/Behavioral: Negative.     Objective:  Physical Exam Constitutional:      Appearance: He is well-developed. He is obese.  HENT:     Head: Normocephalic and atraumatic.  Neck:     Musculoskeletal: Normal range of motion.  Cardiovascular:     Rate and Rhythm: Normal rate and regular rhythm.  Pulmonary:     Effort: Pulmonary effort is normal. No respiratory distress.     Breath sounds: Normal breath sounds. No wheezing or rales.  Abdominal:     General: Bowel sounds are normal. There is no distension.     Palpations: Abdomen is soft.     Tenderness: There is no abdominal tenderness. There is no rebound.   Musculoskeletal:        General: Tenderness present.  Skin:    General: Skin is warm and dry.  Neurological:     Mental Status: He is alert and oriented to person, place, and time.     Coordination: Coordination normal.     Vitals:   10/01/19 0944  BP: 130/90  Pulse: (!) 105  Temp: 98 F (36.7 C)  TempSrc: Oral  SpO2: 98%  Weight: 252 lb (114.3 kg)  Height: 5' 7"  (1.702 m)    Assessment & Plan:

## 2019-10-01 NOTE — Assessment & Plan Note (Signed)
Reviewed recent UDS results with him and explicitly told him he cannot drink alcohol. UDS done today and will likely need a random UDS in the next 1-2 months. If there is another inappropriate UDS he will no longer receive controlled substances from [place]. He is aware.

## 2019-10-01 NOTE — Assessment & Plan Note (Signed)
Refilled hydrocodone today and reviewed [place] narcotic database as well as recent inappropriate UDS.

## 2019-10-03 ENCOUNTER — Other Ambulatory Visit: Payer: Self-pay | Admitting: Internal Medicine

## 2019-10-04 LAB — PAIN MGMT, PROFILE 8 W/CONF, U
6 Acetylmorphine: NEGATIVE ng/mL
Alcohol Metabolites: NEGATIVE ng/mL (ref <500)
Alphahydroxyalprazolam: 40 ng/mL
Alphahydroxymidazolam: NEGATIVE ng/mL
Alphahydroxytriazolam: NEGATIVE ng/mL
Aminoclonazepam: NEGATIVE ng/mL
Amphetamines: NEGATIVE ng/mL
Benzodiazepines: POSITIVE ng/mL
Buprenorphine, Urine: NEGATIVE ng/mL
Cocaine Metabolite: NEGATIVE ng/mL
Codeine: NEGATIVE ng/mL
Creatinine: 80.4 mg/dL
Hydrocodone: 1988 ng/mL
Hydromorphone: 890 ng/mL
Hydroxyethylflurazepam: NEGATIVE ng/mL
Lorazepam: NEGATIVE ng/mL
MDMA: NEGATIVE ng/mL
Marijuana Metabolite: NEGATIVE ng/mL
Morphine: NEGATIVE ng/mL
Nordiazepam: NEGATIVE ng/mL
Norhydrocodone: 1746 ng/mL
Opiates: POSITIVE ng/mL
Oxazepam: NEGATIVE ng/mL
Oxidant: NEGATIVE ug/mL
Oxycodone: NEGATIVE ng/mL
Temazepam: NEGATIVE ng/mL
pH: 4.7 (ref 4.5–9.0)

## 2019-10-06 ENCOUNTER — Other Ambulatory Visit: Payer: Self-pay | Admitting: Internal Medicine

## 2019-10-08 ENCOUNTER — Other Ambulatory Visit: Payer: Self-pay | Admitting: Internal Medicine

## 2019-10-09 ENCOUNTER — Other Ambulatory Visit: Payer: Self-pay | Admitting: Internal Medicine

## 2019-10-16 ENCOUNTER — Other Ambulatory Visit: Payer: Self-pay | Admitting: Internal Medicine

## 2019-10-27 ENCOUNTER — Other Ambulatory Visit: Payer: Self-pay | Admitting: Internal Medicine

## 2019-10-27 NOTE — Telephone Encounter (Signed)
Control database checked last refill: 11/11/202060 tabs LOV: 10/10/19 NOV: none

## 2019-10-28 ENCOUNTER — Other Ambulatory Visit: Payer: Self-pay | Admitting: Internal Medicine

## 2019-10-28 NOTE — Telephone Encounter (Signed)
Pt needs refill on his Hydrocodone 10-325  [facility] [place]

## 2019-10-29 NOTE — Telephone Encounter (Signed)
Control database checked last refill: 10/01/2019 150 tabs LOV: 10/01/2019 VAN:VBTY

## 2019-10-30 MED ORDER — HYDROCODONE-ACETAMINOPHEN 10-325 MG PO TABS: 1.0000 | ORAL_TABLET | ORAL | 0 refills | Status: DC | PRN

## 2019-11-06 ENCOUNTER — Other Ambulatory Visit: Payer: Self-pay | Admitting: Internal Medicine

## 2019-11-06 ENCOUNTER — Other Ambulatory Visit: Payer: Self-pay | Admitting: Family

## 2019-11-27 ENCOUNTER — Other Ambulatory Visit: Payer: Self-pay | Admitting: Internal Medicine

## 2019-11-27 MED ORDER — HYDROCODONE-ACETAMINOPHEN 10-325 MG PO TABS: 1.0000 | ORAL_TABLET | ORAL | 0 refills | Status: DC | PRN

## 2019-11-27 MED ORDER — ALPRAZOLAM 0.5 MG PO TABS: 0.5000 mg | ORAL_TABLET | Freq: Two times a day (BID) | ORAL | 2 refills | Status: DC | PRN

## 2019-11-27 NOTE — Addendum Note (Signed)
Addended by: Pricilla Holm A on: 11/27/2019 03:30 PM   Modules accepted: Orders

## 2019-11-27 NOTE — Telephone Encounter (Signed)
Pt called and is needing his zoloft sent to the [facility] and [facility]     [id number] [place] [address] Phone:: [phone]:: [phone] Not a 24 hour pharmacy; exact hours not known.   [place] [place] - [address]  [address] [place] [address] Phone: [phone]: [phone] Not a 24 hour pharmacy; exact hours not known.

## 2019-11-27 NOTE — Telephone Encounter (Addendum)
Filled 10/30/2019 Alprazolam 0.5 Mg Tablet 60#  Filled 10/30/2019 Hydrocodone-Acetamin 10-325 Mg 150#  Last ov 10/01/19 Next ov 2/29/21

## 2019-12-02 ENCOUNTER — Other Ambulatory Visit: Payer: Self-pay | Admitting: Internal Medicine

## 2019-12-02 ENCOUNTER — Telehealth: Payer: Self-pay

## 2019-12-02 MED ORDER — SERTRALINE HCL 100 MG PO TABS: 100.0000 mg | ORAL_TABLET | Freq: Every day | ORAL | 3 refills | Status: DC

## 2019-12-02 NOTE — Telephone Encounter (Signed)
Filled 10/30/2019 Alprazolam 0.5 Mg Tablet 60#  Last ov 10/01/19 Next ov 12/30/19

## 2019-12-02 NOTE — Telephone Encounter (Signed)
Pt is calling and need zoloft 100 mg twice a day. Pt said he had samples and pt is also having a issue getting alprazolam refill. [facility] [address]. Per pt pharm said a message or fax something last week to md concerning alprazolam

## 2019-12-02 NOTE — Telephone Encounter (Signed)
Will send in zoloft 100 mg daily as this is the starting dose. Since he has not been prescribed this I would not start with 200 mg daily.

## 2019-12-02 NOTE — Telephone Encounter (Signed)
Pt stated that he was prescribed zoloft by you a while back but he didn't have insurance for a short time and was taking samples of zoloft until he was able to get insurance again. Please advise.

## 2019-12-02 NOTE — Telephone Encounter (Signed)
Zoloft is not on his current medication list and he should not take medications he is not prescribed. The xanax was filled last week he will need to contact pharmacy to fill this.

## 2019-12-02 NOTE — Telephone Encounter (Signed)
Pharmacist has been informed that PCP prescribed both and is ok with pt being on both. I also confirmed that pt is been seen continuously by PCP in regard to controlled medication use.

## 2019-12-02 NOTE — Telephone Encounter (Signed)
Copied from [place] [phone]. Topic: General - Other >> Dec 02, 2019  3:17 PM Keene Breath wrote: Reason for CRM: Patient stated that the pharmacy wanted to speak with the doctor before the patient can get his medication.  Please call the pharmacy to discuss at [place] ([place] [place]  Phone:  [phone]  Fax:  [phone].  Please call patient once it has been filled  [place]#[phone]

## 2019-12-02 NOTE — Addendum Note (Signed)
Addended by: Pricilla Holm A on: 12/02/2019 03:53 PM   Modules accepted: Orders

## 2019-12-26 ENCOUNTER — Other Ambulatory Visit: Payer: Self-pay

## 2019-12-26 ENCOUNTER — Encounter: Payer: Self-pay | Admitting: Internal Medicine

## 2019-12-26 ENCOUNTER — Ambulatory Visit (INDEPENDENT_AMBULATORY_CARE_PROVIDER_SITE_OTHER): Payer: Self-pay | Admitting: Internal Medicine

## 2019-12-26 VITALS — BP 138/86 | HR 100 | Temp 98.0°F | Ht 67.0 in | Wt 262.0 lb

## 2019-12-26 DIAGNOSIS — G8929 Other chronic pain: Secondary | ICD-10-CM

## 2019-12-26 DIAGNOSIS — M549 Dorsalgia, unspecified: Secondary | ICD-10-CM

## 2019-12-26 DIAGNOSIS — F1120 Opioid dependence, uncomplicated: Secondary | ICD-10-CM

## 2019-12-26 MED ORDER — HYDROCODONE-ACETAMINOPHEN 10-325 MG PO TABS: 1.0000 | ORAL_TABLET | ORAL | 0 refills | Status: DC | PRN

## 2019-12-26 MED ORDER — SERTRALINE HCL 100 MG PO TABS: 100.0000 mg | ORAL_TABLET | Freq: Every day | ORAL | 3 refills | Status: DC

## 2019-12-26 NOTE — Assessment & Plan Note (Signed)
Refill hydrocodone today #150 per month. Has contract. Prior UDS appropriate but with previous inappropriate. Will need random UDS prior to next visit. [place] narcotic database reviewed and no inappropriate fills.

## 2019-12-26 NOTE — Progress Notes (Signed)
   Subjective:   Patient ID: John Rhodes, male    DOB: 1976-08-27, 44 y.o.   MRN: [id number]  HPI The patient is a 44 YO man coming in for follow up opioid dependence (due to chronic back and neck pain, hydrocodone #150 per month, prior UDS appropriate, has had inappropriate UDS in the past, denies overuse or misuse since that time, not running out early, winter is harder for him with pain due to cold weather, stressed more with covid-19), and back/neck pain (worse in the cold, does stay active in the warmer months, denies falls or injury, pain is stable, using hydrocodone and gabapentin for pain), and chronic pain (does not work and has no financial ability to see neurosurgery at this time, is still pending disability through lawyer, hoping this will go through as he wants to seek care to help with his pain, using gabapentin and hydrocodone for pain).   Review of Systems  Constitutional: Positive for activity change. Negative for appetite change, fatigue, fever and unexpected weight change.  HENT: Negative.   Eyes: Negative.   Respiratory: Negative.  Negative for cough, chest tightness and shortness of breath.   Cardiovascular: Negative.  Negative for chest pain, palpitations and leg swelling.  Gastrointestinal: Negative for abdominal distention, abdominal pain, constipation, diarrhea, nausea and vomiting.  Musculoskeletal: Positive for back pain and myalgias. Negative for arthralgias.  Skin: Negative.   Neurological: Negative.  Negative for syncope, weakness and numbness.  Psychiatric/Behavioral: Negative.     Objective:  Physical Exam Constitutional:      Appearance: He is well-developed. He is obese.  HENT:     Head: Normocephalic and atraumatic.  Cardiovascular:     Rate and Rhythm: Normal rate and regular rhythm.  Pulmonary:     Effort: Pulmonary effort is normal. No respiratory distress.     Breath sounds: Normal breath sounds. No wheezing or rales.  Abdominal:     General:  Bowel sounds are normal. There is no distension.     Palpations: Abdomen is soft.     Tenderness: There is no abdominal tenderness. There is no rebound.  Musculoskeletal:        General: Tenderness present.     Cervical back: Normal range of motion.  Skin:    General: Skin is warm and dry.  Neurological:     Mental Status: He is alert and oriented to person, place, and time.     Coordination: Coordination normal.     Vitals:   12/26/19 0912  BP: 138/86  Pulse: 100  Temp: 98 F (36.7 C)  TempSrc: Oral  SpO2: 99%  Weight: 262 lb (118.8 kg)  Height: 5' 7"  (1.702 m)    This visit occurred during the SARS-CoV-2 public health emergency.  Safety protocols were in place, including screening questions prior to the visit, additional usage of staff PPE, and extensive cleaning of exam room while observing appropriate contact time as indicated for disinfecting solutions.   Assessment & Plan:

## 2019-12-26 NOTE — Assessment & Plan Note (Signed)
Last UDS appropriate, [place] narcotic database appropriate. Refill hydrocodone #150 per month today. Visit in 3 months.

## 2019-12-26 NOTE — Patient Instructions (Signed)
We have sent in the hydrocodone and the zoloft for you.

## 2019-12-26 NOTE — Assessment & Plan Note (Signed)
Taking hydrocodone and gabapentin for pain. Last UDS 09/2019 appropriate. Will need random UDS prior to next visit due to prior inappropriate. Overall stable. [place] narcotic database reviewed without inappropriate or early fills.

## 2019-12-30 ENCOUNTER — Other Ambulatory Visit: Payer: Self-pay | Admitting: Internal Medicine

## 2019-12-30 ENCOUNTER — Ambulatory Visit: Payer: Self-pay | Admitting: Internal Medicine

## 2020-01-16 ENCOUNTER — Other Ambulatory Visit: Payer: Self-pay | Admitting: Internal Medicine

## 2020-01-17 ENCOUNTER — Other Ambulatory Visit: Payer: Self-pay | Admitting: Internal Medicine

## 2020-01-21 ENCOUNTER — Other Ambulatory Visit: Payer: Self-pay | Admitting: Internal Medicine

## 2020-01-21 NOTE — Telephone Encounter (Signed)
Check [place] registry last filled 12/26/2019. MD is out of the office will hold until she return tomorrow for approval since pt isn't due until 3/5.[name]

## 2020-01-21 NOTE — Telephone Encounter (Signed)
Patient is requesting a refill on the following medication.  HYDROcodone-acetaminophen (NORCO) 10-325 MG tablet  [id number] [place] [facility] [address] [place] Phone:  [phone] Fax:  [phone]

## 2020-01-23 ENCOUNTER — Other Ambulatory Visit: Payer: Self-pay | Admitting: Internal Medicine

## 2020-01-23 NOTE — Telephone Encounter (Signed)
12/30/2019 Alprazolam 0.5 Mg Tablet 60#  Last ov 12/26/19 Next ov n/s

## 2020-01-24 ENCOUNTER — Other Ambulatory Visit: Payer: Self-pay | Admitting: Internal Medicine

## 2020-01-26 ENCOUNTER — Other Ambulatory Visit: Payer: Self-pay | Admitting: Internal Medicine

## 2020-01-26 MED ORDER — HYDROCODONE-ACETAMINOPHEN 10-325 MG PO TABS: 1.0000 | ORAL_TABLET | ORAL | 0 refills | Status: DC | PRN

## 2020-01-26 NOTE — Telephone Encounter (Signed)
Pt has been informed to call pharmacy

## 2020-01-26 NOTE — Telephone Encounter (Signed)
12/30/2019 Alprazolam 0.5 Mg Tablet 60#  Last ov 12/26/19 Next ov n/s

## 2020-01-26 NOTE — Telephone Encounter (Signed)
Please stop sending these. It is too early for refill, 3 month supply sent in Jan not due until April he will need to contact pharmacy.

## 2020-01-26 NOTE — Telephone Encounter (Signed)
Patient calling to check status of this refill for hydrocodone-acetaminophen 10/325. Please advise.

## 2020-01-26 NOTE — Addendum Note (Signed)
Addended by: Pricilla Holm A on: 01/26/2020 11:20 AM   Modules accepted: Orders

## 2020-01-27 ENCOUNTER — Other Ambulatory Visit: Payer: Self-pay | Admitting: Internal Medicine

## 2020-01-28 ENCOUNTER — Other Ambulatory Visit: Payer: Self-pay | Admitting: Internal Medicine

## 2020-01-29 ENCOUNTER — Other Ambulatory Visit: Payer: Self-pay | Admitting: Internal Medicine

## 2020-01-29 NOTE — Telephone Encounter (Signed)
New message:    1.Medication Requested: ALPRAZolam (XANAX) 0.5 MG tablet 2. Pharmacy (Name, Street, [place]): [facility] [place] - [address] 3. On Med List: yes  4. Last Visit with PCP: 12/26/19  5. Next visit date with FYB:OFBP  Pt states the pharmacy stated that there were no refills on the last prescription she sent in. Agent: Please be advised that RX refills may take up to 3 business days. We ask that you follow-up with your pharmacy.

## 2020-01-29 NOTE — Telephone Encounter (Signed)
I have verified with the pharmacist that the patient has no refills remaining for Alprazolam. Please advise.

## 2020-01-29 NOTE — Telephone Encounter (Signed)
He is not due as we sent in 1 month with 2 refills in January. Cannot be filled until April.

## 2020-01-30 ENCOUNTER — Other Ambulatory Visit: Payer: Self-pay | Admitting: Internal Medicine

## 2020-01-30 MED ORDER — ALPRAZOLAM 0.5 MG PO TABS: 0.5000 mg | ORAL_TABLET | Freq: Two times a day (BID) | ORAL | 2 refills | Status: DC | PRN

## 2020-01-30 NOTE — Telephone Encounter (Signed)
I have called the pharmacist. The script on file was dated 10/30/19. They did not have a January script on file for the patient. Can you resend?

## 2020-01-30 NOTE — Addendum Note (Signed)
Addended by: Pricilla Holm A on: 01/30/2020 11:06 AM   Modules accepted: Orders

## 2020-02-05 ENCOUNTER — Other Ambulatory Visit: Payer: Self-pay | Admitting: Internal Medicine

## 2020-02-08 ENCOUNTER — Other Ambulatory Visit: Payer: Self-pay | Admitting: Internal Medicine

## 2020-02-09 ENCOUNTER — Other Ambulatory Visit: Payer: Self-pay | Admitting: Internal Medicine

## 2020-02-23 ENCOUNTER — Other Ambulatory Visit: Payer: Self-pay

## 2020-02-23 NOTE — Telephone Encounter (Signed)
1.Medication Requested:HYDROcodone-acetaminophen (NORCO) 10-325 MG tablet  2. Pharmacy (Name[facility] [place] [id number] [place] [address] 3. On Med List: yes   4. Last Visit with PCP: 2.5.2021   5. Next visit date with PCP: no appt is made at this time   Agent: Please be advised that RX refills may take up to 3 business days. We ask that you follow-up with your pharmacy.

## 2020-02-24 NOTE — Telephone Encounter (Signed)
Hydrocodone-Acetamin 10-325 Mg  Checked controlled substance database. Last filled 01/26/2020  Last office visit 12/26/2019 Next Office visit 03/25/2020

## 2020-02-25 ENCOUNTER — Other Ambulatory Visit: Payer: Self-pay | Admitting: Internal Medicine

## 2020-02-25 MED ORDER — HYDROCODONE-ACETAMINOPHEN 10-325 MG PO TABS: 1.0000 | ORAL_TABLET | ORAL | 0 refills | Status: DC | PRN

## 2020-03-21 ENCOUNTER — Other Ambulatory Visit: Payer: Self-pay | Admitting: Internal Medicine

## 2020-03-24 ENCOUNTER — Other Ambulatory Visit: Payer: Self-pay | Admitting: Internal Medicine

## 2020-03-25 ENCOUNTER — Other Ambulatory Visit: Payer: Self-pay

## 2020-03-25 ENCOUNTER — Ambulatory Visit (INDEPENDENT_AMBULATORY_CARE_PROVIDER_SITE_OTHER): Payer: Self-pay | Admitting: Internal Medicine

## 2020-03-25 ENCOUNTER — Encounter: Payer: Self-pay | Admitting: Internal Medicine

## 2020-03-25 VITALS — BP 152/90 | HR 80 | Temp 98.0°F | Wt 261.2 lb

## 2020-03-25 DIAGNOSIS — E785 Hyperlipidemia, unspecified: Secondary | ICD-10-CM

## 2020-03-25 DIAGNOSIS — E1169 Type 2 diabetes mellitus with other specified complication: Secondary | ICD-10-CM

## 2020-03-25 DIAGNOSIS — F1120 Opioid dependence, uncomplicated: Secondary | ICD-10-CM

## 2020-03-25 DIAGNOSIS — F419 Anxiety disorder, unspecified: Secondary | ICD-10-CM

## 2020-03-25 DIAGNOSIS — E1165 Type 2 diabetes mellitus with hyperglycemia: Secondary | ICD-10-CM

## 2020-03-25 DIAGNOSIS — E1149 Type 2 diabetes mellitus with other diabetic neurological complication: Secondary | ICD-10-CM

## 2020-03-25 DIAGNOSIS — IMO0002 Reserved for concepts with insufficient information to code with codable children: Secondary | ICD-10-CM

## 2020-03-25 LAB — COMPREHENSIVE METABOLIC PANEL
ALT: 22 U/L (ref 0–53)
AST: 17 U/L (ref 0–37)
Albumin: 4.2 g/dL (ref 3.5–5.2)
Alkaline Phosphatase: 59 U/L (ref 39–117)
BUN: 13 mg/dL (ref 6–23)
CO2: 24 mEq/L (ref 19–32)
Calcium: 8.7 mg/dL (ref 8.4–10.5)
Chloride: 102 mEq/L (ref 96–112)
Creatinine, Ser: 0.76 mg/dL (ref 0.40–1.50)
GFR: 110.31 mL/min (ref >60.00)
Glucose, Bld: 310 mg/dL — ABNORMAL HIGH (ref 70–99)
Potassium: 4.1 mEq/L (ref 3.5–5.1)
Sodium: 134 mEq/L — ABNORMAL LOW (ref 135–145)
Total Bilirubin: 0.4 mg/dL (ref 0.2–1.2)
Total Protein: 6.7 g/dL (ref 6.0–8.3)

## 2020-03-25 LAB — MICROALBUMIN / CREATININE URINE RATIO
Creatinine,U: 71.5 mg/dL
Microalb Creat Ratio: 3 mg/g (ref 0.0–30.0)
Microalb, Ur: 2.2 mg/dL — ABNORMAL HIGH (ref 0.0–1.9)

## 2020-03-25 LAB — LIPID PANEL
Cholesterol: 147 mg/dL (ref 0–200)
HDL: 31.8 mg/dL — ABNORMAL LOW (ref >39.00)
NonHDL: 114.76
Total CHOL/HDL Ratio: 5
Triglycerides: 323 mg/dL — ABNORMAL HIGH (ref 0.0–149.0)
VLDL: 64.6 mg/dL — ABNORMAL HIGH (ref 0.0–40.0)

## 2020-03-25 LAB — LDL CHOLESTEROL, DIRECT: Direct LDL: 67 mg/dL

## 2020-03-25 LAB — TIQ- AMBIGUOUS ORDER

## 2020-03-25 LAB — HEMOGLOBIN A1C: Hgb A1c MFr Bld: 10 % — ABNORMAL HIGH (ref 4.6–6.5)

## 2020-03-25 MED ORDER — HYDROCODONE-ACETAMINOPHEN 10-325 MG PO TABS: 1.0000 | ORAL_TABLET | ORAL | 0 refills | Status: DC | PRN

## 2020-03-25 MED ORDER — TRAZODONE HCL 100 MG PO TABS: 200.0000 mg | ORAL_TABLET | Freq: Every day | ORAL | 2 refills | Status: DC

## 2020-03-25 NOTE — Progress Notes (Signed)
   Subjective:   Patient ID: John Rhodes, male    DOB: 02/23/1976, 44 y.o.   MRN: [id number]  HPI The patient is a 44 YO man coming in for follow up chronic pain (takes hydrocodone 5 per day, denies overuse, denies alcohol use since we asked him not to, overall back pain is stable, he is able to do more outdoors now that weather is good, denies injuries or falls) and diabetes (he admits diet is poor and not doing well at managing this problem, not very motivated to make changes) and anxiety (using xanax for some panic episodes, taking zoloft as well which has been helping, pandemic has caused this to be worse in the last year than previous).   Review of Systems  Constitutional: Negative.  Negative for appetite change, fatigue, fever and unexpected weight change.  HENT: Negative.   Eyes: Negative.   Respiratory: Negative.  Negative for cough, chest tightness and shortness of breath.   Cardiovascular: Negative.  Negative for chest pain, palpitations and leg swelling.  Gastrointestinal: Negative for abdominal distention, abdominal pain, constipation, diarrhea, nausea and vomiting.  Musculoskeletal: Positive for back pain and myalgias. Negative for arthralgias.  Skin: Negative.   Neurological: Negative.  Negative for syncope, weakness and numbness.  Psychiatric/Behavioral: Negative.     Objective:  Physical Exam Constitutional:      Appearance: He is well-developed. He is obese.  HENT:     Head: Normocephalic and atraumatic.  Cardiovascular:     Rate and Rhythm: Normal rate and regular rhythm.  Pulmonary:     Effort: Pulmonary effort is normal. No respiratory distress.     Breath sounds: Normal breath sounds. No wheezing or rales.  Abdominal:     General: Bowel sounds are normal. There is no distension.     Palpations: Abdomen is soft.     Tenderness: There is no abdominal tenderness. There is no rebound.  Musculoskeletal:        General: Tenderness present.     Cervical back:  Normal range of motion.  Skin:    General: Skin is warm and dry.  Neurological:     Mental Status: He is alert and oriented to person, place, and time.     Coordination: Coordination normal.     Vitals:   03/25/20 1006  BP: (!) 152/90  Pulse: 80  Temp: 98 F (36.7 C)  SpO2: 99%  Weight: 261 lb 3.2 oz (118.5 kg)    This visit occurred during the SARS-CoV-2 public health emergency.  Safety protocols were in place, including screening questions prior to the visit, additional usage of staff PPE, and extensive cleaning of exam room while observing appropriate contact time as indicated for disinfecting solutions.   Assessment & Plan:

## 2020-03-26 ENCOUNTER — Other Ambulatory Visit: Payer: Self-pay | Admitting: Internal Medicine

## 2020-03-26 NOTE — Assessment & Plan Note (Signed)
Needs HgA1c and he is not able to make changes today. Depending on results may need to see endocrinology. Taking metformin and glipizide currently.

## 2020-03-26 NOTE — Assessment & Plan Note (Signed)
Using zoloft and alprazolam for this and under reasonable control. Has been worse overall in the last year due to pandemic.

## 2020-03-26 NOTE — Assessment & Plan Note (Signed)
Due for UDS due to prior inappropriate needs monitoring every 6 months. [place] narcotic database reviewed and no inappropriate fills. Refill hydrocodone at visit today and needs follow up in 3 months.

## 2020-03-31 LAB — DRUG MONITORING, PANEL 8 WITH CONFIRMATION, URINE
6 Acetylmorphine: NEGATIVE ng/mL (ref <10)
Alcohol Metabolites: NEGATIVE ng/mL
Alphahydroxyalprazolam: 30 ng/mL — ABNORMAL HIGH (ref <25)
Alphahydroxymidazolam: NEGATIVE ng/mL (ref <50)
Alphahydroxytriazolam: NEGATIVE ng/mL (ref <50)
Aminoclonazepam: NEGATIVE ng/mL (ref <25)
Amphetamines: NEGATIVE ng/mL (ref <500)
Benzodiazepines: POSITIVE ng/mL — AB (ref <100)
Buprenorphine, Urine: NEGATIVE ng/mL (ref <5)
Cocaine Metabolite: NEGATIVE ng/mL (ref <150)
Codeine: NEGATIVE ng/mL (ref <50)
Creatinine: 75.5 mg/dL
Hydrocodone: 2876 ng/mL — ABNORMAL HIGH (ref <50)
Hydromorphone: 574 ng/mL — ABNORMAL HIGH (ref <50)
Hydroxyethylflurazepam: NEGATIVE ng/mL (ref <50)
Lorazepam: NEGATIVE ng/mL (ref <50)
MDMA: NEGATIVE ng/mL (ref <500)
Marijuana Metabolite: NEGATIVE ng/mL (ref <20)
Morphine: NEGATIVE ng/mL (ref <50)
Nordiazepam: NEGATIVE ng/mL (ref <50)
Norhydrocodone: 1971 ng/mL — ABNORMAL HIGH (ref <50)
Opiates: POSITIVE ng/mL — AB (ref <100)
Oxazepam: NEGATIVE ng/mL (ref <50)
Oxidant: NEGATIVE ug/mL
Oxycodone: NEGATIVE ng/mL (ref <100)
Temazepam: NEGATIVE ng/mL (ref <50)
pH: 5.5 (ref 4.5–9.0)

## 2020-03-31 LAB — DM TEMPLATE

## 2020-04-09 ENCOUNTER — Other Ambulatory Visit: Payer: Self-pay | Admitting: Internal Medicine

## 2020-04-21 ENCOUNTER — Telehealth: Payer: Self-pay | Admitting: Internal Medicine

## 2020-04-21 ENCOUNTER — Other Ambulatory Visit: Payer: Self-pay | Admitting: Internal Medicine

## 2020-04-21 NOTE — Telephone Encounter (Signed)
See message.

## 2020-04-21 NOTE — Telephone Encounter (Signed)
New Message:   1.Medication Requested: HYDROcodone-acetaminophen (NORCO) 10-325 MG tablet 2. Pharmacy (Name, Street, [place]): [id number] [place] 3. On Med List: Yes  4. Last Visit with PCP: 03/25/20  5. Next visit date with PCP: 06/24/20   Pt states he know this medication is not due to fill right now he just wanted to call in advance. He states he knows that it will not be filled right away. Please advise.  Agent: Please be advised that RX refills may take up to 3 business days. We ask that you follow-up with your pharmacy.

## 2020-04-22 MED ORDER — HYDROCODONE-ACETAMINOPHEN 10-325 MG PO TABS: 1.0000 | ORAL_TABLET | ORAL | 0 refills | Status: DC | PRN

## 2020-04-22 NOTE — Telephone Encounter (Signed)
Refilled

## 2020-05-20 ENCOUNTER — Telehealth: Payer: Self-pay | Admitting: Internal Medicine

## 2020-05-20 NOTE — Telephone Encounter (Signed)
04/25/2020 Hydrocodone-Acetamin 10-325 Mg 150#  Last ov 03/25/20 Next on 06/24/20

## 2020-05-20 NOTE — Telephone Encounter (Signed)
New message:   1.Medication Requested: HYDROcodone-acetaminophen (NORCO) 10-325 MG tablet 2. Pharmacy (Name, Street, [place]): [id number] [place] 3. On Med List: yes  4. Last Visit with PCP: 03/25/20  5. Next visit date with PCP: 06/24/20   Agent: Please be advised that RX refills may take up to 3 business days. We ask that you follow-up with your pharmacy.

## 2020-05-21 MED ORDER — HYDROCODONE-ACETAMINOPHEN 10-325 MG PO TABS: 1.0000 | ORAL_TABLET | ORAL | 0 refills | Status: DC | PRN

## 2020-06-17 ENCOUNTER — Other Ambulatory Visit: Payer: Self-pay | Admitting: Internal Medicine

## 2020-06-23 ENCOUNTER — Ambulatory Visit (INDEPENDENT_AMBULATORY_CARE_PROVIDER_SITE_OTHER): Payer: Self-pay | Admitting: Internal Medicine

## 2020-06-23 ENCOUNTER — Other Ambulatory Visit: Payer: Self-pay

## 2020-06-23 ENCOUNTER — Encounter: Payer: Self-pay | Admitting: Internal Medicine

## 2020-06-23 DIAGNOSIS — F1120 Opioid dependence, uncomplicated: Secondary | ICD-10-CM

## 2020-06-23 MED ORDER — ALPRAZOLAM 0.5 MG PO TABS: 0.5000 mg | ORAL_TABLET | Freq: Three times a day (TID) | ORAL | 4 refills | Status: DC | PRN

## 2020-06-23 MED ORDER — HYDROCODONE-ACETAMINOPHEN 10-325 MG PO TABS: 1.0000 | ORAL_TABLET | ORAL | 0 refills | Status: DC | PRN

## 2020-06-23 NOTE — Patient Instructions (Signed)
We have increased the xanax to #75 per month so you can occasional extra.  We have refilled the hydrocodone.

## 2020-06-23 NOTE — Progress Notes (Signed)
   Subjective:   Patient ID: John Rhodes, male    DOB: [birth year], 44 y.o.   MRN: [id number]  HPI The patient is a 44 YO man coming in for pain management. He does have chronic back pain and denies change in symptoms. Denies overuse. Denies alcohol usage. Denies other drug usage, wants to know how we feel about THC usage overall. He used to use this in the past and feels it helps with his pain. Is active and manages a garden in the summer.   Review of Systems  Constitutional: Positive for activity change. Negative for appetite change, fatigue, fever and unexpected weight change.  Respiratory: Negative.   Cardiovascular: Negative.   Musculoskeletal: Positive for back pain and myalgias. Negative for arthralgias.  Skin: Negative.   Neurological: Negative for syncope, weakness and numbness.    Objective:  Physical Exam Constitutional:      Appearance: He is well-developed. He is obese.  HENT:     Head: Normocephalic and atraumatic.  Cardiovascular:     Rate and Rhythm: Normal rate and regular rhythm.  Pulmonary:     Effort: Pulmonary effort is normal. No respiratory distress.     Breath sounds: Normal breath sounds. No wheezing or rales.  Abdominal:     General: Bowel sounds are normal. There is no distension.     Palpations: Abdomen is soft.     Tenderness: There is no abdominal tenderness. There is no rebound.  Musculoskeletal:        General: Tenderness present.     Cervical back: Normal range of motion.  Skin:    General: Skin is warm and dry.  Neurological:     Mental Status: He is alert and oriented to person, place, and time.     Coordination: Coordination normal.     Vitals:   06/23/20 0943  BP: (!) 142/86  Pulse: 99  Temp: 98.3 F (36.8 C)  TempSrc: Oral  SpO2: 97%  Weight: 261 lb (118.4 kg)  Height: 5' 7"  (1.702 m)    This visit occurred during the SARS-CoV-2 public health emergency.  Safety protocols were in place, including screening questions prior to  the visit, additional usage of staff PPE, and extensive cleaning of exam room while observing appropriate contact time as indicated for disinfecting solutions.   Assessment & Plan:

## 2020-06-24 ENCOUNTER — Ambulatory Visit: Payer: Self-pay | Admitting: Internal Medicine

## 2020-06-24 NOTE — Assessment & Plan Note (Signed)
Given that he is also on concurrent xanax have asked that he either can use THC and stop xanax or not use THC and continue xanax. He wishes to continue with xanax. Refilled hydrocodone and xanax today. [place] narcotic database appropriate. Will be due for next UDS Nov/Dec 2021 due to prior irregularity.

## 2020-07-09 ENCOUNTER — Other Ambulatory Visit: Payer: Self-pay | Admitting: Internal Medicine

## 2020-07-21 ENCOUNTER — Telehealth: Payer: Self-pay | Admitting: Internal Medicine

## 2020-07-21 MED ORDER — HYDROCODONE-ACETAMINOPHEN 10-325 MG PO TABS
1.0000 | ORAL_TABLET | ORAL | 0 refills | Status: DC | PRN
Start: 2020-07-21 — End: 2020-08-17

## 2020-07-21 NOTE — Addendum Note (Signed)
Addended by: Binnie Rail on: 07/21/2020 10:46 AM   Modules accepted: Orders

## 2020-07-21 NOTE — Telephone Encounter (Signed)
1.Medication Requested: HYDROcodone-acetaminophen (NORCO) 10-325 MG tablet  2. Pharmacy (Name, Street, City):[id number] [place]  3. On Med List: yes  4. Last Visit with PCP: 06/23/20  5. Next visit date with PCP: n/a   Agent: Please be advised that RX refills may take up to 3 business days. We ask that you follow-up with your pharmacy.

## 2020-07-29 ENCOUNTER — Other Ambulatory Visit: Payer: Self-pay | Admitting: Internal Medicine

## 2020-08-12 ENCOUNTER — Other Ambulatory Visit: Payer: Self-pay | Admitting: Internal Medicine

## 2020-08-17 ENCOUNTER — Telehealth: Payer: Self-pay | Admitting: Internal Medicine

## 2020-08-17 MED ORDER — HYDROCODONE-ACETAMINOPHEN 10-325 MG PO TABS
1.0000 | ORAL_TABLET | ORAL | 0 refills | Status: DC | PRN
Start: 2020-08-17 — End: 2020-09-15

## 2020-08-17 NOTE — Telephone Encounter (Signed)
HYDROcodone-acetaminophen (NORCO) 10-325 MG tablet  [id number] [place] [address] Phone:  [phone] Fax:  [phone]    Last appt: 8.5.21 Next appt: Patient to call back and schedule in November 2021

## 2020-08-17 NOTE — Addendum Note (Signed)
Addended by: Binnie Rail on: 08/17/2020 11:55 AM   Modules accepted: Orders

## 2020-08-27 ENCOUNTER — Other Ambulatory Visit: Payer: Self-pay | Admitting: Internal Medicine

## 2020-09-15 ENCOUNTER — Telehealth: Payer: Self-pay | Admitting: Internal Medicine

## 2020-09-15 MED ORDER — HYDROCODONE-ACETAMINOPHEN 10-325 MG PO TABS
1.0000 | ORAL_TABLET | ORAL | 0 refills | Status: DC | PRN
Start: 2020-09-15 — End: 2020-10-19

## 2020-09-15 NOTE — Addendum Note (Signed)
Addended by: Sherlene Shams on: 09/15/2020 12:30 PM   Modules accepted: Orders

## 2020-09-15 NOTE — Telephone Encounter (Signed)
HYDROcodone-acetaminophen (NORCO) 10-325 MG tablet [id number] [place] [address] Phone:  [phone] Fax:  [phone]    Last seen-06/24/20 Requesting a refill

## 2020-09-15 NOTE — Telephone Encounter (Signed)
Please make appointment with Dr. Sharlet Salina for next month; last OV was in August 2021;

## 2020-09-16 NOTE — Telephone Encounter (Signed)
Pt aware that Mickel Baas as refilled his medication.  Next OV scheduled for 11/24 with Dr Sharlet Salina.

## 2020-10-04 ENCOUNTER — Other Ambulatory Visit: Payer: Self-pay | Admitting: Internal Medicine

## 2020-10-06 ENCOUNTER — Other Ambulatory Visit: Payer: Self-pay

## 2020-10-06 MED ORDER — TRAZODONE HCL 100 MG PO TABS
200.0000 mg | ORAL_TABLET | Freq: Every day | ORAL | 0 refills | Status: DC
Start: 2020-10-06 — End: 2020-10-13

## 2020-10-06 MED ORDER — GABAPENTIN 300 MG PO CAPS
ORAL_CAPSULE | ORAL | 0 refills | Status: DC
Start: 2020-10-06 — End: 2020-10-13

## 2020-10-11 ENCOUNTER — Other Ambulatory Visit: Payer: Self-pay

## 2020-10-11 NOTE — Telephone Encounter (Signed)
Was refilled on 17th should not be out.

## 2020-10-13 ENCOUNTER — Ambulatory Visit (INDEPENDENT_AMBULATORY_CARE_PROVIDER_SITE_OTHER): Payer: Self-pay | Admitting: Internal Medicine

## 2020-10-13 ENCOUNTER — Encounter: Payer: Self-pay | Admitting: Internal Medicine

## 2020-10-13 ENCOUNTER — Other Ambulatory Visit: Payer: Self-pay

## 2020-10-13 ENCOUNTER — Telehealth: Payer: Self-pay | Admitting: Internal Medicine

## 2020-10-13 VITALS — BP 168/110 | HR 99 | Temp 98.0°F | Ht 67.0 in | Wt 254.0 lb

## 2020-10-13 DIAGNOSIS — F1120 Opioid dependence, uncomplicated: Secondary | ICD-10-CM

## 2020-10-13 DIAGNOSIS — IMO0002 Reserved for concepts with insufficient information to code with codable children: Secondary | ICD-10-CM

## 2020-10-13 DIAGNOSIS — E1149 Type 2 diabetes mellitus with other diabetic neurological complication: Secondary | ICD-10-CM

## 2020-10-13 DIAGNOSIS — I10 Essential (primary) hypertension: Secondary | ICD-10-CM

## 2020-10-13 DIAGNOSIS — E1165 Type 2 diabetes mellitus with hyperglycemia: Secondary | ICD-10-CM

## 2020-10-13 LAB — COMPREHENSIVE METABOLIC PANEL
ALT: 21 U/L (ref 0–53)
AST: 15 U/L (ref 0–37)
Albumin: 4 g/dL (ref 3.5–5.2)
Alkaline Phosphatase: 56 U/L (ref 39–117)
BUN: 12 mg/dL (ref 6–23)
CO2: 23 mEq/L (ref 19–32)
Calcium: 8.9 mg/dL (ref 8.4–10.5)
Chloride: 100 mEq/L (ref 96–112)
Creatinine, Ser: 0.67 mg/dL (ref 0.40–1.50)
GFR: 111.76 mL/min (ref >60.00)
Glucose, Bld: 244 mg/dL — ABNORMAL HIGH (ref 70–99)
Potassium: 3.8 mEq/L (ref 3.5–5.1)
Sodium: 136 mEq/L (ref 135–145)
Total Bilirubin: 0.4 mg/dL (ref 0.2–1.2)
Total Protein: 7 g/dL (ref 6.0–8.3)

## 2020-10-13 LAB — HEMOGLOBIN A1C: Hgb A1c MFr Bld: 9.8 % — ABNORMAL HIGH (ref 4.6–6.5)

## 2020-10-13 MED ORDER — TRAZODONE HCL 100 MG PO TABS
200.0000 mg | ORAL_TABLET | Freq: Every day | ORAL | 0 refills | Status: DC
Start: 2020-10-13 — End: 2020-12-17

## 2020-10-13 MED ORDER — GABAPENTIN 300 MG PO CAPS
ORAL_CAPSULE | ORAL | 0 refills | Status: DC
Start: 2020-10-13 — End: 2020-11-09

## 2020-10-13 NOTE — Patient Instructions (Signed)
We will check the labs today. 

## 2020-10-13 NOTE — Assessment & Plan Note (Signed)
Reviewed [place] narcotic database with a few early fills by several days in the last 3-4 months. Due for UDS today. On concurrent alprazolam which he is aware is a risk. Adjust therapy as needed.

## 2020-10-13 NOTE — Progress Notes (Signed)
° °  Subjective:   Patient ID: John Rhodes, male    DOB: 11/19/1976, 44 y.o.   MRN: [id number]  HPI The patient is a 44 YO man coming in for follow up back pain (taking hydrocodone #150 per month for pain, having some worsening of the pain due to cold weather, tries to be active, is unable to work due to pain, not on disability) and diabetes (taking metformin and glipizide, previously moderate to severe exacerbation, he does have neuropathy and takes gabapentin which is still helping, no progression of neuropathy) and blood pressure (taking lisinopril/hctz, denies headaches or chest pains, denies missing doses of medication).   Review of Systems  Constitutional: Positive for activity change. Negative for appetite change, fatigue, fever and unexpected weight change.  HENT: Negative.   Eyes: Negative.   Respiratory: Negative.  Negative for cough, chest tightness and shortness of breath.   Cardiovascular: Negative.  Negative for chest pain, palpitations and leg swelling.  Gastrointestinal: Negative for abdominal distention, abdominal pain, constipation, diarrhea, nausea and vomiting.  Musculoskeletal: Positive for back pain and myalgias. Negative for arthralgias.  Skin: Negative.   Neurological: Negative.  Negative for syncope, weakness and numbness.  Psychiatric/Behavioral: Negative.     Objective:  Physical Exam Constitutional:      Appearance: He is well-developed.  HENT:     Head: Normocephalic and atraumatic.  Cardiovascular:     Rate and Rhythm: Normal rate and regular rhythm.  Pulmonary:     Effort: Pulmonary effort is normal. No respiratory distress.     Breath sounds: Normal breath sounds. No wheezing or rales.  Abdominal:     General: Bowel sounds are normal. There is no distension.     Palpations: Abdomen is soft.     Tenderness: There is no abdominal tenderness. There is no rebound.  Musculoskeletal:        General: Tenderness present.     Cervical back: Normal range of  motion.  Skin:    General: Skin is warm and dry.     Comments: Foot exam done  Neurological:     Mental Status: He is alert and oriented to person, place, and time.     Coordination: Coordination normal.     Vitals:   10/13/20 1019  BP: (!) 168/110  Pulse: 99  Temp: 98 F (36.7 C)  TempSrc: Oral  SpO2: 97%  Weight: 254 lb (115.2 kg)  Height: 5' 7"  (1.702 m)    This visit occurred during the SARS-CoV-2 public health emergency.  Safety protocols were in place, including screening questions prior to the visit, additional usage of staff PPE, and extensive cleaning of exam room while observing appropriate contact time as indicated for disinfecting solutions.   Assessment & Plan:

## 2020-10-13 NOTE — Assessment & Plan Note (Signed)
Taking lisinopril/hctz and BP elevated today. He does not feel it has been elevated recently. Will monitor closely but needs tight control given poorly controlled diabetes.

## 2020-10-13 NOTE — Telephone Encounter (Signed)
gabapentin (NEURONTIN) 300 MG capsule traZODone (DESYREL) 100 MG tablet [facility] [place] [place] [address] Phone:  [phone]  Fax:  [phone]     Was sent to the wrong pharmacy needs the medications sent to [facility].

## 2020-10-13 NOTE — Telephone Encounter (Signed)
Pt aware that meds have been refilled for only the prescriptions Dr Sharlet Salina ordered today.  Pt verb understanding.

## 2020-10-13 NOTE — Assessment & Plan Note (Signed)
Checking HgA1c, foot exam done. Will need to get better control of his diabetes. He has not been at goal for some time.

## 2020-10-15 LAB — DRUG MONITORING, PANEL 8 WITH CONFIRMATION, URINE
6 Acetylmorphine: NEGATIVE ng/mL (ref <10)
Alcohol Metabolites: NEGATIVE ng/mL
Alphahydroxyalprazolam: 42 ng/mL — ABNORMAL HIGH (ref <25)
Alphahydroxymidazolam: NEGATIVE ng/mL (ref <50)
Alphahydroxytriazolam: NEGATIVE ng/mL (ref <50)
Aminoclonazepam: NEGATIVE ng/mL (ref <25)
Amphetamines: NEGATIVE ng/mL (ref <500)
Benzodiazepines: POSITIVE ng/mL — AB (ref <100)
Buprenorphine, Urine: NEGATIVE ng/mL (ref <5)
Cocaine Metabolite: NEGATIVE ng/mL (ref <150)
Codeine: NEGATIVE ng/mL (ref <50)
Creatinine: 144.3 mg/dL
Hydrocodone: 5622 ng/mL — ABNORMAL HIGH (ref <50)
Hydromorphone: 1574 ng/mL — ABNORMAL HIGH (ref <50)
Hydroxyethylflurazepam: NEGATIVE ng/mL (ref <50)
Lorazepam: NEGATIVE ng/mL (ref <50)
MDMA: NEGATIVE ng/mL (ref <500)
Marijuana Metabolite: NEGATIVE ng/mL (ref <20)
Morphine: NEGATIVE ng/mL (ref <50)
Nordiazepam: NEGATIVE ng/mL (ref <50)
Norhydrocodone: 2968 ng/mL — ABNORMAL HIGH (ref <50)
Opiates: POSITIVE ng/mL — AB (ref <100)
Oxazepam: NEGATIVE ng/mL (ref <50)
Oxidant: NEGATIVE ug/mL
Oxycodone: NEGATIVE ng/mL (ref <100)
Temazepam: NEGATIVE ng/mL (ref <50)
pH: 5.4 (ref 4.5–9.0)

## 2020-10-15 LAB — DM TEMPLATE

## 2020-10-18 ENCOUNTER — Telehealth: Payer: Self-pay | Admitting: Internal Medicine

## 2020-10-18 NOTE — Telephone Encounter (Signed)
1.Medication Requested:HYDROcodone-acetaminophen (NORCO) 10-325 MG tablet  2. Pharmacy (Name, Street, [place]): [id number] [place] [address] Phone:  [phone] Fax:  [phone]      3. On Med List: Y  4. Last Visit with PCP:  11.24.21  5. Next visit date with PCP:  Not scheduled yet   Agent: Please be advised that RX refills may take up to 3 business days. We ask that you follow-up with your pharmacy.

## 2020-10-18 NOTE — Telephone Encounter (Signed)
   Patient calling for status of refill. No medication remaining Advised to allow more time

## 2020-10-19 MED ORDER — HYDROCODONE-ACETAMINOPHEN 10-325 MG PO TABS: 1.0000 | ORAL_TABLET | ORAL | 0 refills | Status: DC | PRN

## 2020-10-21 ENCOUNTER — Other Ambulatory Visit: Payer: Self-pay | Admitting: Internal Medicine

## 2020-10-21 DIAGNOSIS — E1149 Type 2 diabetes mellitus with other diabetic neurological complication: Secondary | ICD-10-CM

## 2020-10-21 DIAGNOSIS — IMO0002 Reserved for concepts with insufficient information to code with codable children: Secondary | ICD-10-CM

## 2020-11-01 ENCOUNTER — Other Ambulatory Visit: Payer: Self-pay | Admitting: Internal Medicine

## 2020-11-02 ENCOUNTER — Other Ambulatory Visit: Payer: Self-pay | Admitting: Internal Medicine

## 2020-11-04 ENCOUNTER — Other Ambulatory Visit: Payer: Self-pay | Admitting: Internal Medicine

## 2020-11-07 ENCOUNTER — Other Ambulatory Visit: Payer: Self-pay | Admitting: Internal Medicine

## 2020-11-08 NOTE — Telephone Encounter (Signed)
Refill request for:  Gabapentin 300 mg LR 10/13/20, #120, o rf LOV 10/13/20  FOV  None scheduled.

## 2020-11-09 ENCOUNTER — Other Ambulatory Visit: Payer: Self-pay | Admitting: Internal Medicine

## 2020-11-10 NOTE — Telephone Encounter (Signed)
Refill request for Alprazolam 0.5 mg tabs.   Last OV 10/13/20 Next OV not scheduled  Last refilled 06/23/20

## 2020-11-15 ENCOUNTER — Telehealth: Payer: Self-pay | Admitting: Internal Medicine

## 2020-11-15 NOTE — Telephone Encounter (Signed)
1.Medication Requested: HYDROcodone-acetaminophen (NORCO) 10-325 MG tablet 2. Pharmacy (Name, Street, [place]): [id number] [place] [address] Phone:  [phone] Fax:  [phone]    3. On Med List: yes 4. Last Visit with PCP: 11.24.21 5. Next visit date with PCP: n/a

## 2020-11-17 MED ORDER — HYDROCODONE-ACETAMINOPHEN 10-325 MG PO TABS
1.0000 | ORAL_TABLET | ORAL | 0 refills | Status: DC | PRN
Start: 2020-11-17 — End: 2020-12-16

## 2020-11-22 ENCOUNTER — Encounter: Payer: Self-pay | Admitting: Endocrinology

## 2020-12-15 ENCOUNTER — Telehealth: Payer: Self-pay | Admitting: Internal Medicine

## 2020-12-15 NOTE — Telephone Encounter (Signed)
See below

## 2020-12-15 NOTE — Telephone Encounter (Signed)
1.Medication Requested: HYDROcodone-acetaminophen (NORCO) 10-325 MG tablet    2. Pharmacy (Name, Street, [place]): [id number] [place] [address]  3. On Med List: yes   4. Last Visit with PCP: 11.24.21  5. Next visit date with PCP: 2.25.22   Agent: Please be advised that RX refills may take up to 3 business days. We ask that you follow-up with your pharmacy.

## 2020-12-16 ENCOUNTER — Other Ambulatory Visit: Payer: Self-pay | Admitting: Internal Medicine

## 2020-12-16 MED ORDER — HYDROCODONE-ACETAMINOPHEN 10-325 MG PO TABS: 1.0000 | ORAL_TABLET | ORAL | 0 refills | Status: DC | PRN

## 2020-12-16 NOTE — Telephone Encounter (Signed)
Medication has been approved and sent to the patient's pharmacy.

## 2021-01-14 ENCOUNTER — Encounter: Payer: Self-pay | Admitting: Internal Medicine

## 2021-01-14 ENCOUNTER — Ambulatory Visit (INDEPENDENT_AMBULATORY_CARE_PROVIDER_SITE_OTHER): Payer: Self-pay | Admitting: Internal Medicine

## 2021-01-14 ENCOUNTER — Other Ambulatory Visit: Payer: Self-pay

## 2021-01-14 VITALS — BP 126/74 | HR 110 | Temp 98.5°F | Resp 18 | Ht 67.0 in | Wt 254.2 lb

## 2021-01-14 DIAGNOSIS — IMO0002 Reserved for concepts with insufficient information to code with codable children: Secondary | ICD-10-CM

## 2021-01-14 DIAGNOSIS — E1149 Type 2 diabetes mellitus with other diabetic neurological complication: Secondary | ICD-10-CM

## 2021-01-14 DIAGNOSIS — E1165 Type 2 diabetes mellitus with hyperglycemia: Secondary | ICD-10-CM

## 2021-01-14 DIAGNOSIS — F1120 Opioid dependence, uncomplicated: Secondary | ICD-10-CM

## 2021-01-14 LAB — HEMOGLOBIN A1C: Hgb A1c MFr Bld: 9.8 % — ABNORMAL HIGH (ref 4.6–6.5)

## 2021-01-14 MED ORDER — HYDROCODONE-ACETAMINOPHEN 10-325 MG PO TABS: 1.0000 | ORAL_TABLET | ORAL | 0 refills | Status: DC | PRN

## 2021-01-14 MED ORDER — GLIPIZIDE 5 MG PO TABS
5.0000 mg | ORAL_TABLET | Freq: Two times a day (BID) | ORAL | 3 refills | Status: DC
Start: 2021-01-14 — End: 2021-02-18

## 2021-01-14 MED ORDER — METFORMIN HCL 1000 MG PO TABS
1000.0000 mg | ORAL_TABLET | Freq: Two times a day (BID) | ORAL | 3 refills | Status: DC
Start: 2021-01-14 — End: 2021-08-24

## 2021-01-14 NOTE — Assessment & Plan Note (Signed)
Needs HgA1c and refilled metformin and glipizide. He was unable to go see endo due to lack of insurance.

## 2021-01-14 NOTE — Assessment & Plan Note (Signed)
Refilled hydrocodone and encouraged him to continue working on his social situation so he is able to explore other treatment for his back. [place] narcotic database reviewed and no inappropriate fills. Last UDS appropriate 09/2020.

## 2021-01-14 NOTE — Patient Instructions (Signed)
We have done the refills.

## 2021-01-14 NOTE — Progress Notes (Signed)
   Subjective:   Patient ID: John Rhodes, male    DOB: [birth year], 45 y.o.   MRN: [id number]  HPI The patient is a 45 YO man coming in for chronic pain management. He has had a fairly good winter without increased pain. He is looking forward to spring. He is still trying to get disability and is filling out the forms next month with the social security department. He struggles with his health as he does not have insurance and cannot afford to go to any specialists to manage his health.   Review of Systems  Constitutional: Negative.   HENT: Negative.   Eyes: Negative.   Respiratory: Negative for cough, chest tightness and shortness of breath.   Cardiovascular: Negative for chest pain, palpitations and leg swelling.  Gastrointestinal: Negative for abdominal distention, abdominal pain, constipation, diarrhea, nausea and vomiting.  Musculoskeletal: Positive for back pain.  Skin: Negative.   Neurological: Negative.   Psychiatric/Behavioral: Negative.     Objective:  Physical Exam Constitutional:      Appearance: He is well-developed and well-nourished.  HENT:     Head: Normocephalic and atraumatic.  Eyes:     Extraocular Movements: EOM normal.  Cardiovascular:     Rate and Rhythm: Normal rate and regular rhythm.  Pulmonary:     Effort: Pulmonary effort is normal. No respiratory distress.     Breath sounds: Normal breath sounds. No wheezing or rales.  Abdominal:     General: Bowel sounds are normal. There is no distension.     Palpations: Abdomen is soft.     Tenderness: There is no abdominal tenderness. There is no rebound.  Musculoskeletal:        General: No edema.     Cervical back: Normal range of motion.  Skin:    General: Skin is warm and dry.  Neurological:     Mental Status: He is alert and oriented to person, place, and time.     Coordination: Coordination normal.  Psychiatric:        Mood and Affect: Mood and affect normal.     Vitals:   01/14/21 0900  BP:  126/74  Pulse: (!) 110  Resp: 18  Temp: 98.5 F (36.9 C)  TempSrc: Oral  SpO2: 96%  Weight: 254 lb 3.2 oz (115.3 kg)  Height: 5' 7"  (1.702 m)    This visit occurred during the SARS-CoV-2 public health emergency.  Safety protocols were in place, including screening questions prior to the visit, additional usage of staff PPE, and extensive cleaning of exam room while observing appropriate contact time as indicated for disinfecting solutions.   Assessment & Plan:

## 2021-01-20 ENCOUNTER — Other Ambulatory Visit: Payer: Self-pay | Admitting: Internal Medicine

## 2021-01-20 MED ORDER — PIOGLITAZONE HCL 30 MG PO TABS: 30.0000 mg | ORAL_TABLET | Freq: Every day | ORAL | 3 refills | Status: DC

## 2021-02-09 ENCOUNTER — Other Ambulatory Visit: Payer: Self-pay | Admitting: Internal Medicine

## 2021-02-09 ENCOUNTER — Telehealth: Payer: Self-pay | Admitting: Internal Medicine

## 2021-02-09 MED ORDER — HYDROCODONE-ACETAMINOPHEN 10-325 MG PO TABS: 1.0000 | ORAL_TABLET | ORAL | 0 refills | Status: DC | PRN

## 2021-02-09 NOTE — Telephone Encounter (Signed)
1.Medication Requested: HYDROcodone-acetaminophen (NORCO) 10-325 MG tablet    2. Pharmacy (Name, Street, [place]): [id number] [place]  3. On Med List: yes   4. Last Visit with PCP: 01/14/21  5. Next visit date with PCP: n/a    Agent: Please be advised that RX refills may take up to 3 business days. We ask that you follow-up with your pharmacy.

## 2021-02-09 NOTE — Telephone Encounter (Signed)
See below

## 2021-02-09 NOTE — Addendum Note (Signed)
Addended by: Pricilla Holm A on: 02/09/2021 11:32 AM   Modules accepted: Orders

## 2021-02-17 ENCOUNTER — Other Ambulatory Visit: Payer: Self-pay | Admitting: Internal Medicine

## 2021-03-07 ENCOUNTER — Other Ambulatory Visit: Payer: Self-pay | Admitting: Internal Medicine

## 2021-03-09 ENCOUNTER — Telehealth: Payer: Self-pay | Admitting: Internal Medicine

## 2021-03-09 NOTE — Telephone Encounter (Signed)
See below

## 2021-03-09 NOTE — Telephone Encounter (Signed)
1.Medication Requested: HYDROcodone-acetaminophen (NORCO) 10-325 MG tablet    2. Pharmacy (Name, Street, [place]): [id number] [place] [address]  3. On Med List: yes   4. Last Visit with PCP: 01/14/21  5. Next visit date with PCP: n/a   Patient states he does not need it until Sunday but wanted to go ahead and call it in so we can get it in before the weekend

## 2021-03-11 MED ORDER — HYDROCODONE-ACETAMINOPHEN 10-325 MG PO TABS: 1.0000 | ORAL_TABLET | ORAL | 0 refills | Status: DC | PRN

## 2021-04-08 ENCOUNTER — Encounter: Payer: Self-pay | Admitting: Internal Medicine

## 2021-04-08 ENCOUNTER — Other Ambulatory Visit: Payer: Self-pay

## 2021-04-08 ENCOUNTER — Ambulatory Visit (INDEPENDENT_AMBULATORY_CARE_PROVIDER_SITE_OTHER): Payer: Self-pay | Admitting: Internal Medicine

## 2021-04-08 VITALS — BP 136/86 | HR 97 | Temp 98.9°F | Resp 18 | Ht 67.0 in | Wt 254.2 lb

## 2021-04-08 DIAGNOSIS — IMO0002 Reserved for concepts with insufficient information to code with codable children: Secondary | ICD-10-CM

## 2021-04-08 DIAGNOSIS — E1149 Type 2 diabetes mellitus with other diabetic neurological complication: Secondary | ICD-10-CM

## 2021-04-08 DIAGNOSIS — F1120 Opioid dependence, uncomplicated: Secondary | ICD-10-CM

## 2021-04-08 DIAGNOSIS — E1165 Type 2 diabetes mellitus with hyperglycemia: Secondary | ICD-10-CM

## 2021-04-08 LAB — POCT GLYCOSYLATED HEMOGLOBIN (HGB A1C): Hemoglobin A1C: 8.4 % — AB (ref 4.0–5.6)

## 2021-04-08 MED ORDER — HYDROCODONE-ACETAMINOPHEN 10-325 MG PO TABS: 1.0000 | ORAL_TABLET | ORAL | 0 refills | Status: DC | PRN

## 2021-04-08 NOTE — Assessment & Plan Note (Signed)
POC HgA1c improved but still uncontrolled at 8.4. He would like to keep working on diet without insurance care is limited by cost. He will keep taking actos, metformin and glipizide.

## 2021-04-08 NOTE — Assessment & Plan Note (Signed)
Recent UDS appropriate. [place] narcotic database reviewed and appropriate. Refilled hydrocodone and he is still seeking disability.

## 2021-04-08 NOTE — Progress Notes (Signed)
   Subjective:   Patient ID: John Rhodes, male    DOB: [birth year], 45 y.o.   MRN: [id number]  HPI The patient is a 45 YO man coming in for follow up pain management and diabetes. Trying to work on diet for diabetes. Still chronic pain and stable overall. Still working on disability.  Review of Systems  Constitutional: Positive for activity change. Negative for appetite change, fatigue, fever and unexpected weight change.  Respiratory: Negative.   Cardiovascular: Negative.   Musculoskeletal: Positive for back pain and myalgias. Negative for arthralgias.  Skin: Negative.   Neurological: Negative for syncope, weakness and numbness.    Objective:  Physical Exam Constitutional:      Appearance: He is well-developed. He is obese.  HENT:     Head: Normocephalic and atraumatic.  Cardiovascular:     Rate and Rhythm: Normal rate and regular rhythm.  Pulmonary:     Effort: Pulmonary effort is normal. No respiratory distress.     Breath sounds: Normal breath sounds. No wheezing or rales.  Abdominal:     General: Bowel sounds are normal. There is no distension.     Palpations: Abdomen is soft.     Tenderness: There is no abdominal tenderness. There is no rebound.  Musculoskeletal:        General: Tenderness present.     Cervical back: Normal range of motion.  Skin:    General: Skin is warm and dry.  Neurological:     Mental Status: He is alert and oriented to person, place, and time.     Coordination: Coordination normal.     Vitals:   04/08/21 1043  BP: 136/86  Pulse: 97  Resp: 18  Temp: 98.9 F (37.2 C)  TempSrc: Oral  SpO2: 96%  Weight: 254 lb 3.2 oz (115.3 kg)  Height: 5' 7"  (1.702 m)    This visit occurred during the SARS-CoV-2 public health emergency.  Safety protocols were in place, including screening questions prior to the visit, additional usage of staff PPE, and extensive cleaning of exam room while observing appropriate contact time as indicated for  disinfecting solutions.   Assessment & Plan:

## 2021-04-08 NOTE — Patient Instructions (Addendum)
The sugars are doing better and the HgA1c is 8.4.  We will leave things the same for now.

## 2021-04-13 ENCOUNTER — Ambulatory Visit: Payer: Self-pay | Admitting: Internal Medicine

## 2021-04-19 ENCOUNTER — Other Ambulatory Visit: Payer: Self-pay | Admitting: Internal Medicine

## 2021-04-20 ENCOUNTER — Other Ambulatory Visit: Payer: Self-pay | Admitting: Internal Medicine

## 2021-05-05 ENCOUNTER — Telehealth: Payer: Self-pay | Admitting: Internal Medicine

## 2021-05-05 NOTE — Telephone Encounter (Signed)
1.Medication Requested: HYDROcodone-acetaminophen (NORCO) 10-325 MG tablet   2. Pharmacy (Name, Street, [place]): [id number] [place] [address]   3. On Med List: yes   4. Last Visit with PCP: 04-08-21  5. Next visit date with PCP: n/a    Agent: Please be advised that RX refills may take up to 3 business days. We ask that you follow-up with your pharmacy.

## 2021-05-06 ENCOUNTER — Other Ambulatory Visit: Payer: Self-pay | Admitting: Internal Medicine

## 2021-05-09 MED ORDER — HYDROCODONE-ACETAMINOPHEN 10-325 MG PO TABS: 1.0000 | ORAL_TABLET | ORAL | 0 refills | Status: DC | PRN

## 2021-05-09 NOTE — Telephone Encounter (Signed)
Refill done.  

## 2021-05-09 NOTE — Telephone Encounter (Signed)
Patient calling for status of refill  

## 2021-06-06 ENCOUNTER — Telehealth: Payer: Self-pay | Admitting: Internal Medicine

## 2021-06-06 NOTE — Telephone Encounter (Signed)
1.Medication Requested:  HYDROcodone-acetaminophen (NORCO) 10-325 MG tablet  2. Pharmacy (Name, Street, [place]):  [id number] [place] [place] Phone:  [phone] Fax:  [phone]   85   3. On Med List: Y  4. Last Visit with PCP:  5.20.22  5. Next visit date with PCP:  8.12.22   Agent: Please be advised that RX refills may take up to 3 business days. We ask that you follow-up with your pharmacy.

## 2021-06-07 MED ORDER — HYDROCODONE-ACETAMINOPHEN 10-325 MG PO TABS: 1.0000 | ORAL_TABLET | ORAL | 0 refills | Status: DC | PRN

## 2021-06-07 NOTE — Addendum Note (Signed)
Addended by: Biagio Borg on: 06/07/2021 04:45 PM   Modules accepted: Orders

## 2021-06-07 NOTE — Telephone Encounter (Signed)
Ok done erx

## 2021-06-07 NOTE — Telephone Encounter (Signed)
Last RF per controlled substance database: 05/09/21.   Offered pt to wait until MD return to office & they decline.  Aware that I will have to send to another provider for review.

## 2021-06-08 NOTE — Telephone Encounter (Signed)
LVM informing pt that requested prescription has been refilled to pharmacy requested.

## 2021-06-27 ENCOUNTER — Other Ambulatory Visit: Payer: Self-pay | Admitting: Internal Medicine

## 2021-07-01 ENCOUNTER — Other Ambulatory Visit: Payer: Self-pay

## 2021-07-01 ENCOUNTER — Encounter: Payer: Self-pay | Admitting: Internal Medicine

## 2021-07-01 ENCOUNTER — Ambulatory Visit (INDEPENDENT_AMBULATORY_CARE_PROVIDER_SITE_OTHER): Payer: 59 | Admitting: Internal Medicine

## 2021-07-01 VITALS — BP 126/76 | HR 100 | Temp 98.3°F | Resp 18 | Ht 67.0 in | Wt 255.2 lb

## 2021-07-01 DIAGNOSIS — F1120 Opioid dependence, uncomplicated: Secondary | ICD-10-CM | POA: Diagnosis not present

## 2021-07-01 DIAGNOSIS — G8929 Other chronic pain: Secondary | ICD-10-CM

## 2021-07-01 DIAGNOSIS — E1165 Type 2 diabetes mellitus with hyperglycemia: Secondary | ICD-10-CM

## 2021-07-01 DIAGNOSIS — M549 Dorsalgia, unspecified: Secondary | ICD-10-CM | POA: Diagnosis not present

## 2021-07-01 DIAGNOSIS — E1149 Type 2 diabetes mellitus with other diabetic neurological complication: Secondary | ICD-10-CM | POA: Diagnosis not present

## 2021-07-01 DIAGNOSIS — Z1211 Encounter for screening for malignant neoplasm of colon: Secondary | ICD-10-CM

## 2021-07-01 DIAGNOSIS — IMO0002 Reserved for concepts with insufficient information to code with codable children: Secondary | ICD-10-CM

## 2021-07-01 LAB — COMPREHENSIVE METABOLIC PANEL
ALT: 26 U/L (ref 0–53)
AST: 21 U/L (ref 0–37)
Albumin: 3.9 g/dL (ref 3.5–5.2)
Alkaline Phosphatase: 60 U/L (ref 39–117)
BUN: 15 mg/dL (ref 6–23)
CO2: 24 mEq/L (ref 19–32)
Calcium: 8.8 mg/dL (ref 8.4–10.5)
Chloride: 101 mEq/L (ref 96–112)
Creatinine, Ser: 0.74 mg/dL (ref 0.40–1.50)
GFR: 107.92 mL/min (ref >60.00)
Glucose, Bld: 176 mg/dL — ABNORMAL HIGH (ref 70–99)
Potassium: 3.6 mEq/L (ref 3.5–5.1)
Sodium: 137 mEq/L (ref 135–145)
Total Bilirubin: 0.3 mg/dL (ref 0.2–1.2)
Total Protein: 7 g/dL (ref 6.0–8.3)

## 2021-07-01 LAB — LIPID PANEL
Cholesterol: 193 mg/dL (ref 0–200)
HDL: 33.9 mg/dL — ABNORMAL LOW (ref >39.00)
Total CHOL/HDL Ratio: 6
Triglycerides: 752 mg/dL — ABNORMAL HIGH (ref 0.0–149.0)

## 2021-07-01 LAB — CBC
HCT: 43.6 % (ref 39.0–52.0)
Hemoglobin: 14.8 g/dL (ref 13.0–17.0)
MCHC: 34 g/dL (ref 30.0–36.0)
MCV: 89.4 fl (ref 78.0–100.0)
Platelets: 188 10*3/uL (ref 150.0–400.0)
RBC: 4.87 Mil/uL (ref 4.22–5.81)
RDW: 13.1 % (ref 11.5–15.5)
WBC: 9.3 10*3/uL (ref 4.0–10.5)

## 2021-07-01 LAB — LDL CHOLESTEROL, DIRECT: Direct LDL: 78 mg/dL

## 2021-07-01 LAB — HEMOGLOBIN A1C: Hgb A1c MFr Bld: 9.4 % — ABNORMAL HIGH (ref 4.6–6.5)

## 2021-07-01 NOTE — Assessment & Plan Note (Signed)
Refill hydrocodone when due. Asked if he wanted to update imaging for back and he will think about this.

## 2021-07-01 NOTE — Assessment & Plan Note (Signed)
Checking HgA1c, lipid panel and CMP. Adjust metformin and glipizide and actos as needed. Now that he has insurance depending on coverage we may be able to start more effective regimen. Poorly controlled in the past.

## 2021-07-01 NOTE — Progress Notes (Signed)
   Subjective:   Patient ID: John Rhodes, male    DOB: 1976/05/20, 45 y.o.   MRN: [id number]  HPI The patient is a 45 YO man coming in for follow up chronic pain. Has insurance and wants to get things done for the health while he has this.  Review of Systems  Constitutional: Negative.   HENT: Negative.    Eyes: Negative.   Respiratory:  Negative for cough, chest tightness and shortness of breath.   Cardiovascular:  Negative for chest pain, palpitations and leg swelling.  Gastrointestinal:  Negative for abdominal distention, abdominal pain, constipation, diarrhea, nausea and vomiting.  Musculoskeletal:  Positive for arthralgias and myalgias.  Skin: Negative.   Neurological:  Positive for numbness.  Psychiatric/Behavioral: Negative.     Objective:  Physical Exam Constitutional:      Appearance: He is well-developed. He is obese.  HENT:     Head: Normocephalic and atraumatic.  Cardiovascular:     Rate and Rhythm: Normal rate and regular rhythm.  Pulmonary:     Effort: Pulmonary effort is normal. No respiratory distress.     Breath sounds: Normal breath sounds. No wheezing or rales.  Abdominal:     General: Bowel sounds are normal. There is no distension.     Palpations: Abdomen is soft.     Tenderness: There is no abdominal tenderness. There is no rebound.  Musculoskeletal:        General: Tenderness present.     Cervical back: Normal range of motion.  Skin:    General: Skin is warm and dry.  Neurological:     Mental Status: He is alert and oriented to person, place, and time.     Coordination: Coordination normal.    Vitals:   07/01/21 1031  BP: 126/76  Pulse: 100  Resp: 18  Temp: 98.3 F (36.8 C)  TempSrc: Oral  SpO2: 96%  Weight: 255 lb 3.2 oz (115.8 kg)  Height: 5' 7"  (1.702 m)    This visit occurred during the SARS-CoV-2 public health emergency.  Safety protocols were in place, including screening questions prior to the visit, additional usage of staff  PPE, and extensive cleaning of exam room while observing appropriate contact time as indicated for disinfecting solutions.   Assessment & Plan:

## 2021-07-01 NOTE — Patient Instructions (Signed)
We will get the cologuard sent out to the house so do this and send it back in.  We are checking the labs today.

## 2021-07-01 NOTE — Assessment & Plan Note (Signed)
[place] narcotic database reviewed. Pain contract on file. Recent UDS appropriate. Refill hydrocodone when due #150 per month.

## 2021-07-03 ENCOUNTER — Other Ambulatory Visit: Payer: Self-pay | Admitting: Internal Medicine

## 2021-07-04 ENCOUNTER — Other Ambulatory Visit: Payer: Self-pay | Admitting: Internal Medicine

## 2021-07-05 ENCOUNTER — Other Ambulatory Visit: Payer: Self-pay | Admitting: Internal Medicine

## 2021-07-05 MED ORDER — HYDROCODONE-ACETAMINOPHEN 10-325 MG PO TABS: 1.0000 | ORAL_TABLET | ORAL | 0 refills | Status: DC | PRN

## 2021-07-05 NOTE — Telephone Encounter (Signed)
error 

## 2021-07-13 ENCOUNTER — Other Ambulatory Visit: Payer: Self-pay | Admitting: Internal Medicine

## 2021-07-13 MED ORDER — LANTUS SOLOSTAR 100 UNIT/ML ~~LOC~~ SOPN: 10.0000 [IU] | PEN_INJECTOR | Freq: Every day | SUBCUTANEOUS | 6 refills | Status: DC

## 2021-07-13 MED ORDER — SIMVASTATIN 40 MG PO TABS: 40.0000 mg | ORAL_TABLET | Freq: Every day | ORAL | 3 refills | Status: DC

## 2021-07-19 ENCOUNTER — Other Ambulatory Visit: Payer: Self-pay | Admitting: Internal Medicine

## 2021-07-21 ENCOUNTER — Other Ambulatory Visit: Payer: Self-pay | Admitting: Internal Medicine

## 2021-07-22 ENCOUNTER — Other Ambulatory Visit: Payer: Self-pay | Admitting: Internal Medicine

## 2021-07-27 ENCOUNTER — Telehealth: Payer: Self-pay

## 2021-07-27 NOTE — Telephone Encounter (Signed)
See below

## 2021-07-27 NOTE — Telephone Encounter (Signed)
Please advise as the pt has stated he needs a refill for his traZODone (DESYREL) 100 MG tablet  as he has stated when he was last here on 07/01/2021 that you guys discussed him taking 2 pills instead of the one. Pt has stated due to him taking the 2 pills it made him run out quicker. He is asking that the rx be changed from 1 pill a day to 2 pills a day.

## 2021-07-28 MED ORDER — TRAZODONE HCL 100 MG PO TABS: 200.0000 mg | ORAL_TABLET | Freq: Every day | ORAL | 0 refills | Status: DC

## 2021-07-28 NOTE — Telephone Encounter (Signed)
Medication has been sent to the pharmacy. 

## 2021-07-28 NOTE — Addendum Note (Signed)
Addended by: Thomes Cake on: 07/28/2021 02:34 PM   Modules accepted: Orders

## 2021-07-28 NOTE — Telephone Encounter (Signed)
Okay to refill, this rx is already written for 2 pills at night time.

## 2021-08-03 ENCOUNTER — Telehealth: Payer: Self-pay | Admitting: Internal Medicine

## 2021-08-03 MED ORDER — HYDROCODONE-ACETAMINOPHEN 10-325 MG PO TABS: 1.0000 | ORAL_TABLET | ORAL | 0 refills | Status: DC | PRN

## 2021-08-03 NOTE — Telephone Encounter (Signed)
1.Medication Requested: HYDROcodone-acetaminophen (NORCO) 10-325 MG tablet  2. Pharmacy (Name, Street, [place]): [id number] [place]  Phone:  [phone]:  [phone]  3. On Med List: yes  4. Last Visit with PCP: 08.12.22  5. Next visit date with PCP: n/a   Agent: Please be advised that RX refills may take up to 3 business days. We ask that you follow-up with your pharmacy.

## 2021-08-03 NOTE — Telephone Encounter (Signed)
See below

## 2021-08-08 ENCOUNTER — Other Ambulatory Visit: Payer: Self-pay | Admitting: Internal Medicine

## 2021-08-08 ENCOUNTER — Other Ambulatory Visit: Payer: Self-pay

## 2021-08-08 NOTE — Telephone Encounter (Signed)
Please advise as [facility] has called asking for rx be submitted for pts   gabapentin (NEURONTIN) 300 MG capsule

## 2021-08-23 ENCOUNTER — Other Ambulatory Visit: Payer: Self-pay | Admitting: Internal Medicine

## 2021-08-24 ENCOUNTER — Other Ambulatory Visit: Payer: Self-pay | Admitting: Internal Medicine

## 2021-09-01 ENCOUNTER — Telehealth: Payer: Self-pay | Admitting: Internal Medicine

## 2021-09-01 NOTE — Telephone Encounter (Signed)
See below

## 2021-09-01 NOTE — Telephone Encounter (Signed)
1.Medication Requested: HYDROcodone-acetaminophen (NORCO) 10-325 MG tablet 2. Pharmacy (Name, Street, [place]): [id number] [place] [place] Phone:  [phone] Fax:  [phone]    3. On Med List: Y  4. Last Visit with PCP: 10/04/2021  5. Next visit date with PCP: 07/01/2021   Agent: Please be advised that RX refills may take up to 3 business days. We ask that you follow-up with your pharmacy.

## 2021-09-02 MED ORDER — HYDROCODONE-ACETAMINOPHEN 10-325 MG PO TABS: 1.0000 | ORAL_TABLET | ORAL | 0 refills | Status: DC | PRN

## 2021-09-23 ENCOUNTER — Other Ambulatory Visit: Payer: Self-pay | Admitting: Internal Medicine

## 2021-10-04 ENCOUNTER — Ambulatory Visit (INDEPENDENT_AMBULATORY_CARE_PROVIDER_SITE_OTHER): Payer: 59 | Admitting: Internal Medicine

## 2021-10-04 ENCOUNTER — Telehealth: Payer: Self-pay | Admitting: Internal Medicine

## 2021-10-04 ENCOUNTER — Other Ambulatory Visit: Payer: Self-pay

## 2021-10-04 ENCOUNTER — Encounter: Payer: Self-pay | Admitting: Internal Medicine

## 2021-10-04 DIAGNOSIS — E118 Type 2 diabetes mellitus with unspecified complications: Secondary | ICD-10-CM | POA: Diagnosis not present

## 2021-10-04 DIAGNOSIS — M549 Dorsalgia, unspecified: Secondary | ICD-10-CM | POA: Diagnosis not present

## 2021-10-04 DIAGNOSIS — G8929 Other chronic pain: Secondary | ICD-10-CM

## 2021-10-04 DIAGNOSIS — E1165 Type 2 diabetes mellitus with hyperglycemia: Secondary | ICD-10-CM

## 2021-10-04 MED ORDER — HYDROCODONE-ACETAMINOPHEN 10-325 MG PO TABS: 1.0000 | ORAL_TABLET | ORAL | 0 refills | Status: DC | PRN

## 2021-10-04 MED ORDER — INSULIN GLARGINE 100 UNIT/ML ~~LOC~~ SOLN: SUBCUTANEOUS | 11 refills | Status: DC

## 2021-10-04 MED ORDER — BLOOD GLUCOSE METER KIT: PACK | 0 refills | Status: AC

## 2021-10-04 NOTE — Patient Instructions (Signed)
We have sent in the lantus insulin to start taking 10 units daily. Every 2 days if the sugar is still >100 in the morning increase by 2 units up to maximum of 40 units.

## 2021-10-04 NOTE — Telephone Encounter (Signed)
Patient states he did not receive a rx for the lantus pin needles for the insulin glargine (LANTUS) 100 UNIT/ML injection  [id number] [place]

## 2021-10-04 NOTE — Progress Notes (Signed)
   Subjective:   Patient ID: John Rhodes, male    DOB: [birth year], 45 y.o.   MRN: [id number]  HPI The patient is a 45 YO man coming in for follow up.   Review of Systems  Constitutional: Negative.   HENT: Negative.    Eyes: Negative.   Respiratory:  Negative for cough, chest tightness and shortness of breath.   Cardiovascular:  Negative for chest pain, palpitations and leg swelling.  Gastrointestinal:  Negative for abdominal distention, abdominal pain, constipation, diarrhea, nausea and vomiting.  Musculoskeletal:  Positive for arthralgias, back pain, joint swelling and neck pain.  Skin: Negative.   Neurological: Negative.   Psychiatric/Behavioral: Negative.     Objective:  Physical Exam Constitutional:      Appearance: He is well-developed.  HENT:     Head: Normocephalic and atraumatic.  Cardiovascular:     Rate and Rhythm: Normal rate and regular rhythm.  Pulmonary:     Effort: Pulmonary effort is normal. No respiratory distress.     Breath sounds: Normal breath sounds. No wheezing or rales.  Abdominal:     General: Bowel sounds are normal. There is no distension.     Palpations: Abdomen is soft.     Tenderness: There is no abdominal tenderness. There is no rebound.  Musculoskeletal:        General: Tenderness and signs of injury present.     Cervical back: Normal range of motion.     Comments: Right arm in sling and recent fall more pain and decreased ROM  Skin:    General: Skin is warm and dry.  Neurological:     Mental Status: He is alert and oriented to person, place, and time.     Coordination: Coordination normal.    Vitals:   10/04/21 1416  BP: 126/78  Pulse: (!) 102  Resp: 18  SpO2: 97%  Weight: 254 lb 9.6 oz (115.5 kg)  Height: 5' 7"  (1.702 m)    This visit occurred during the SARS-CoV-2 public health emergency.  Safety protocols were in place, including screening questions prior to the visit, additional usage of staff PPE, and extensive cleaning  of exam room while observing appropriate contact time as indicated for disinfecting solutions.   Assessment & Plan:

## 2021-10-04 NOTE — Telephone Encounter (Signed)
See below

## 2021-10-05 MED ORDER — "SYRINGE 27G X 1-1/4"" 3 ML MISC": 3 refills | Status: DC

## 2021-10-05 NOTE — Telephone Encounter (Signed)
Sent in syringes and needles

## 2021-10-08 NOTE — Assessment & Plan Note (Signed)
Moderate to severe exacerbation and he has not been taking his medications regularly. Is willing to do lantus vials as the pen was not covered and he did not start. New rx and instructions given to start with 10 units daily lantus and then monitor morning sugar and increase 2 units every 2 days until morning sugar consistently <150 and stop at 40 units and let us know if still not at goal. Will continue glipizide and metformin as well and actos.

## 2021-10-08 NOTE — Assessment & Plan Note (Signed)
Due for refill hydrocodone which is done. [place] narcotic database reviewed and appropriate. Contract on file and last UDS appropriate.

## 2021-10-17 ENCOUNTER — Other Ambulatory Visit: Payer: Self-pay | Admitting: Internal Medicine

## 2021-11-01 ENCOUNTER — Telehealth: Payer: Self-pay | Admitting: Internal Medicine

## 2021-11-01 MED ORDER — HYDROCODONE-ACETAMINOPHEN 10-325 MG PO TABS: 1.0000 | ORAL_TABLET | ORAL | 0 refills | Status: DC | PRN

## 2021-11-01 NOTE — Telephone Encounter (Signed)
See below

## 2021-11-01 NOTE — Telephone Encounter (Signed)
1.Medication Requested: HYDROcodone-acetaminophen (NORCO) 10-325 MG tablet 2. Pharmacy (Name, [place]): [id number] - [place], [place] Phone:  [phone]  Fax:  [phone]     3. On Med List: y  32. Last Visit with PCP: 11.15.2022  5. Next visit date with PCP: n/a   Agent: Please be advised that RX refills may take up to 3 business days. We ask that you follow-up with your pharmacy.

## 2021-11-03 MED ORDER — HYDROCODONE-ACETAMINOPHEN 10-325 MG PO TABS: 1.0000 | ORAL_TABLET | ORAL | 0 refills | Status: DC | PRN

## 2021-11-03 NOTE — Addendum Note (Signed)
Addended by: Pricilla Holm A on: 11/03/2021 12:10 PM   Modules accepted: Orders

## 2021-11-03 NOTE — Telephone Encounter (Signed)
Spoke with the patient and he was told by the pharmacist they were out of the medication and so were other CVS's in a 30 mile radius. Patient stated that he was willing to sign another controlled substance contact if needed. Told the patient I would call him back with a update on the next steps.   Spoke with Cristie Hem at [facility] and he was able to tell me that the medication was out of stock. He then checked to see if any other CVS's have it in stock. No other CVS's have it in stock within a 20 mile radius.

## 2021-11-03 NOTE — Telephone Encounter (Signed)
Sent in to [facility] thanks for update. [facility].

## 2021-11-03 NOTE — Telephone Encounter (Signed)
Pt [facility]. Please send to the location put In message.

## 2021-11-03 NOTE — Telephone Encounter (Signed)
See below

## 2021-11-03 NOTE — Telephone Encounter (Signed)
Pt aware that prescription has been sent to [facility] for pick up. He was very appreciative for the call back.   [facility].

## 2021-11-03 NOTE — Telephone Encounter (Signed)
Since he is on controlled substance contract we will only send his controlled substances to 1 pharmacy. [facility] and should fill there.

## 2021-11-17 ENCOUNTER — Other Ambulatory Visit: Payer: Self-pay | Admitting: Internal Medicine

## 2021-11-30 ENCOUNTER — Telehealth: Payer: Self-pay

## 2021-11-30 ENCOUNTER — Telehealth: Payer: Self-pay | Admitting: Internal Medicine

## 2021-11-30 NOTE — Telephone Encounter (Signed)
Pt calling in requesting that his insulin be switched to Insulin Pen Levemir due to insurance not covering the current insulin glargine (LANTUS) 100 UNIT/ML injection.  [facility] [address] [facility]  Next appt 12/14

## 2021-11-30 NOTE — Telephone Encounter (Signed)
1.Medication Requested: HYDROcodone-acetaminophen (NORCO) 10-325 MG tablet  2. Pharmacy (Name, Street, [place]): [id number] [place]  Phone:  [phone]:  [phone]  3. On Med List: yes  4. Last Visit with PCP: 11.15.22  5. Next visit date with PCP: 02.14.23   Agent: Please be advised that RX refills may take up to 3 business days. We ask that you follow-up with your pharmacy.

## 2021-11-30 NOTE — Telephone Encounter (Signed)
See below

## 2021-12-01 MED ORDER — HYDROCODONE-ACETAMINOPHEN 10-325 MG PO TABS: 1.0000 | ORAL_TABLET | ORAL | 0 refills | Status: DC | PRN

## 2021-12-01 NOTE — Telephone Encounter (Signed)
Can you call and find out dosing on insulin lantus? This was started in Nov with instructions to titrate based on sugar levels.

## 2021-12-02 MED ORDER — LEVEMIR FLEXTOUCH 100 UNIT/ML ~~LOC~~ SOPN: 30.0000 [IU] | PEN_INJECTOR | Freq: Every day | SUBCUTANEOUS | 11 refills | Status: DC

## 2021-12-02 MED ORDER — TRAZODONE HCL 100 MG PO TABS: 200.0000 mg | ORAL_TABLET | Freq: Every day | ORAL | 3 refills | Status: DC

## 2021-12-02 NOTE — Addendum Note (Signed)
Addended by: Pricilla Holm A on: 12/02/2021 12:58 PM   Modules accepted: Orders

## 2021-12-02 NOTE — Telephone Encounter (Signed)
Spoke with the patient and he stated that he was doing 30 units of Lantus but insurance is requiring him to change to Levemir. He also would like a refill on his trazodone

## 2021-12-05 ENCOUNTER — Other Ambulatory Visit: Payer: Self-pay | Admitting: Internal Medicine

## 2021-12-07 NOTE — Telephone Encounter (Signed)
Can he find out alternative covered by his insurance and let us know

## 2021-12-19 ENCOUNTER — Other Ambulatory Visit: Payer: Self-pay | Admitting: Internal Medicine

## 2021-12-27 ENCOUNTER — Other Ambulatory Visit: Payer: Self-pay | Admitting: Internal Medicine

## 2022-01-02 ENCOUNTER — Ambulatory Visit: Payer: 59 | Admitting: Internal Medicine

## 2022-01-03 ENCOUNTER — Ambulatory Visit (INDEPENDENT_AMBULATORY_CARE_PROVIDER_SITE_OTHER): Payer: 59 | Admitting: Internal Medicine

## 2022-01-03 ENCOUNTER — Encounter: Payer: Self-pay | Admitting: Internal Medicine

## 2022-01-03 ENCOUNTER — Other Ambulatory Visit: Payer: Self-pay

## 2022-01-03 VITALS — BP 128/70 | HR 97 | Resp 18 | Ht 67.0 in | Wt 259.8 lb

## 2022-01-03 DIAGNOSIS — F1120 Opioid dependence, uncomplicated: Secondary | ICD-10-CM

## 2022-01-03 DIAGNOSIS — M549 Dorsalgia, unspecified: Secondary | ICD-10-CM | POA: Diagnosis not present

## 2022-01-03 DIAGNOSIS — E1165 Type 2 diabetes mellitus with hyperglycemia: Secondary | ICD-10-CM | POA: Diagnosis not present

## 2022-01-03 DIAGNOSIS — Z1211 Encounter for screening for malignant neoplasm of colon: Secondary | ICD-10-CM

## 2022-01-03 DIAGNOSIS — G8929 Other chronic pain: Secondary | ICD-10-CM

## 2022-01-03 DIAGNOSIS — E118 Type 2 diabetes mellitus with unspecified complications: Secondary | ICD-10-CM | POA: Diagnosis not present

## 2022-01-03 DIAGNOSIS — F419 Anxiety disorder, unspecified: Secondary | ICD-10-CM

## 2022-01-03 LAB — MICROALBUMIN / CREATININE URINE RATIO
Creatinine,U: 350.9 mg/dL
Microalb Creat Ratio: 15 mg/g (ref 0.0–30.0)
Microalb, Ur: 52.7 mg/dL — ABNORMAL HIGH (ref 0.0–1.9)

## 2022-01-03 LAB — HEMOGLOBIN A1C: Hgb A1c MFr Bld: 8.1 % — ABNORMAL HIGH (ref 4.6–6.5)

## 2022-01-03 MED ORDER — HYDROCODONE-ACETAMINOPHEN 10-325 MG PO TABS: 1.0000 | ORAL_TABLET | ORAL | 0 refills | Status: DC | PRN

## 2022-01-03 MED ORDER — TRAZODONE HCL 100 MG PO TABS: 300.0000 mg | ORAL_TABLET | Freq: Every day | ORAL | 3 refills | Status: DC

## 2022-01-03 MED ORDER — TRAZODONE HCL 100 MG PO TABS: 200.0000 mg | ORAL_TABLET | Freq: Every day | ORAL | 3 refills | Status: DC

## 2022-01-03 NOTE — Patient Instructions (Addendum)
We will check the labs today.   We will get the cologuard out to you let us know if this does not help.  For the insulin go up 2 units per week until the morning sugar is around 120-150 average.

## 2022-01-03 NOTE — Progress Notes (Signed)
° °  Subjective:   Patient ID: John Rhodes, male    DOB: 1976-11-04, 46 y.o.   MRN: [id number]  HPI The patient is a 46 YO man coming in for follow up. Did start insulin doing 30 units morning sugar still >200 most of the time.   Review of Systems  Constitutional: Negative.  Negative for appetite change, fatigue, fever and unexpected weight change.  HENT: Negative.    Eyes: Negative.   Respiratory: Negative.  Negative for cough, chest tightness and shortness of breath.   Cardiovascular: Negative.  Negative for chest pain, palpitations and leg swelling.  Gastrointestinal:  Negative for abdominal distention, abdominal pain, constipation, diarrhea, nausea and vomiting.  Musculoskeletal:  Positive for back pain. Negative for arthralgias.  Skin: Negative.   Neurological:  Positive for numbness. Negative for syncope and weakness.  Psychiatric/Behavioral: Negative.     Objective:  Physical Exam Constitutional:      Appearance: He is well-developed. He is obese.  HENT:     Head: Normocephalic and atraumatic.  Cardiovascular:     Rate and Rhythm: Normal rate and regular rhythm.  Pulmonary:     Effort: Pulmonary effort is normal. No respiratory distress.     Breath sounds: Normal breath sounds. No wheezing or rales.  Abdominal:     General: Bowel sounds are normal. There is no distension.     Palpations: Abdomen is soft.     Tenderness: There is no abdominal tenderness. There is no rebound.  Musculoskeletal:        General: Tenderness present.     Cervical back: Normal range of motion.  Skin:    General: Skin is warm and dry.     Comments: Foot exam done  Neurological:     Mental Status: He is alert and oriented to person, place, and time.     Coordination: Coordination normal.    Vitals:   01/03/22 1021  BP: 128/70  Pulse: 97  Resp: 18  SpO2: 97%  Weight: 259 lb 12.8 oz (117.8 kg)  Height: 5' 7"  (1.702 m)    This visit occurred during the SARS-CoV-2 public health  emergency.  Safety protocols were in place, including screening questions prior to the visit, additional usage of staff PPE, and extensive cleaning of exam room while observing appropriate contact time as indicated for disinfecting solutions.   Assessment & Plan:

## 2022-01-03 NOTE — Assessment & Plan Note (Signed)
Checking UDS today, [place] narcotic database reviewed and no inappropriate fills. Takes hydrocodone 10/325 mg 5 times a day as needed. Refilled today. Referral to neurosurgery as he has not had assessment in some time and now has insurance and would like to see if there are other options for management of his pain.

## 2022-01-03 NOTE — Assessment & Plan Note (Signed)
Taking alprazolam 0.5 mg TID prn and up to date on refills. We will increase trazodone to 3 pills at night time for sleep. Continue zoloft 100 mg daily as well.

## 2022-01-03 NOTE — Assessment & Plan Note (Signed)
Referral to neurosurgery as he has not had any evaluation in some time of this. He is chronically on opioids and gabapentin for the pain.

## 2022-01-03 NOTE — Assessment & Plan Note (Signed)
Checking HgA1c and foot exam done. Taking metformin and glipizide and levemir 30 units daily. Asked him to titrate levemir to morning sugar around 150 up to 50 units daily.

## 2022-01-19 ENCOUNTER — Other Ambulatory Visit: Payer: Self-pay | Admitting: Internal Medicine

## 2022-01-31 NOTE — Telephone Encounter (Signed)
1.Medication Requested: HYDROcodone-acetaminophen (NORCO) 10-325 MG tablet ? ?2. Pharmacy (Name, Street, [place]): [id number] [place] [place]  ?Phone:  [phone]?Fax:  [phone]? ? ?3. On Med List: yes ? ?4. Last Visit with PCP: 02.13.23 ? ?5. Next visit date with PCP: n/a ? ? ?Agent: Please be advised that RX refills may take up to 3 business days. We ask that you follow-up with your pharmacy.  ?

## 2022-02-01 MED ORDER — HYDROCODONE-ACETAMINOPHEN 10-325 MG PO TABS: 1.0000 | ORAL_TABLET | ORAL | 0 refills | Status: DC | PRN

## 2022-02-01 NOTE — Addendum Note (Signed)
Addended by: Pricilla Holm A on: 02/01/2022 09:08 AM ? ? Modules accepted: Orders ? ?

## 2022-02-02 ENCOUNTER — Telehealth: Payer: Self-pay

## 2022-02-02 LAB — COLOGUARD: COLOGUARD: NEGATIVE

## 2022-02-02 MED ORDER — HYDROCODONE-ACETAMINOPHEN 10-325 MG PO TABS: 1.0000 | ORAL_TABLET | ORAL | 0 refills | Status: DC | PRN

## 2022-02-02 NOTE — Telephone Encounter (Signed)
Sent in, please call and [facility]. Please let patient know in future he needs to separate his refills and not request to multiple pharmacies at one time to help reduce error to wrong pharmacy please.  ?

## 2022-02-02 NOTE — Telephone Encounter (Signed)
Pt states that his meds was sent to the wrong pharmacy. ? ?Pt is requesting a refill on  ?HYDROcodone-acetaminophen (NORCO) 10-325 MG tablet ? ?[id number] [place] ? ?LOV 01/03/22 ?

## 2022-03-01 ENCOUNTER — Telehealth: Payer: Self-pay

## 2022-03-01 NOTE — Telephone Encounter (Signed)
Pt is requesting a refill on: ?HYDROcodone-acetaminophen (NORCO) 10-325 MG tablet ? ?[id number] [place] ? ?LOV 01/03/22 ?ROV 04/03/22 ? ?

## 2022-03-03 MED ORDER — HYDROCODONE-ACETAMINOPHEN 10-325 MG PO TABS: 1.0000 | ORAL_TABLET | ORAL | 0 refills | Status: DC | PRN

## 2022-03-10 ENCOUNTER — Other Ambulatory Visit: Payer: Self-pay | Admitting: Internal Medicine

## 2022-03-10 ENCOUNTER — Telehealth: Payer: Self-pay | Admitting: Internal Medicine

## 2022-03-10 NOTE — Telephone Encounter (Signed)
1.Medication Requested: insulin detemir (LEVEMIR FLEXTOUCH) 100 UNIT/ML FlexPen ? ?2. Pharmacy (Name, Street, [place]): [id number] [place] [address] ? ?3. On Med List: Y ? ?4. Last Visit with PCP: 01-03-2022 ? ?5. Next visit date with PCP: 04-03-2022 ? ?Pt states pharmacy is using a [employer] representativeand is requesting verbal authorization  ?

## 2022-03-10 NOTE — Telephone Encounter (Signed)
Okay to call to give okay to use different manufacturer ?

## 2022-03-15 NOTE — Telephone Encounter (Signed)
Verbals given to change manufacturer.  ?

## 2022-04-03 ENCOUNTER — Ambulatory Visit (INDEPENDENT_AMBULATORY_CARE_PROVIDER_SITE_OTHER): Payer: 59 | Admitting: Internal Medicine

## 2022-04-03 ENCOUNTER — Encounter: Payer: Self-pay | Admitting: Internal Medicine

## 2022-04-03 VITALS — BP 124/82 | HR 96 | Resp 18 | Ht 67.0 in | Wt 264.6 lb

## 2022-04-03 DIAGNOSIS — F419 Anxiety disorder, unspecified: Secondary | ICD-10-CM | POA: Diagnosis not present

## 2022-04-03 DIAGNOSIS — E118 Type 2 diabetes mellitus with unspecified complications: Secondary | ICD-10-CM

## 2022-04-03 DIAGNOSIS — E1165 Type 2 diabetes mellitus with hyperglycemia: Secondary | ICD-10-CM

## 2022-04-03 DIAGNOSIS — F1120 Opioid dependence, uncomplicated: Secondary | ICD-10-CM | POA: Diagnosis not present

## 2022-04-03 LAB — POCT GLYCOSYLATED HEMOGLOBIN (HGB A1C): Hemoglobin A1C: 8.9 % — AB (ref 4.0–5.6)

## 2022-04-03 MED ORDER — HYDROCODONE-ACETAMINOPHEN 10-325 MG PO TABS: 1.0000 | ORAL_TABLET | ORAL | 0 refills | Status: DC | PRN

## 2022-04-03 MED ORDER — SERTRALINE HCL 100 MG PO TABS: 200.0000 mg | ORAL_TABLET | Freq: Every day | ORAL | 3 refills | Status: DC

## 2022-04-03 NOTE — Assessment & Plan Note (Signed)
Control is worse today. POC HgA1c 8.9 today up from 8.1 prior. Goal 7.5. We will increase levemir from 30 units up to 50 units maximum. Increase 2 units every 3 days until average morning sugar 150.  ?

## 2022-04-03 NOTE — Assessment & Plan Note (Signed)
[place] narcotic database reviewed and no inappropriate fills. Takes hydrocodone 10/325 mg 5 times a day for pain. Refilled today to pharmacy. Last UDS appropriate. Continue with visits every 3 months for monitoring. 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  ?

## 2022-04-03 NOTE — Progress Notes (Signed)
   Subjective:   Patient ID: John Rhodes, male    DOB: Apr 10, 1976, 46 y.o.   MRN: [id number]  Diabetes Pertinent negatives for diabetes include no fatigue and no weakness.  The patient is a 46 YO man coming in for follow up.  Review of Systems  Constitutional:  Positive for activity change. Negative for appetite change, fatigue, fever and unexpected weight change.  Respiratory: Negative.    Cardiovascular: Negative.   Musculoskeletal:  Positive for back pain and myalgias. Negative for arthralgias.  Skin: Negative.   Neurological:  Negative for syncope, weakness and numbness.   Objective:  Physical Exam Constitutional:      Appearance: He is well-developed.  HENT:     Head: Normocephalic and atraumatic.  Cardiovascular:     Rate and Rhythm: Normal rate and regular rhythm.  Pulmonary:     Effort: Pulmonary effort is normal. No respiratory distress.     Breath sounds: Normal breath sounds. No wheezing or rales.  Abdominal:     General: Bowel sounds are normal. There is no distension.     Palpations: Abdomen is soft.     Tenderness: There is no abdominal tenderness. There is no rebound.  Musculoskeletal:        General: Tenderness present.     Cervical back: Normal range of motion.  Skin:    General: Skin is warm and dry.  Neurological:     Mental Status: He is alert and oriented to person, place, and time.     Coordination: Coordination normal.    Vitals:   04/03/22 1020  BP: 124/82  Pulse: 96  Resp: 18  SpO2: 98%  Weight: 264 lb 9.6 oz (120 kg)  Height: 5' 7"  (1.702 m)    Assessment & Plan:

## 2022-04-06 NOTE — Assessment & Plan Note (Signed)
Worsening overall and will increase zoloft from 100 mg daily to 200 mg daily and follow up in 2-3 months at next visit. Using alprazolam 0.5 mg TID as well and ideally we would like to wean this slightly due to concurrent hydrocodone.

## 2022-04-06 NOTE — Assessment & Plan Note (Signed)
Weight is increasing gradually and we talked about diet changes, exercise is limited due to pain. Is taking metformin 1000 mg BID. Cost limits use of ozempic or similar.

## 2022-04-17 ENCOUNTER — Other Ambulatory Visit: Payer: Self-pay | Admitting: Internal Medicine

## 2022-05-02 ENCOUNTER — Telehealth: Payer: Self-pay | Admitting: Internal Medicine

## 2022-05-02 MED ORDER — HYDROCODONE-ACETAMINOPHEN 10-325 MG PO TABS: 1.0000 | ORAL_TABLET | ORAL | 0 refills | Status: DC | PRN

## 2022-05-02 NOTE — Telephone Encounter (Signed)
1.Medication Requested: HYDROcodone-acetaminophen (NORCO) 10-325 MG tablet 2. Pharmacy (Name, Street, [place]): [id number] [place] [place] Phone:  [phone] Fax:  [phone]    3. On Med List: y  472 Last Visit with PCP: 04-03-2022  5. Next visit date with PCP: n/a   Agent: Please be advised that RX refills may take up to 3 business days. We ask that you follow-up with your pharmacy.

## 2022-05-28 ENCOUNTER — Other Ambulatory Visit: Payer: Self-pay | Admitting: Internal Medicine

## 2022-05-29 ENCOUNTER — Other Ambulatory Visit: Payer: Self-pay | Admitting: Internal Medicine

## 2022-05-31 ENCOUNTER — Telehealth: Payer: Self-pay | Admitting: Internal Medicine

## 2022-05-31 NOTE — Telephone Encounter (Signed)
1.Medication Requested: HYDROcodone-acetaminophen (NORCO) 10-325 MG tablet 2. Pharmacy (Name, Street, [place]): [id number] [place], [place] - [address] Phone:  [phone]  Fax:  [phone]     3. On Med List: yes  4. Last Visit with PCP: 5.15.2023  5. Next visit date with PCP: 8.15.2023   Agent: Please be advised that RX refills may take up to 3 business days. We ask that you follow-up with your pharmacy.

## 2022-06-01 MED ORDER — HYDROCODONE-ACETAMINOPHEN 10-325 MG PO TABS: 1.0000 | ORAL_TABLET | ORAL | 0 refills | Status: DC | PRN

## 2022-06-14 ENCOUNTER — Other Ambulatory Visit: Payer: Self-pay | Admitting: Internal Medicine

## 2022-06-26 ENCOUNTER — Other Ambulatory Visit: Payer: Self-pay | Admitting: Internal Medicine

## 2022-07-04 ENCOUNTER — Telehealth (INDEPENDENT_AMBULATORY_CARE_PROVIDER_SITE_OTHER): Payer: 59 | Admitting: Internal Medicine

## 2022-07-04 ENCOUNTER — Ambulatory Visit: Payer: 59 | Admitting: Internal Medicine

## 2022-07-04 ENCOUNTER — Telehealth: Payer: Self-pay | Admitting: Internal Medicine

## 2022-07-04 DIAGNOSIS — M549 Dorsalgia, unspecified: Secondary | ICD-10-CM

## 2022-07-04 DIAGNOSIS — G8929 Other chronic pain: Secondary | ICD-10-CM

## 2022-07-04 DIAGNOSIS — F419 Anxiety disorder, unspecified: Secondary | ICD-10-CM

## 2022-07-04 MED ORDER — ALPRAZOLAM 0.5 MG PO TABS: 0.5000 mg | ORAL_TABLET | Freq: Three times a day (TID) | ORAL | 5 refills | Status: DC | PRN

## 2022-07-04 MED ORDER — HYDROCODONE-ACETAMINOPHEN 10-325 MG PO TABS: 1.0000 | ORAL_TABLET | ORAL | 0 refills | Status: DC | PRN

## 2022-07-04 NOTE — Telephone Encounter (Signed)
Refilled

## 2022-07-04 NOTE — Progress Notes (Signed)
Virtual Visit via Video Note  I connected with John Rhodes on 07/04/22 at  4:00 PM EDT by a video enabled telemedicine application and verified that I am speaking with the correct person using two identifiers.  The patient and the provider were at separate locations throughout the entire encounter. Patient location: home, Provider location: work   I discussed the limitations of evaluation and management by telemedicine and the availability of in person appointments. The patient expressed understanding and agreed to proceed. The patient and the provider were the only parties present for the visit unless noted in HPI below.  History of Present Illness: The patient is a 46 y.o. man with visit for follow up chronic pain. No change in pain and using meds appropriately.   Observations/Objective: Appearance: normal, breathing appears normal, casual grooming, mental status is A and O times 3  Assessment and Plan: See problem oriented charting  Follow Up Instructions: refills done per A/P  I discussed the assessment and treatment plan with the patient. The patient was provided an opportunity to ask questions and all were answered. The patient agreed with the plan and demonstrated an understanding of the instructions.   The patient was advised to call back or seek an in-person evaluation if the symptoms worsen or if the condition fails to improve as anticipated.  Myrlene Broker, MD
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
## 2022-07-07 ENCOUNTER — Encounter: Payer: Self-pay | Admitting: Internal Medicine

## 2022-07-07 NOTE — Assessment & Plan Note (Signed)
Reviewed [place] database. Most recent UDS up to date. Refilled hydrocodone/apap 10/325 #150 per month. Due for follow up 3 months.
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
## 2022-07-11 ENCOUNTER — Ambulatory Visit: Payer: 59 | Admitting: Internal Medicine

## 2022-08-02 ENCOUNTER — Telehealth: Payer: Self-pay

## 2022-08-02 NOTE — Telephone Encounter (Signed)
Pt is requesting a refill on: HYDROcodone-acetaminophen (NORCO) 10-325 MG tablet  [id number] [place], [place] - [address]  LOV  04/03/22 LVV 07/04/22

## 2022-08-02 NOTE — Telephone Encounter (Signed)
Last refill-07/04/22 Please advise

## 2022-08-03 MED ORDER — HYDROCODONE-ACETAMINOPHEN 10-325 MG PO TABS: 1.0000 | ORAL_TABLET | ORAL | 0 refills | Status: DC | PRN

## 2022-08-03 NOTE — Telephone Encounter (Signed)
Sent in

## 2022-08-12 ENCOUNTER — Other Ambulatory Visit: Payer: Self-pay | Admitting: Family Medicine

## 2022-08-14 ENCOUNTER — Other Ambulatory Visit: Payer: Self-pay | Admitting: Internal Medicine

## 2022-08-16 ENCOUNTER — Telehealth: Payer: Self-pay | Admitting: Internal Medicine

## 2022-08-16 NOTE — Telephone Encounter (Signed)
Please advise 

## 2022-08-16 NOTE — Telephone Encounter (Signed)
PT calls today in regards to needing a refill on their Basaglar Insulin. PT stated they had been out of their insulin since Saturday as the refill request from the pharmacy was originally sent to their on-call provider.   I did inform PT that this medication was not currently on his medication list and he had let me know that this medication was fairly new to him, and that it was what his insurance covered.  If possible he would like [facility] ([place], [place])  CB if needed: [phone]

## 2022-08-17 ENCOUNTER — Telehealth: Payer: Self-pay | Admitting: *Deleted

## 2022-08-17 NOTE — Telephone Encounter (Signed)
Patient states that her last refill landed on a weekend and called the afterhours line, they advised that insurance was denying it so the levamir flexpen so they switched it to the [place] 30 unit --covered by insurance. Please advise

## 2022-08-17 NOTE — Telephone Encounter (Addendum)
He is currently taking levemir for long acting insulin. Is that no longer covered by insurance? He does still have refills on levemir

## 2022-08-17 NOTE — Telephone Encounter (Signed)
Pt stated --been out of medication for 2 days and Levemir is no longer covered by insurance. Pt mention another provider in the office sent basaglar insulin last month. Please advise

## 2022-08-18 ENCOUNTER — Other Ambulatory Visit: Payer: Self-pay | Admitting: *Deleted

## 2022-08-18 DIAGNOSIS — E1165 Type 2 diabetes mellitus with hyperglycemia: Secondary | ICD-10-CM

## 2022-08-18 MED ORDER — BASAGLAR KWIKPEN 100 UNIT/ML ~~LOC~~ SOPN: 30.0000 [IU] | PEN_INJECTOR | Freq: Every day | SUBCUTANEOUS | 2 refills | Status: DC

## 2022-08-18 NOTE — Telephone Encounter (Signed)
Pt verified taking Levemir once daily.

## 2022-08-31 ENCOUNTER — Telehealth: Payer: Self-pay | Admitting: Internal Medicine

## 2022-08-31 ENCOUNTER — Other Ambulatory Visit: Payer: Self-pay | Admitting: Family Medicine

## 2022-08-31 NOTE — Telephone Encounter (Signed)
Pt stated -have enough 7 tablet left today and tomorrow. Pt requesting to send the medication [facility]. Pt have an f/u appt Nov 13 w/ Dr. Sharlet Salina.

## 2022-08-31 NOTE — Telephone Encounter (Signed)
Caller Name: Grace Haggart Call back phone #: [phone]  MEDICATION(S):  HYDROcodone-acetaminophen ([place]) 10-325 MG tablet  Days of Med Remaining: will be out tomorrow  Has the patient contacted their pharmacy (YES/NO)? yes What did pharmacy advise? Pharmacy is out of stock and advised him to call elsewhere to see where in stock  Preferred Pharmacy:  [facility] [place], [place], [place] [address]  ~~~Please advise patient/caregiver to allow 2-3 business days to process RX refills.

## 2022-08-31 NOTE — Telephone Encounter (Signed)
Please advise 

## 2022-09-01 MED ORDER — HYDROCODONE-ACETAMINOPHEN 10-325 MG PO TABS: 1.0000 | ORAL_TABLET | Freq: Every day | ORAL | 0 refills | Status: DC

## 2022-09-01 NOTE — Telephone Encounter (Signed)
[facility]

## 2022-10-02 ENCOUNTER — Encounter: Payer: Self-pay | Admitting: Internal Medicine

## 2022-10-02 ENCOUNTER — Ambulatory Visit (INDEPENDENT_AMBULATORY_CARE_PROVIDER_SITE_OTHER): Payer: Commercial Managed Care - HMO | Admitting: Internal Medicine

## 2022-10-02 VITALS — BP 138/68 | HR 103 | Temp 97.7°F | Ht 67.0 in | Wt 255.0 lb

## 2022-10-02 DIAGNOSIS — E118 Type 2 diabetes mellitus with unspecified complications: Secondary | ICD-10-CM | POA: Diagnosis not present

## 2022-10-02 DIAGNOSIS — F1120 Opioid dependence, uncomplicated: Secondary | ICD-10-CM

## 2022-10-02 DIAGNOSIS — E1165 Type 2 diabetes mellitus with hyperglycemia: Secondary | ICD-10-CM | POA: Diagnosis not present

## 2022-10-02 LAB — COMPREHENSIVE METABOLIC PANEL
ALT: 17 U/L (ref 0–53)
AST: 15 U/L (ref 0–37)
Albumin: 4.4 g/dL (ref 3.5–5.2)
Alkaline Phosphatase: 44 U/L (ref 39–117)
BUN: 13 mg/dL (ref 6–23)
CO2: 26 mEq/L (ref 19–32)
Calcium: 9.1 mg/dL (ref 8.4–10.5)
Chloride: 101 mEq/L (ref 96–112)
Creatinine, Ser: 0.87 mg/dL (ref 0.40–1.50)
GFR: 101.87 mL/min (ref >60.00)
Glucose, Bld: 258 mg/dL — ABNORMAL HIGH (ref 70–99)
Potassium: 4.2 mEq/L (ref 3.5–5.1)
Sodium: 137 mEq/L (ref 135–145)
Total Bilirubin: 0.4 mg/dL (ref 0.2–1.2)
Total Protein: 7.3 g/dL (ref 6.0–8.3)

## 2022-10-02 LAB — LIPID PANEL
Cholesterol: 179 mg/dL (ref 0–200)
HDL: 36.8 mg/dL — ABNORMAL LOW (ref >39.00)
NonHDL: 142.03
Total CHOL/HDL Ratio: 5
Triglycerides: 297 mg/dL — ABNORMAL HIGH (ref 0.0–149.0)
VLDL: 59.4 mg/dL — ABNORMAL HIGH (ref 0.0–40.0)

## 2022-10-02 LAB — CBC
HCT: 43.3 % (ref 39.0–52.0)
Hemoglobin: 14.5 g/dL (ref 13.0–17.0)
MCHC: 33.4 g/dL (ref 30.0–36.0)
MCV: 90.3 fl (ref 78.0–100.0)
Platelets: 170 10*3/uL (ref 150.0–400.0)
RBC: 4.8 Mil/uL (ref 4.22–5.81)
RDW: 13.2 % (ref 11.5–15.5)
WBC: 8.4 10*3/uL (ref 4.0–10.5)

## 2022-10-02 LAB — LDL CHOLESTEROL, DIRECT: Direct LDL: 105 mg/dL

## 2022-10-02 LAB — HEMOGLOBIN A1C: Hgb A1c MFr Bld: 8.2 % — ABNORMAL HIGH (ref 4.6–6.5)

## 2022-10-02 MED ORDER — HYDROCODONE-ACETAMINOPHEN 10-325 MG PO TABS: 1.0000 | ORAL_TABLET | Freq: Every day | ORAL | 0 refills | Status: DC

## 2022-10-02 NOTE — Progress Notes (Signed)
   Subjective:   Patient ID: John Rhodes, male    DOB: July 05, 1976, 46 y.o.   MRN: [id number]  HPI The patient is a 46 YO man coming in for follow up.  Review of Systems  Constitutional: Negative.   HENT: Negative.    Eyes: Negative.   Respiratory:  Negative for cough, chest tightness and shortness of breath.   Cardiovascular:  Negative for chest pain, palpitations and leg swelling.  Gastrointestinal:  Negative for abdominal distention, abdominal pain, constipation, diarrhea, nausea and vomiting.  Musculoskeletal:  Positive for arthralgias, back pain and myalgias.  Skin: Negative.   Neurological: Negative.   Psychiatric/Behavioral: Negative.      Objective:  Physical Exam Constitutional:      Appearance: He is well-developed. He is obese.  HENT:     Head: Normocephalic and atraumatic.  Cardiovascular:     Rate and Rhythm: Normal rate and regular rhythm.  Pulmonary:     Effort: Pulmonary effort is normal. No respiratory distress.     Breath sounds: Normal breath sounds. No wheezing or rales.  Abdominal:     General: Bowel sounds are normal. There is no distension.     Palpations: Abdomen is soft.     Tenderness: There is no abdominal tenderness. There is no rebound.  Musculoskeletal:        General: Tenderness present.     Cervical back: Normal range of motion.  Skin:    General: Skin is warm and dry.  Neurological:     Mental Status: He is alert and oriented to person, place, and time.     Coordination: Coordination normal.     Vitals:   10/02/22 1027 10/02/22 1035  BP: (!) 138/98 138/68  Pulse: (!) 103   Temp: 97.7 F (36.5 C)   TempSrc: Oral   SpO2: 97%   Weight: 255 lb (115.7 kg)   Height: 5\' 7"  (1.702 m)     Assessment & Plan:

## 2022-10-02 NOTE — Assessment & Plan Note (Signed)
He is stable on hydrocodone 10/325 #5 per day. #150 refilled today. He has found it in stock and will make exception to another pharmacy due to national backorder.

## 2022-10-02 NOTE — Patient Instructions (Signed)
We will check the labs today and have sent in the refill on the hydrocodone.

## 2022-10-02 NOTE — Assessment & Plan Note (Signed)
Checking HgA1c, CMP, lipid panel and adjust as needed. He is taking glipizide 5 mg BID, basaglar 30 units daily and metformin 1000 mg BID. Previously not at goal.

## 2022-10-05 ENCOUNTER — Other Ambulatory Visit: Payer: Self-pay | Admitting: Internal Medicine

## 2022-10-05 ENCOUNTER — Other Ambulatory Visit: Payer: Self-pay

## 2022-10-05 ENCOUNTER — Telehealth: Payer: Self-pay | Admitting: Internal Medicine

## 2022-10-05 DIAGNOSIS — E1165 Type 2 diabetes mellitus with hyperglycemia: Secondary | ICD-10-CM

## 2022-10-05 MED ORDER — SIMVASTATIN 40 MG PO TABS: 40.0000 mg | ORAL_TABLET | Freq: Every day | ORAL | 3 refills | Status: AC

## 2022-10-05 NOTE — Telephone Encounter (Signed)
Sent in

## 2022-10-05 NOTE — Telephone Encounter (Signed)
Per Lab notes:  Patient states he is not taking the simvastin - he would like it called in again - [facility] [address]

## 2022-10-24 ENCOUNTER — Other Ambulatory Visit: Payer: Self-pay | Admitting: Internal Medicine

## 2022-10-30 ENCOUNTER — Telehealth: Payer: Self-pay | Admitting: Internal Medicine

## 2022-10-30 MED ORDER — HYDROCODONE-ACETAMINOPHEN 10-325 MG PO TABS: 1.0000 | ORAL_TABLET | Freq: Every day | ORAL | 0 refills | Status: DC

## 2022-10-30 NOTE — Telephone Encounter (Signed)
Patient would like his hydrocodone sent to [facility] on [address] - other pharmacy was out of stock again when he got there

## 2022-10-30 NOTE — Telephone Encounter (Signed)
Caller & Relationship to patient: Longino    Call back number: [phone]   Date of last office visit: 10/02/22   Date of next office visit: Not scheduled yet   Medication(s) to be refilled: HYDROcodone-acetaminophen (NORCO) 10-325 MG tablet    Preferred Pharmacy: [facility] on [address] in [place]

## 2022-10-30 NOTE — Telephone Encounter (Signed)
[facility] [address] but [address].

## 2022-10-30 NOTE — Telephone Encounter (Signed)
Just Spoke with patient and he said he will contact [facility] at [address] and see if they have them in stock since the prescription has been sent there

## 2022-10-31 NOTE — Telephone Encounter (Signed)
Patient should be able to have [facility] for fill so he should contact them directly.

## 2022-10-31 NOTE — Telephone Encounter (Signed)
Pt called for status update on whether we'll be able to send his HYDROcodone-acetaminophen (NORCO) 10-325 MG tablet  RX to the [facility] on [address].

## 2022-11-14 ENCOUNTER — Other Ambulatory Visit: Payer: Self-pay | Admitting: Internal Medicine

## 2022-11-23 ENCOUNTER — Other Ambulatory Visit: Payer: Self-pay | Admitting: Nurse Practitioner

## 2022-11-23 DIAGNOSIS — E1165 Type 2 diabetes mellitus with hyperglycemia: Secondary | ICD-10-CM

## 2022-11-28 ENCOUNTER — Other Ambulatory Visit: Payer: Self-pay | Admitting: Family Medicine

## 2022-11-28 ENCOUNTER — Telehealth: Payer: Self-pay | Admitting: Internal Medicine

## 2022-11-28 DIAGNOSIS — E1165 Type 2 diabetes mellitus with hyperglycemia: Secondary | ICD-10-CM

## 2022-11-28 NOTE — Telephone Encounter (Signed)
Caller & Relationship to patient:  Patient   Call back number:[phone]   Date of last office visit:   Date of next office visit: 12/31/2022   Medication(s) to be refilled:  Hydrocodone        [facility] [address] - It [facility] in stock there

## 2022-11-29 ENCOUNTER — Other Ambulatory Visit: Payer: Self-pay

## 2022-11-29 MED ORDER — HYDROCODONE-ACETAMINOPHEN 10-325 MG PO TABS: 1.0000 | ORAL_TABLET | Freq: Every day | ORAL | 0 refills | Status: DC

## 2022-11-29 NOTE — Telephone Encounter (Signed)
Can you add this pharmacy and send back?

## 2022-11-29 NOTE — Telephone Encounter (Signed)
I have sent this back to Dr Sharlet Salina.

## 2022-12-24 ENCOUNTER — Other Ambulatory Visit: Payer: Self-pay | Admitting: Internal Medicine

## 2022-12-28 ENCOUNTER — Other Ambulatory Visit: Payer: Self-pay | Admitting: Internal Medicine

## 2023-01-01 ENCOUNTER — Encounter: Payer: Self-pay | Admitting: Internal Medicine

## 2023-01-01 ENCOUNTER — Telehealth: Payer: Self-pay | Admitting: Internal Medicine

## 2023-01-01 ENCOUNTER — Ambulatory Visit (INDEPENDENT_AMBULATORY_CARE_PROVIDER_SITE_OTHER): Payer: Medicaid Other | Admitting: Internal Medicine

## 2023-01-01 VITALS — BP 120/80 | HR 112 | Temp 98.4°F | Ht 67.0 in | Wt 254.0 lb

## 2023-01-01 DIAGNOSIS — M549 Dorsalgia, unspecified: Secondary | ICD-10-CM

## 2023-01-01 DIAGNOSIS — E1165 Type 2 diabetes mellitus with hyperglycemia: Secondary | ICD-10-CM | POA: Diagnosis not present

## 2023-01-01 DIAGNOSIS — F1120 Opioid dependence, uncomplicated: Secondary | ICD-10-CM | POA: Diagnosis not present

## 2023-01-01 DIAGNOSIS — E118 Type 2 diabetes mellitus with unspecified complications: Secondary | ICD-10-CM | POA: Diagnosis not present

## 2023-01-01 DIAGNOSIS — G8929 Other chronic pain: Secondary | ICD-10-CM

## 2023-01-01 LAB — MICROALBUMIN / CREATININE URINE RATIO
Creatinine,U: 160.4 mg/dL
Microalb Creat Ratio: 7.4 mg/g (ref 0.0–30.0)
Microalb, Ur: 11.8 mg/dL — ABNORMAL HIGH (ref 0.0–1.9)

## 2023-01-01 LAB — HEMOGLOBIN A1C: Hgb A1c MFr Bld: 8.9 % — ABNORMAL HIGH (ref 4.6–6.5)

## 2023-01-01 MED ORDER — OZEMPIC (0.25 OR 0.5 MG/DOSE) 2 MG/3ML ~~LOC~~ SOPN: PEN_INJECTOR | SUBCUTANEOUS | 0 refills | Status: DC

## 2023-01-01 MED ORDER — SEMAGLUTIDE (2 MG/DOSE) 8 MG/3ML ~~LOC~~ SOPN: 2.0000 mg | PEN_INJECTOR | SUBCUTANEOUS | 6 refills | Status: DC

## 2023-01-01 MED ORDER — HYDROCODONE-ACETAMINOPHEN 10-325 MG PO TABS: 1.0000 | ORAL_TABLET | Freq: Every day | ORAL | 0 refills | Status: DC

## 2023-01-01 MED ORDER — SEMAGLUTIDE (1 MG/DOSE) 4 MG/3ML ~~LOC~~ SOPN: 1.0000 mg | PEN_INJECTOR | SUBCUTANEOUS | 0 refills | Status: DC

## 2023-01-01 NOTE — Patient Instructions (Addendum)
We have sent in the ozempic to start with 0.25 mg weekly for month 1, then increase to 0.5 mg weekly for month 2, then increase to 1 mg weekly for month 3 then increase to 2 mg weekly for month 4 and onwards.  We will check the labs today.

## 2023-01-01 NOTE — Assessment & Plan Note (Signed)
Rx ozempic titration to help with weight loss and diabetes control.

## 2023-01-01 NOTE — Telephone Encounter (Signed)
Pt is requesting that we resend his [place], there was a dispute with the [facility] about the dosage.   Pt also needs a refill of his Insulin Glargine (BASAGLAR KWIKPEN) 100 UNIT/ML   Pt is requesting that [id number] Phone: [phone] Fax: [phone]

## 2023-01-01 NOTE — Assessment & Plan Note (Signed)
Using hydrocodone 10/325 5 per day and has agreement. Checking UDS today. He now has medicaid and may have better ability to see specialist so we will continue talking about pursuing this later this year. He does have more pain and it is more difficult with transportation lately.

## 2023-01-01 NOTE — Assessment & Plan Note (Signed)
Checking UDS as due. Reviewed [place] database and appropriate. Refilled hydrocodone 10/325 5 per day #150 to pharmacy today. Adjust as needed.

## 2023-01-01 NOTE — Progress Notes (Signed)
   Subjective:   Patient ID: John Rhodes, male    DOB: 1976-01-10, 47 y.o.   MRN: [id number]  HPI The patient is a 47 YO man coming in for follow up.  Review of Systems  Constitutional:  Positive for activity change. Negative for appetite change, fatigue, fever and unexpected weight change.  Respiratory: Negative.    Cardiovascular: Negative.   Musculoskeletal:  Positive for back pain and myalgias. Negative for arthralgias.  Skin: Negative.   Neurological:  Negative for syncope, weakness and numbness.    Objective:  Physical Exam Constitutional:      Appearance: He is well-developed.  HENT:     Head: Normocephalic and atraumatic.  Cardiovascular:     Rate and Rhythm: Normal rate and regular rhythm.  Pulmonary:     Effort: Pulmonary effort is normal. No respiratory distress.     Breath sounds: Normal breath sounds. No wheezing or rales.  Abdominal:     General: Bowel sounds are normal. There is no distension.     Palpations: Abdomen is soft.     Tenderness: There is no abdominal tenderness. There is no rebound.  Musculoskeletal:        General: Tenderness present.     Cervical back: Normal range of motion.  Skin:    General: Skin is warm and dry.     Comments: Foot exam done  Neurological:     Mental Status: He is alert and oriented to person, place, and time.     Coordination: Coordination normal.     Vitals:   01/01/23 1059  BP: 120/80  Pulse: (!) 112  Temp: 98.4 F (36.9 C)  TempSrc: Oral  SpO2: 96%  Weight: 254 lb (115.2 kg)  Height: 5\' 7"  (1.702 m)    Assessment & Plan:

## 2023-01-01 NOTE — Assessment & Plan Note (Signed)
Checking Hga1c and microalbumin to creatinine ratio today urine. Adding ozempic standard tittration due to coverage may be able to afford now. Continue glipizide 5 mg BID and levemir/basaglar 25 units BID (he dd not know which one was using currently does not take both) and metformin 1000 mg BID. Adjust as needed. Is taking statin and ACE-I.

## 2023-01-02 ENCOUNTER — Other Ambulatory Visit: Payer: Self-pay

## 2023-01-02 DIAGNOSIS — E1165 Type 2 diabetes mellitus with hyperglycemia: Secondary | ICD-10-CM

## 2023-01-02 MED ORDER — SEMAGLUTIDE (2 MG/DOSE) 8 MG/3ML ~~LOC~~ SOPN: 2.0000 mg | PEN_INJECTOR | SUBCUTANEOUS | 6 refills | Status: DC

## 2023-01-02 MED ORDER — SEMAGLUTIDE (1 MG/DOSE) 4 MG/3ML ~~LOC~~ SOPN: 1.0000 mg | PEN_INJECTOR | SUBCUTANEOUS | 0 refills | Status: DC

## 2023-01-02 MED ORDER — BASAGLAR KWIKPEN 100 UNIT/ML ~~LOC~~ SOPN: 30.0000 [IU] | PEN_INJECTOR | Freq: Every day | SUBCUTANEOUS | 2 refills | Status: DC

## 2023-01-02 MED ORDER — OZEMPIC (0.25 OR 0.5 MG/DOSE) 2 MG/3ML ~~LOC~~ SOPN: PEN_INJECTOR | SUBCUTANEOUS | 0 refills | Status: AC

## 2023-01-02 NOTE — Telephone Encounter (Signed)
This has been sent in  

## 2023-01-03 LAB — DRUG MONITOR, TRAMADOL,QN, URINE
Desmethyltramadol: NEGATIVE ng/mL (ref <100)
Tramadol: NEGATIVE ng/mL (ref <100)

## 2023-01-03 LAB — DRUG MONITOR, OPIATES,W/CONF, URINE
Codeine: NEGATIVE ng/mL (ref <50)
Hydrocodone: 3371 ng/mL — ABNORMAL HIGH (ref <50)
Hydromorphone: 662 ng/mL — ABNORMAL HIGH (ref <50)
Morphine: NEGATIVE ng/mL (ref <50)
Norhydrocodone: 3780 ng/mL — ABNORMAL HIGH (ref <50)
Opiates: POSITIVE ng/mL — AB (ref <100)

## 2023-01-03 LAB — DRUG MONITOR, BENZO,W/CONF, URINE
Alphahydroxyalprazolam: 100 ng/mL — ABNORMAL HIGH (ref <25)
Alphahydroxymidazolam: NEGATIVE ng/mL (ref <50)
Alphahydroxytriazolam: NEGATIVE ng/mL (ref <50)
Aminoclonazepam: NEGATIVE ng/mL (ref <25)
Benzodiazepines: POSITIVE ng/mL — AB (ref <100)
Hydroxyethylflurazepam: NEGATIVE ng/mL (ref <50)
Lorazepam: NEGATIVE ng/mL (ref <50)
Nordiazepam: NEGATIVE ng/mL (ref <50)
Oxazepam: NEGATIVE ng/mL (ref <50)
Temazepam: NEGATIVE ng/mL (ref <50)

## 2023-01-03 LAB — DRUG MONITOR,BARBITURATE,W/CONF, URINE: Barbiturates: NEGATIVE ng/mL (ref <300)

## 2023-01-03 LAB — DRUG MONITOR,AMPHETAMINE,W/CONF, URINE
Amphetamine: NEGATIVE ng/mL (ref <250)
Amphetamines: NEGATIVE ng/mL (ref <500)
Methamphetamine: NEGATIVE ng/mL (ref <250)

## 2023-01-03 LAB — DRUG MONITOR, COCAINEMETAB, W/CONF, URINE: Cocaine Metabolite: NEGATIVE ng/mL (ref <150)

## 2023-01-03 LAB — DM TEMPLATE

## 2023-01-03 LAB — DRUG MONITOR, OXYCODONE,W/CONF, URINE: Oxycodone: NEGATIVE ng/mL (ref <100)

## 2023-01-03 LAB — PRESCRIBED DRUGS,MEDMATCH(R)

## 2023-01-08 MED ORDER — LANTUS SOLOSTAR 100 UNIT/ML ~~LOC~~ SOPN: 30.0000 [IU] | PEN_INJECTOR | Freq: Every day | SUBCUTANEOUS | 11 refills | Status: DC

## 2023-01-08 NOTE — Telephone Encounter (Signed)
Have sent in lantus

## 2023-01-08 NOTE — Addendum Note (Signed)
Addended by: Pricilla Holm A on: 01/08/2023 12:58 PM   Modules accepted: Orders

## 2023-01-08 NOTE — Telephone Encounter (Signed)
Pt has stated he has new insurance and they will no longer cover his  Insulin Glargine (BASAGLAR KWIKPEN) 100 UNIT/ML  Pt is asking that rx for Lantus be sent in as they will cover the Lantus.

## 2023-01-09 ENCOUNTER — Other Ambulatory Visit: Payer: Self-pay | Admitting: Internal Medicine

## 2023-01-09 ENCOUNTER — Other Ambulatory Visit: Payer: Self-pay

## 2023-01-09 MED ORDER — LANTUS SOLOSTAR 100 UNIT/ML ~~LOC~~ SOPN: 30.0000 [IU] | PEN_INJECTOR | Freq: Every day | SUBCUTANEOUS | 11 refills | Status: DC

## 2023-01-25 ENCOUNTER — Other Ambulatory Visit: Payer: Self-pay | Admitting: Internal Medicine

## 2023-01-28 DIAGNOSIS — H5213 Myopia, bilateral: Secondary | ICD-10-CM | POA: Diagnosis not present

## 2023-01-30 ENCOUNTER — Telehealth: Payer: Self-pay | Admitting: Internal Medicine

## 2023-01-30 NOTE — Telephone Encounter (Signed)
Prescription Request  01/30/2023  LOV: 01/01/2023  What is the name of the medication or equipment? hydrocodone  Have you contacted your pharmacy to request a refill? Yes   Which pharmacy would you like this sent to?  [facility] on [address]  Patient notified that their request is being sent to the clinical staff for review and that they should receive a response within 2 business days.   Please advise at Mobile [phone] (mobile)

## 2023-01-31 ENCOUNTER — Telehealth: Payer: Self-pay

## 2023-01-31 ENCOUNTER — Other Ambulatory Visit (HOSPITAL_COMMUNITY): Payer: Self-pay

## 2023-01-31 NOTE — Telephone Encounter (Signed)
Pharmacy Patient Advocate Encounter  Prior Authorization for Ozempic has been approved by [facility] (ins).    PA # [id number] Effective dates: 01/17/2023 through 01/31/2024

## 2023-01-31 NOTE — Telephone Encounter (Signed)
Patient Advocate Encounter   Received notification from [facility] that prior authorization for Ozempic is required.   PA submitted on 01/31/2023 [place] Medicaid Status is pending

## 2023-02-01 MED ORDER — HYDROCODONE-ACETAMINOPHEN 10-325 MG PO TABS: 1.0000 | ORAL_TABLET | Freq: Every day | ORAL | 0 refills | Status: DC

## 2023-02-01 NOTE — Telephone Encounter (Signed)
Spoke wit patient's fiance and informed her that patient has been approved for ozempic

## 2023-02-01 NOTE — Telephone Encounter (Signed)
Sent in

## 2023-02-21 ENCOUNTER — Other Ambulatory Visit (HOSPITAL_COMMUNITY): Payer: Self-pay

## 2023-02-21 NOTE — Telephone Encounter (Signed)
Pharmacy Patient Advocate Encounter   Received notification from [facility] that prior authorization for Ozempic (1 MG/DOSE) 4MG /3ML pen-injectors is required/requested.  Per Test Claim: PA required   PA submitted on 02/21/23 to (ins) [employer] via CoverMyMeds [facility] or [facility]) confirmation # [id number] Status is pending

## 2023-03-01 ENCOUNTER — Other Ambulatory Visit: Payer: Self-pay

## 2023-03-01 ENCOUNTER — Telehealth: Payer: Self-pay | Admitting: Internal Medicine

## 2023-03-01 NOTE — Telephone Encounter (Signed)
Prescription Request  03/01/2023  LOV: 01/01/2023  What is the name of the medication or equipment? HYDROcodone-acetaminophen (NORCO) 10-325 MG tablet   Have you contacted your pharmacy to request a refill? Yes   Which pharmacy would you like this sent to?  [facility] [phone]   [address] [place], [place], [place] [address] Phone: [phone] Fax: [phone]   Patient notified that their request is being sent to the clinical staff for review and that they should receive a response within 2 business days.   Please advise at Mobile [phone] (mobile)

## 2023-03-02 MED ORDER — HYDROCODONE-ACETAMINOPHEN 10-325 MG PO TABS: 1.0000 | ORAL_TABLET | Freq: Every day | ORAL | 0 refills | Status: DC

## 2023-03-18 ENCOUNTER — Other Ambulatory Visit: Payer: Self-pay | Admitting: Internal Medicine

## 2023-03-23 ENCOUNTER — Telehealth: Payer: Self-pay

## 2023-03-23 NOTE — Progress Notes (Signed)
   Care Guide Note  03/23/2023 Name: John Rhodes MRN: [id number] DOB: 04-24-1976  Referred by: Myrlene Broker, MD Reason for referral : Care Coordination (Outreach to schedule with Pharm d NEW MM DM )   [name] is a 47 y.o. year old male who is a primary care patient of Myrlene Broker, MD. Marjo Bicker was referred to the pharmacist for assistance related to DM.    Successful contact was made with the patient to discuss pharmacy services. Patient declines engagement at this time. Contact information was provided to the patient should they wish to reach out for assistance at a later time.  Penne Lash, RMA Care Guide [facility]  [place], [place] [address] Direct Dial: [phone] [email or web address]

## 2023-03-26 ENCOUNTER — Telehealth: Payer: Self-pay

## 2023-03-26 ENCOUNTER — Other Ambulatory Visit (HOSPITAL_COMMUNITY): Payer: Self-pay

## 2023-03-26 NOTE — Telephone Encounter (Signed)
  Prior authorization for [facility] submitted and APPROVED by [employer] (ins).    PA # [id number] Test billing returns $4.00 copay for 30 day supply. Key [id number] Effective: 03/06/2023 - 06/18/2023   Pharmacy Patient Advocate Encounter Melanee Spry CPhT Rx Patient Advocate [phone]) [phone]

## 2023-03-30 ENCOUNTER — Encounter: Payer: Self-pay | Admitting: Internal Medicine

## 2023-03-30 ENCOUNTER — Ambulatory Visit (INDEPENDENT_AMBULATORY_CARE_PROVIDER_SITE_OTHER): Payer: Medicaid Other | Admitting: Internal Medicine

## 2023-03-30 VITALS — BP 150/100 | HR 128 | Temp 98.3°F | Ht 67.0 in | Wt 274.0 lb

## 2023-03-30 DIAGNOSIS — Z7984 Long term (current) use of oral hypoglycemic drugs: Secondary | ICD-10-CM

## 2023-03-30 DIAGNOSIS — Z794 Long term (current) use of insulin: Secondary | ICD-10-CM

## 2023-03-30 DIAGNOSIS — R072 Precordial pain: Secondary | ICD-10-CM

## 2023-03-30 DIAGNOSIS — Z7985 Long-term (current) use of injectable non-insulin antidiabetic drugs: Secondary | ICD-10-CM

## 2023-03-30 DIAGNOSIS — E118 Type 2 diabetes mellitus with unspecified complications: Secondary | ICD-10-CM | POA: Diagnosis not present

## 2023-03-30 DIAGNOSIS — E1165 Type 2 diabetes mellitus with hyperglycemia: Secondary | ICD-10-CM

## 2023-03-30 DIAGNOSIS — F1120 Opioid dependence, uncomplicated: Secondary | ICD-10-CM | POA: Diagnosis not present

## 2023-03-30 LAB — POCT GLYCOSYLATED HEMOGLOBIN (HGB A1C): HbA1c POC (<> result, manual entry): 9 % (ref 4.0–5.6)

## 2023-03-30 MED ORDER — HYDROCODONE-ACETAMINOPHEN 10-325 MG PO TABS: 1.0000 | ORAL_TABLET | Freq: Every day | ORAL | 0 refills | Status: DC

## 2023-03-30 NOTE — Progress Notes (Signed)
   Subjective:   Patient ID: John Rhodes, male    DOB: February 14, 1976, 47 y.o.   MRN: [id number]  HPI The patient is a 47 YO man coming in for opiate follow up and diabetes follow up with new/worsening precordial pain. When working outside will sweat and vomit and sometimes feel like he may pass out. Drinking plenty of fluids.   Review of Systems  Constitutional:  Positive for activity change and fatigue.  HENT: Negative.    Eyes: Negative.   Respiratory:  Negative for cough, chest tightness and shortness of breath.   Cardiovascular:  Negative for chest pain, palpitations and leg swelling.       Diaphoresis  Gastrointestinal:  Negative for abdominal distention, abdominal pain, constipation, diarrhea, nausea and vomiting.  Musculoskeletal:  Positive for arthralgias, back pain and myalgias.  Skin: Negative.   Neurological: Negative.   Psychiatric/Behavioral: Negative.      Objective:  Physical Exam Constitutional:      Appearance: He is well-developed. He is obese.  HENT:     Head: Normocephalic and atraumatic.  Cardiovascular:     Rate and Rhythm: Normal rate and regular rhythm.  Pulmonary:     Effort: Pulmonary effort is normal. No respiratory distress.     Breath sounds: Normal breath sounds. No wheezing or rales.  Abdominal:     General: Bowel sounds are normal. There is no distension.     Palpations: Abdomen is soft.     Tenderness: There is no abdominal tenderness. There is no rebound.  Musculoskeletal:        General: Tenderness present.     Cervical back: Normal range of motion.  Skin:    General: Skin is warm and dry.  Neurological:     Mental Status: He is alert and oriented to person, place, and time.     Coordination: Coordination normal.     Vitals:   03/30/23 1030 03/30/23 1033  BP: (!) 150/100 (!) 150/100  Pulse: (!) 128   Temp: 98.3 F (36.8 C)   TempSrc: Oral   SpO2: 96%   Weight: 274 lb (124.3 kg)   Height: 5\' 7"  (1.702 m)     Assessment &  Plan:

## 2023-03-30 NOTE — Assessment & Plan Note (Signed)
This is worsening/changing lately and sounds concerning for angina. BP moderately elevated today. He was asked to do EKG and was unable to do this due to transportation timing. He does agree to see cardiology. He is very high risk for CAD disease with family history of both parents with heart attack and death mid 28s, poorly controlled diabetes, morbid obesity. Referral to cardiology.

## 2023-03-30 NOTE — Assessment & Plan Note (Signed)
UDS up to date and appropriate. [place] database reviewed and appropriate today. Contract on file. Takes hydrocdone 10/325 5 per day and refilled today as due. He wants to assess heart first but now he has medicaid he is willing to go for assessment for other options to help back/neck.

## 2023-03-30 NOTE — Patient Instructions (Signed)
We will plan to get you in with the cardiologist.

## 2023-03-30 NOTE — Assessment & Plan Note (Signed)
POC HgA1c done today and is persistently poorly controlled. Previously 8.9 and today 9. He is taking glipizide 5 mg BID and metformin 1000 mg BID and lantus (did not know units today not checking sugars regularly). We had prescribed ozempic about 3 months ago and it appears the PA for this was approved in April but he was not notified and did not pick up. Advised to pick up and start this to help with control.

## 2023-04-02 ENCOUNTER — Ambulatory Visit: Payer: Medicaid Other | Admitting: Internal Medicine

## 2023-04-05 ENCOUNTER — Other Ambulatory Visit: Payer: Self-pay | Admitting: Internal Medicine

## 2023-04-25 ENCOUNTER — Telehealth: Payer: Self-pay | Admitting: Internal Medicine

## 2023-04-25 MED ORDER — HYDROCODONE-ACETAMINOPHEN 10-325 MG PO TABS: 1.0000 | ORAL_TABLET | Freq: Every day | ORAL | 0 refills | Status: DC

## 2023-04-25 NOTE — Telephone Encounter (Signed)
Sent in

## 2023-04-25 NOTE — Telephone Encounter (Signed)
Patient states that his pharmacy told them that the meds were called in but they are  closed on Sunday.  Patient states that pharmacist told him that if Dr. Okey Dupre would call to approve it it could be filled on Saturday the 8th.  Please call patient and let them know if this is ok.  Patient's number [phone]

## 2023-04-25 NOTE — Telephone Encounter (Signed)
Prescription Request  04/25/2023  LOV: 03/30/2023  What is the name of the medication or equipment?  HYDROcodone-acetaminophen (NORCO) 10-325 MG tablet    Have you contacted your pharmacy to request a refill? No   [facility] #[id number] [place], [place] - [address] AT [facility] [address] [place] [address] Phone: [phone] Fax: [phone]  [id number] [place] [place] - [address]  Patient notified that their request is being sent to the clinical staff for review and that they should receive a response within 2 business days.   Please advise at Mobile [phone] (mobile)

## 2023-04-27 ENCOUNTER — Telehealth: Payer: Self-pay

## 2023-04-27 NOTE — Telephone Encounter (Signed)
Ok for 8th

## 2023-04-27 NOTE — Telephone Encounter (Signed)
Documentation was an error

## 2023-04-30 NOTE — Telephone Encounter (Signed)
Contacted pharmacy and was unable to speak with a representative due to being on hold for a long period of time and also was still having to room patients.

## 2023-05-01 ENCOUNTER — Other Ambulatory Visit: Payer: Self-pay | Admitting: Internal Medicine

## 2023-05-04 ENCOUNTER — Other Ambulatory Visit: Payer: Self-pay | Admitting: Internal Medicine

## 2023-05-27 ENCOUNTER — Other Ambulatory Visit: Payer: Self-pay | Admitting: Internal Medicine

## 2023-05-28 ENCOUNTER — Telehealth: Payer: Self-pay | Admitting: Internal Medicine

## 2023-05-28 NOTE — Telephone Encounter (Signed)
Patient called back and said they are out of below medication.

## 2023-05-28 NOTE — Telephone Encounter (Signed)
Next OV is 06/27/2023.  Prescription Request  05/28/2023  LOV: 03/30/2023  What is the name of the medication or equipment? HYDROcodone-acetaminophen (NORCO) 10-325 MG tablet   Have you contacted your pharmacy to request a refill? No   Which pharmacy would you like this sent to?  [facility] #[id number] [place], [place] - [address] AT [facility] & [address] [place] [address] Phone: [phone] Fax: [phone]    Patient notified that their request is being sent to the clinical staff for review and that they should receive a response within 2 business days.   Please advise at Mobile [phone] (mobile)

## 2023-05-29 MED ORDER — HYDROCODONE-ACETAMINOPHEN 10-325 MG PO TABS: 1.0000 | ORAL_TABLET | Freq: Every day | ORAL | 0 refills | Status: DC

## 2023-05-29 NOTE — Telephone Encounter (Signed)
Pt requesting refill of norco. PDMP reviewed, pt due for refill.

## 2023-06-06 ENCOUNTER — Other Ambulatory Visit: Payer: Self-pay | Admitting: Internal Medicine

## 2023-06-20 ENCOUNTER — Other Ambulatory Visit: Payer: Self-pay | Admitting: Internal Medicine

## 2023-06-22 ENCOUNTER — Other Ambulatory Visit: Payer: Self-pay | Admitting: Internal Medicine

## 2023-06-27 ENCOUNTER — Ambulatory Visit (INDEPENDENT_AMBULATORY_CARE_PROVIDER_SITE_OTHER): Payer: Medicaid Other | Admitting: Internal Medicine

## 2023-06-27 ENCOUNTER — Encounter: Payer: Self-pay | Admitting: Internal Medicine

## 2023-06-27 VITALS — BP 138/110 | HR 115 | Temp 98.1°F | Ht 67.0 in | Wt 235.0 lb

## 2023-06-27 DIAGNOSIS — E1165 Type 2 diabetes mellitus with hyperglycemia: Secondary | ICD-10-CM | POA: Diagnosis not present

## 2023-06-27 DIAGNOSIS — F1120 Opioid dependence, uncomplicated: Secondary | ICD-10-CM | POA: Diagnosis not present

## 2023-06-27 DIAGNOSIS — R072 Precordial pain: Secondary | ICD-10-CM

## 2023-06-27 DIAGNOSIS — E118 Type 2 diabetes mellitus with unspecified complications: Secondary | ICD-10-CM

## 2023-06-27 DIAGNOSIS — Z7985 Long-term (current) use of injectable non-insulin antidiabetic drugs: Secondary | ICD-10-CM

## 2023-06-27 LAB — POCT GLYCOSYLATED HEMOGLOBIN (HGB A1C): HbA1c POC (<> result, manual entry): 9.7 % (ref 4.0–5.6)

## 2023-06-27 MED ORDER — HYDROCODONE-ACETAMINOPHEN 10-325 MG PO TABS: 1.0000 | ORAL_TABLET | Freq: Every day | ORAL | 0 refills | Status: DC
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
## 2023-06-27 NOTE — Patient Instructions (Signed)
We have refilled the hydrocodone and will get the pharmacist in touch with you about making sure the medications are right.
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
## 2023-06-28 NOTE — Assessment & Plan Note (Signed)
He is stable on current regimen which allows him to be functionable. He is using hydrocodone 10/325 5 times a day #150 per 30 days. He has signed pain contract and UDS up to date and appropriate. [place] database reviewed. Refill done of hydrocodone today as due.

## 2023-06-28 NOTE — Assessment & Plan Note (Signed)
He was referred to cardiology and then did not schedule due to transportation issues. He is not wanting another referral today. Still having chest pains and less often than before.

## 2023-07-03 ENCOUNTER — Other Ambulatory Visit: Payer: Self-pay | Admitting: Internal Medicine

## 2023-07-03 ENCOUNTER — Telehealth: Payer: Self-pay

## 2023-07-03 ENCOUNTER — Telehealth: Payer: Self-pay | Admitting: Internal Medicine

## 2023-07-03 NOTE — Telephone Encounter (Signed)
Please refill for patient.

## 2023-07-03 NOTE — Telephone Encounter (Signed)
Pt would also like a follow up on the wegovy?

## 2023-07-03 NOTE — Telephone Encounter (Signed)
The pharmacist will be talking to him and they will help [place] optimize his medications and co-pays

## 2023-07-03 NOTE — Progress Notes (Signed)
   Care Guide Note  07/03/2023 Name: TREDARIUS TENEYCK MRN: [id number] DOB: October 06, 1976  Referred by: Myrlene Broker, MD Reason for referral : Care Management (Outreach to schedule with pharm d )   HABEEB CHAGOLLA is a 46 y.o. year old male who is a primary care patient of Myrlene Broker, MD. Marjo Bicker was referred to the pharmacist for assistance related to DM.    Successful contact was made with the patient to discuss pharmacy services including being ready for the pharmacist to call at least 5 minutes before the scheduled appointment time, to have medication bottles and any blood sugar or blood pressure readings ready for review. The patient agreed to meet with the pharmacist via with the pharmacist via telephone visit on (date/time).  07/17/2023  Penne Lash, RMA Care Guide [facility]  [place], [place] [address] Direct Dial: [phone] [email or web address]

## 2023-07-03 NOTE — Telephone Encounter (Signed)
Patient also said he and Dr. Okey Dupre spoke about starting [facility]. He wanted to check the status of that.   Prescription Request  07/03/2023  LOV: 06/27/2023  What is the name of the medication or equipment? traZODone (DESYREL) 100 MG tablet   Have you contacted your pharmacy to request a refill? No   Which pharmacy would you like this sent to?  [id number] [place], [place] - [address] [place] [address] Phone: [phone] Fax: [phone]   Patient notified that their request is being sent to the clinical staff for review and that they should receive a response within 2 business days.   Please advise at Mobile [phone] (mobile)

## 2023-07-05 ENCOUNTER — Other Ambulatory Visit: Payer: Self-pay | Admitting: Internal Medicine

## 2023-07-06 ENCOUNTER — Other Ambulatory Visit: Payer: Self-pay | Admitting: Internal Medicine

## 2023-07-06 MED ORDER — TRAZODONE HCL 100 MG PO TABS: 300.0000 mg | ORAL_TABLET | Freq: Every day | ORAL | 3 refills | Status: DC

## 2023-07-06 NOTE — Telephone Encounter (Signed)
Patient called to find out why his trazadone was denied. He said he only has a day or two left of the medication. Best callback is [phone].

## 2023-07-06 NOTE — Telephone Encounter (Signed)
Called pt back inform him Trazodone sent to [facility]../l;mb

## 2023-07-17 ENCOUNTER — Other Ambulatory Visit: Payer: Medicaid Other

## 2023-07-17 NOTE — Progress Notes (Unsigned)
   07/18/2023 Name: John Rhodes MRN: [id number] DOB: [birth year]  Chief Complaint  Patient presents with   Medication Assistance   [name] is a 47 y.o. year old male who presented for a telephone visit.   They were referred to the pharmacist by their PCP for assistance in managing diabetes.    Subjective:  Care Team: Primary Care Provider: Myrlene Broker, MD   Medication Access/Adherence Current Pharmacy:  [facility] [address] ([address]), [place] - [address] [place] ([place]) [place] [address] Phone: [phone] Fax: [phone]  [id number] [place], [place] - [address] [place] [address] Phone: [phone] Fax: [phone]  Patient reports affordability concerns with their medications: Yes  Ozempic is not being covered by insurance Patient reports access/transportation concerns to their pharmacy: No  Patient reports adherence concerns with their medications:  Yes  has not been able to start Ozempic therapy  Diabetes: Current medications: Lantus 10-40 units daily, metformin 1000mg  BID, glipizide 5mg  BID -Patient has been prescribed Ozempic but has not been able to pick up due to cost; insurance is not covering -Discussed Ozempic versus Wegovy for DM control and weight loss  Objective: Lab Results  Component Value Date   HGBA1C 9.7 06/27/2023   Lab Results  Component Value Date   CREATININE 0.87 10/02/2022   BUN 13 10/02/2022   NA 137 10/02/2022   K 4.2 10/02/2022   CL 101 10/02/2022   CO2 26 10/02/2022   Assessment/Plan:   Diabetes: - Currently uncontrolled - I recommend Ozempic 0.25mg  weekly x4 weeks, increasing to 0.5mg  weekly x4 weeks if tolerated - Ozempic is indicated for T2DM, and patient will likely also see weight loss benefit from medication, especially as we continue to titrate dose on up to 1mg  and 2mg  - Patient has Comptroller through the exchange as well  as a managed medicaid plan -Attempted to start prior authorizations in CoverMyMeds for both insurance plans, but both came back stating PA is not required - Ozempic prescription pending to go to patient's preferred pharmacy for PCP to sign if in agreement.    Follow Up Plan: Once order is signed, I will contact pharmacy to verify insurance coverage and inform patient/provider  Lenna Gilford, PharmD, DPLA

## 2023-07-18 ENCOUNTER — Telehealth: Payer: Self-pay

## 2023-07-18 NOTE — Progress Notes (Signed)
   07/18/2023  Patient ID: John Rhodes, male   DOB: 1976-10-17, 47 y.o.   MRN: [id number]  [facility] Ozempic 0.25mg /0.5mg , so I contacted them to attempt to process claim.  Patient's primary insurance is covering, but Managed Medicaid is requiring a prior authorization.  PA has been sent to [facility] through CoverMyMeds.  I will track progress and notify patient/provider of the result.  Lenna Gilford, PharmD, DPLA

## 2023-07-18 NOTE — Progress Notes (Signed)
He was already prescribed ozempic so patient would need to contact pharmacy to fill

## 2023-07-18 NOTE — Progress Notes (Signed)
It just automatically expired in our system but if he has not filled pharmacy has it. I would like him to take

## 2023-07-19 ENCOUNTER — Telehealth: Payer: Self-pay

## 2023-07-19 NOTE — Progress Notes (Signed)
   07/19/2023  Patient ID: John Rhodes, male   DOB: 1976-03-17, 47 y.o.   MRN: [id number]  Patient outreach to follow-up on Ozempic prior authorization  -Prior Authorization for Ozempic 0.25/0.5mg  has been approved, and medication is going through for $4 at pharmacy -Informed patient and counseled on dosing and administration technique -Patient will use 0.25mg  weekly for 4 weeks, and then increase to 0.5mg  weekly if tolerating medication -I will follow-up with him the beginning of Oct to check on tolerance/efficacy to determine plan for increasing to 1mg  weekly  Lenna Gilford, PharmD, DPLA

## 2023-07-23 ENCOUNTER — Other Ambulatory Visit: Payer: Self-pay | Admitting: Internal Medicine

## 2023-07-25 ENCOUNTER — Telehealth: Payer: Self-pay | Admitting: Internal Medicine

## 2023-07-25 NOTE — Telephone Encounter (Signed)
Prescription Request  07/25/2023  LOV: 06/27/2023  What is the name of the medication or equipment? hydrocodone  Have you contacted your pharmacy to request a refill? Yes   Which pharmacy would you like this sent to?   [facility] [address] [place]  Patient notified that their request is being sent to the clinical staff for review and that they should receive a response within 2 business days.   Please advise at Mobile [phone] (mobile)

## 2023-07-25 NOTE — Telephone Encounter (Signed)
Please send in refill for patient

## 2023-07-26 MED ORDER — HYDROCODONE-ACETAMINOPHEN 10-325 MG PO TABS: 1.0000 | ORAL_TABLET | Freq: Every day | ORAL | 0 refills | Status: DC

## 2023-07-26 NOTE — Telephone Encounter (Signed)
Sent in, for future please ensure that requested pharmacy is listed so this one was not listed correctly.

## 2023-07-31 ENCOUNTER — Other Ambulatory Visit: Payer: Self-pay

## 2023-07-31 ENCOUNTER — Telehealth: Payer: Self-pay | Admitting: Internal Medicine

## 2023-07-31 NOTE — Telephone Encounter (Signed)
Please refill for patient.

## 2023-07-31 NOTE — Telephone Encounter (Signed)
Patient called and would like a med refill on insulin glargine (LANTUS SOLOSTAR) 100 UNIT/ML Solostar Pen  [facility] [address] [facility]

## 2023-08-01 ENCOUNTER — Telehealth: Payer: Self-pay | Admitting: Internal Medicine

## 2023-08-01 ENCOUNTER — Other Ambulatory Visit: Payer: Self-pay

## 2023-08-01 ENCOUNTER — Other Ambulatory Visit: Payer: Self-pay | Admitting: Internal Medicine

## 2023-08-01 MED ORDER — LANTUS SOLOSTAR 100 UNIT/ML ~~LOC~~ SOPN: PEN_INJECTOR | SUBCUTANEOUS | 5 refills | Status: DC

## 2023-08-01 NOTE — Telephone Encounter (Signed)
Pt stating that the pharmacy need PA for insulin glargine (LANTUS SOLOSTAR) 100 UNIT/ML Solostar Pen

## 2023-08-01 NOTE — Telephone Encounter (Signed)
Please clarify what dose units he is taking daily at this time and okay to refill at that dose

## 2023-08-01 NOTE — Telephone Encounter (Signed)
Patient is taking 40 units now instead of 30

## 2023-08-02 ENCOUNTER — Other Ambulatory Visit: Payer: Self-pay | Admitting: Internal Medicine

## 2023-08-02 ENCOUNTER — Other Ambulatory Visit: Payer: Self-pay

## 2023-08-02 ENCOUNTER — Other Ambulatory Visit (HOSPITAL_COMMUNITY): Payer: Self-pay

## 2023-08-02 DIAGNOSIS — E1165 Type 2 diabetes mellitus with hyperglycemia: Secondary | ICD-10-CM

## 2023-08-02 MED ORDER — LANTUS SOLOSTAR 100 UNIT/ML ~~LOC~~ SOPN
PEN_INJECTOR | SUBCUTANEOUS | 5 refills | Status: DC
Start: 2023-08-02 — End: 2023-09-20

## 2023-08-02 MED ORDER — LANTUS SOLOSTAR 100 UNIT/ML ~~LOC~~ SOPN: PEN_INJECTOR | SUBCUTANEOUS | 5 refills | Status: DC

## 2023-08-02 NOTE — Progress Notes (Signed)
Erx has been sent in.  

## 2023-08-02 NOTE — Telephone Encounter (Signed)
Per test claim, John Rhodes is covered and was filled recently. Please advise if PA is still required, thanks.

## 2023-08-02 NOTE — Telephone Encounter (Signed)
I have sent rx to another pharmacy due to [facility]

## 2023-08-22 ENCOUNTER — Telehealth: Payer: Self-pay | Admitting: Internal Medicine

## 2023-08-22 NOTE — Telephone Encounter (Signed)
Next OV is 09/24/2023.   Prescription Request  08/22/2023  LOV: 06/27/2023  What is the name of the medication or equipment?  HYDROcodone-acetaminophen (NORCO) 10-325 MG tablet   Have you contacted your pharmacy to request a refill? No   Which pharmacy would you like this sent to?  [id number] [place], [place] - [address] AT [facility] [address] [place] [address] Phone: [phone] Fax: [phone]    Patient notified that their request is being sent to the clinical staff for review and that they should receive a response within 2 business days.   Please advise at Mobile [phone] (mobile)

## 2023-08-23 ENCOUNTER — Other Ambulatory Visit: Payer: Self-pay

## 2023-08-23 MED ORDER — GABAPENTIN 300 MG PO CAPS: ORAL_CAPSULE | ORAL | 0 refills | Status: DC

## 2023-08-23 NOTE — Telephone Encounter (Signed)
Gabapentin has been refilled. 

## 2023-08-23 NOTE — Telephone Encounter (Signed)
Patient is also requesting his gabapentin be filled at the same time.

## 2023-08-24 MED ORDER — HYDROCODONE-ACETAMINOPHEN 10-325 MG PO TABS: 1.0000 | ORAL_TABLET | Freq: Every day | ORAL | 0 refills | Status: DC

## 2023-08-24 NOTE — Addendum Note (Signed)
Addended by: Hillard Danker A on: 08/24/2023 02:32 PM   Modules accepted: Orders

## 2023-08-24 NOTE — Telephone Encounter (Signed)
Refilled

## 2023-08-27 ENCOUNTER — Other Ambulatory Visit: Payer: Self-pay | Admitting: Pharmacist

## 2023-08-27 ENCOUNTER — Telehealth: Payer: Self-pay | Admitting: Pharmacist

## 2023-08-27 DIAGNOSIS — E1165 Type 2 diabetes mellitus with hyperglycemia: Secondary | ICD-10-CM

## 2023-08-27 MED ORDER — OZEMPIC (0.25 OR 0.5 MG/DOSE) 2 MG/1.5ML ~~LOC~~ SOPN: 0.5000 mg | PEN_INJECTOR | SUBCUTANEOUS | 0 refills | Status: DC

## 2023-08-27 NOTE — Progress Notes (Signed)
08/27/2023 Name: John Rhodes MRN: [id number] DOB: 1976-03-26  Chief Complaint  Patient presents with   Diabetes   Medication Management    John Rhodes is a 47 y.o. year old male who presented for a telephone visit follow up   They were referred to the pharmacist by their PCP for assistance in managing diabetes.    Subjective:  Care Team: Primary Care Provider: Myrlene Broker, MD ; Next Scheduled Visit: 09/24/2023  Medication Access/Adherence  Current Pharmacy:  [id number] [place], [place] - [address] [place] [address] Phone: [phone] Fax: [phone]  [facility][id number] [place], [place] - [address] AT [facility] [address] [place] [address] Phone: [phone] Fax: [phone]   Patient reports affordability concerns with their medications: No  Patient reports access/transportation concerns to their pharmacy: No  Patient reports adherence concerns with their medications:  No   *Pt asks for refill of gabapentin sent to [facility] [address]  Diabetes:  Current medications: Ozempic 0.5 mg weekly (currently on 2nd or 3rd 0.5 mg dose after 0.25 mg intro), Lantus 40 units daily, glipizide 5 mg twice daily, metformin 1000 mg twice daily Medications tried in the past:   Current glucose readings: staying around the 200s, occasionally down in the 100s  He reports no intolerable side effects with Ozempic. He states he has noticed he gets numbness in his hands and legs more often since starting Ozempic.   Objective:  Lab Results  Component Value Date   HGBA1C 9.7 06/27/2023    Lab Results  Component Value Date   CREATININE 0.87 10/02/2022   BUN 13 10/02/2022   NA 137 10/02/2022   K 4.2 10/02/2022   CL 101 10/02/2022   CO2 26 10/02/2022    Lab Results  Component Value Date   CHOL 179 10/02/2022   HDL 36.80 (L) 10/02/2022   LDLDIRECT  105.0 10/02/2022   TRIG 297.0 (H) 10/02/2022   CHOLHDL 5 10/02/2022    Medications Reviewed Today     Reviewed by Bonita Quin, RPH (Pharmacist) on 08/27/23 at 1528  Med List Status: <None>   Medication Order Taking? Sig Documenting Provider Last Dose Status Informant  ALPRAZolam (XANAX) 0.5 MG tablet [id number]  Take 1 tablet (0.5 mg total) by mouth 3 (three) times daily as needed for anxiety. This is a 30 day supply Myrlene Broker, MD  Active   blood glucose meter kit and supplies [id number]  Dispense based on patient and insurance preference. Use up to four times daily as directed. (FOR ICD-10 E10.9, E11.9). Myrlene Broker, MD  Active   gabapentin (NEURONTIN) 300 MG capsule [id number]  TAKE 1 TO 2 CAPSULES BY MOUTH THREE TIMES DAILY AS NEEDED. Myrlene Broker, MD  Active   glipiZIDE (GLUCOTROL) 5 MG tablet [id number] Yes TAKE 1 TABLET BY MOUTH TWICE A DAY BEFORE MEALS Myrlene Broker, MD Taking Active   HYDROcodone-acetaminophen [facility]) 10-325 MG tablet [id number]  Take 1 tablet by mouth 5 (five) times daily. Myrlene Broker, MD  Active   ibuprofen (ADVIL,MOTRIN) 600 MG tablet [id number]  Take 1 tablet (600 mg total) by mouth every 6 (six) hours as needed. Audry Pili, PA-C  Active   insulin glargine (LANTUS SOLOSTAR) 100 UNIT/ML Solostar Pen [id number] Yes 10-40 UNITS DAILY Strength: 100 UNIT/ML Myrlene Broker, MD Taking Active   lisinopril-hydrochlorothiazide (ZESTORETIC) 20-12.5 MG tablet  [id number]  Take 1 tablet by mouth daily. Myrlene Broker, MD  Active   metFORMIN (GLUCOPHAGE) 1000 MG tablet [id number] Yes TAKE 1 TABLET BY MOUTH TWICE A DAY WITH FOOD Myrlene Broker, MD Taking Active    Patient not taking:   Discontinued 08/27/23 1527 (Duplicate)   Semaglutide,0.25 or 0.5MG /DOS, (OZEMPIC, 0.25 OR 0.5 MG/DOSE,) 2 MG/1.5ML SOPN [id number] Yes Inject into the skin. 0.25mg  weekly for 4 weeks, then increase to 0.5mg  weekly [provider] Taking Active            Med Note Sabino Niemann A   Thu Jul 19, 2023 12:30 PM)    sertraline (ZOLOFT) 100 MG tablet [id number]  TAKE 2 TABLETS BY MOUTH EVERY DAY Myrlene Broker, MD  Active   simvastatin (ZOCOR) 40 MG tablet [id number]  Take 1 tablet (40 mg total) by mouth at bedtime. Myrlene Broker, MD  Active   traZODone (DESYREL) 100 MG tablet [id number]  Take 3 tablets (300 mg total) by mouth at bedtime. Myrlene Broker, MD  Active               Assessment/Plan:   Diabetes: - Currently uncontrolled, A1c goal <7% - Will do 1 more month on 0.5 mg then increase to 1 mg weekly if BG remain above goal - [facility]   Follow Up Plan: 11/1 at 2:30 PM phone follow up  Arbutus Leas, PharmD, BCPS [facility] [phone]

## 2023-08-27 NOTE — Telephone Encounter (Signed)
Called patient to follow up on diabetes/Ozempic. Patient called back, see encounter note 10/7

## 2023-08-27 NOTE — Patient Instructions (Signed)
It was a pleasure speaking with you today!  Continue Ozempic 0.5 mg once weekly. After another 4 weeks on this dose, we will plan to increase to 1 mg weekly if blood sugars stay elevated. I will plan to call you on 11/1 at 2:30 PM to check on your blood sugar readings and see how you're doing. Continue all other medications the same.  Arbutus Leas, PharmD, BCPS [facility] [phone]

## 2023-08-27 NOTE — Telephone Encounter (Signed)
While on telephone follow up, patient asks if gabapentin refill can be sent to [facility] on [address]. He states [facility] and he has had trouble getting it [facility]. Last refill was just sent 08/23/2023. Will send to PCP team to determine if refill can be sent to corrected pharmacy.  Arbutus Leas, PharmD, BCPS [facility] [phone]

## 2023-08-29 ENCOUNTER — Other Ambulatory Visit: Payer: Self-pay

## 2023-08-29 MED ORDER — GABAPENTIN 300 MG PO CAPS: ORAL_CAPSULE | ORAL | 0 refills | Status: DC

## 2023-08-29 NOTE — Telephone Encounter (Signed)
I have re sent the medication for patient to [facility] on [address]

## 2023-09-17 ENCOUNTER — Telehealth: Payer: Self-pay | Admitting: Internal Medicine

## 2023-09-17 NOTE — Telephone Encounter (Signed)
Prescription Request  09/17/2023  LOV: 06/27/2023  What is the name of the medication or equipment? insulin glargine (LANTUS SOLOSTAR) 100 UNIT/ML Solostar Pen   Have you contacted your pharmacy to request a refill? No   Which pharmacy would you like this sent to?     [facility][id number] - [place], [place] - [address] [place] [address] Phone: [phone] Fax: [phone]  Patient notified that their request is being sent to the clinical staff for review and that they should receive a response within 2 business days.   Please advise at Mobile [phone] (mobile)

## 2023-09-18 ENCOUNTER — Other Ambulatory Visit: Payer: Self-pay

## 2023-09-18 DIAGNOSIS — E1165 Type 2 diabetes mellitus with hyperglycemia: Secondary | ICD-10-CM

## 2023-09-20 ENCOUNTER — Other Ambulatory Visit: Payer: Self-pay

## 2023-09-20 DIAGNOSIS — E1165 Type 2 diabetes mellitus with hyperglycemia: Secondary | ICD-10-CM

## 2023-09-20 MED ORDER — LANTUS SOLOSTAR 100 UNIT/ML ~~LOC~~ SOPN: PEN_INJECTOR | SUBCUTANEOUS | 5 refills | Status: DC

## 2023-09-21 ENCOUNTER — Other Ambulatory Visit: Payer: Medicaid Other

## 2023-09-21 DIAGNOSIS — E1165 Type 2 diabetes mellitus with hyperglycemia: Secondary | ICD-10-CM

## 2023-09-21 DIAGNOSIS — E118 Type 2 diabetes mellitus with unspecified complications: Secondary | ICD-10-CM

## 2023-09-21 NOTE — Progress Notes (Unsigned)
   09/21/2023 Name: John Rhodes MRN: [id number] DOB: 1976-05-28  Chief Complaint  Patient presents with   Diabetes   Medication Management    John Rhodes is a 47 y.o. year old male who presented for a telephone visit follow up   They were referred to the pharmacist by their PCP for assistance in managing diabetes.    Subjective:  Care Team: Primary Care Provider: Myrlene Broker, MD ; Next Scheduled Visit: 09/24/2023  Medication Access/Adherence  Current Pharmacy:  [facility][id number] [place], [place] - [address] [place] [address] Phone: [phone] Fax: [phone]  [facility] #[id number] [place],  - [address] AT [facility] [address] [place] [address] Phone: [phone] Fax: [phone]   Patient reports affordability concerns with their medications: No  Patient reports access/transportation concerns to their pharmacy: No  Patient reports adherence concerns with their medications:  No     Diabetes:  Current medications: Ozempic 0.5 mg weekly (for about 1.5 months), Lantus 40 units daily, glipizide 5 mg twice daily, metformin 1000 mg twice daily Medications tried in the past:    Current glucose readings: staying around low 200s, high 100s  He reports no intolerable side effects with Ozempic. He has noticed he has more heartburn now, he is unsure if that is related to Ozempic. He states he has noticed he gets numbness in his hands and legs more often since starting Ozempic.   Objective:  Lab Results  Component Value Date   HGBA1C 9.7 06/27/2023    Lab Results  Component Value Date   CREATININE 0.87 10/02/2022   BUN 13 10/02/2022   NA 137 10/02/2022   K 4.2 10/02/2022   CL 101 10/02/2022   CO2 26 10/02/2022    Lab Results  Component Value Date   CHOL 179 10/02/2022   HDL 36.80 (L) 10/02/2022   LDLDIRECT 105.0 10/02/2022   TRIG 297.0  (H) 10/02/2022   CHOLHDL 5 10/02/2022    Medications Reviewed Today   Medications were not reviewed in this encounter       Assessment/Plan:   Diabetes: - Currently uncontrolled, A1c goal <7% - Recommend increasing Ozempic to 1 mg weekly  - Has PCP appt on Monday, he will discuss heart burn sx    Follow Up Plan: 12/2 at 2:30 PM phone follow up  Arbutus Leas, PharmD, BCPS [facility] [phone]

## 2023-09-24 ENCOUNTER — Ambulatory Visit (INDEPENDENT_AMBULATORY_CARE_PROVIDER_SITE_OTHER): Payer: Medicaid Other | Admitting: Internal Medicine

## 2023-09-24 ENCOUNTER — Encounter: Payer: Self-pay | Admitting: Internal Medicine

## 2023-09-24 VITALS — BP 140/86 | HR 112 | Temp 98.3°F | Ht 67.0 in | Wt 226.0 lb

## 2023-09-24 DIAGNOSIS — E118 Type 2 diabetes mellitus with unspecified complications: Secondary | ICD-10-CM | POA: Diagnosis not present

## 2023-09-24 DIAGNOSIS — E785 Hyperlipidemia, unspecified: Secondary | ICD-10-CM | POA: Diagnosis not present

## 2023-09-24 DIAGNOSIS — K219 Gastro-esophageal reflux disease without esophagitis: Secondary | ICD-10-CM

## 2023-09-24 DIAGNOSIS — E1169 Type 2 diabetes mellitus with other specified complication: Secondary | ICD-10-CM | POA: Diagnosis not present

## 2023-09-24 DIAGNOSIS — Z7985 Long-term (current) use of injectable non-insulin antidiabetic drugs: Secondary | ICD-10-CM | POA: Diagnosis not present

## 2023-09-24 DIAGNOSIS — E1165 Type 2 diabetes mellitus with hyperglycemia: Secondary | ICD-10-CM

## 2023-09-24 DIAGNOSIS — F1120 Opioid dependence, uncomplicated: Secondary | ICD-10-CM

## 2023-09-24 LAB — COMPREHENSIVE METABOLIC PANEL
ALT: 14 U/L (ref 0–53)
AST: 12 U/L (ref 0–37)
Albumin: 4.5 g/dL (ref 3.5–5.2)
Alkaline Phosphatase: 66 U/L (ref 39–117)
BUN: 14 mg/dL (ref 6–23)
CO2: 30 meq/L (ref 19–32)
Calcium: 9.6 mg/dL (ref 8.4–10.5)
Chloride: 94 meq/L — ABNORMAL LOW (ref 96–112)
Creatinine, Ser: 0.96 mg/dL (ref 0.40–1.50)
GFR: 92.68 mL/min (ref >60.00)
Glucose, Bld: 389 mg/dL — ABNORMAL HIGH (ref 70–99)
Potassium: 4.7 meq/L (ref 3.5–5.1)
Sodium: 133 meq/L — ABNORMAL LOW (ref 135–145)
Total Bilirubin: 0.3 mg/dL (ref 0.2–1.2)
Total Protein: 8.1 g/dL (ref 6.0–8.3)

## 2023-09-24 LAB — CBC
HCT: 45.2 % (ref 39.0–52.0)
Hemoglobin: 15 g/dL (ref 13.0–17.0)
MCHC: 33.1 g/dL (ref 30.0–36.0)
MCV: 88.3 fL (ref 78.0–100.0)
Platelets: 230 10*3/uL (ref 150.0–400.0)
RBC: 5.11 Mil/uL (ref 4.22–5.81)
RDW: 13.3 % (ref 11.5–15.5)
WBC: 11.6 10*3/uL — ABNORMAL HIGH (ref 4.0–10.5)

## 2023-09-24 LAB — LIPID PANEL
Cholesterol: 177 mg/dL (ref 0–200)
HDL: 39.8 mg/dL (ref >39.00)
LDL Cholesterol: 69 mg/dL (ref 0–99)
NonHDL: 137.61
Total CHOL/HDL Ratio: 4
Triglycerides: 342 mg/dL — ABNORMAL HIGH (ref 0.0–149.0)
VLDL: 68.4 mg/dL — ABNORMAL HIGH (ref 0.0–40.0)

## 2023-09-24 LAB — HEMOGLOBIN A1C: Hgb A1c MFr Bld: 7.6 % — ABNORMAL HIGH (ref 4.6–6.5)

## 2023-09-24 MED ORDER — PANTOPRAZOLE SODIUM 40 MG PO TBEC: 40.0000 mg | DELAYED_RELEASE_TABLET | Freq: Every day | ORAL | 3 refills | Status: AC

## 2023-09-24 MED ORDER — SEMAGLUTIDE (1 MG/DOSE) 4 MG/3ML ~~LOC~~ SOPN
1.0000 mg | PEN_INJECTOR | SUBCUTANEOUS | 2 refills | Status: DC
Start: 2023-09-24 — End: 2023-10-22

## 2023-09-24 MED ORDER — HYDROCODONE-ACETAMINOPHEN 10-325 MG PO TABS: 1.0000 | ORAL_TABLET | Freq: Every day | ORAL | 0 refills | Status: DC

## 2023-09-24 NOTE — Patient Instructions (Signed)
We have sent in protonix to help the stomach

## 2023-09-24 NOTE — Patient Instructions (Signed)
It was a pleasure speaking with you today!  Increase Ozempic to 1 mg once weekly after completing your 0.5 mg supply. I will follow up in 1 month to see how you are tolerating it.  Feel free to call with any questions or concerns!  Arbutus Leas, PharmD, BCPS [facility] Clinical Pharmacist [facility] [phone]

## 2023-09-24 NOTE — Progress Notes (Unsigned)
   Subjective:   Patient ID: John Rhodes, male    DOB: 1976/06/08, 47 y.o.   MRN: [id number]  HPI The patient is a 47 YO man coming in for follow up chronic pain and diabetes. Is taking ozempic and switching to 1 mg weekly and doing okay. Some more GERD. Pain is slightly worse due to cold weather otherwise stable.   Review of Systems  Constitutional: Negative.   HENT: Negative.    Eyes: Negative.   Respiratory:  Negative for cough, chest tightness and shortness of breath.   Cardiovascular:  Negative for chest pain, palpitations and leg swelling.  Gastrointestinal:  Negative for abdominal distention, abdominal pain, constipation, diarrhea, nausea and vomiting.       GERD  Musculoskeletal:  Positive for arthralgias and back pain.  Skin: Negative.   Neurological: Negative.   Psychiatric/Behavioral: Negative.      Objective:  Physical Exam Constitutional:      Appearance: He is well-developed.  HENT:     Head: Normocephalic and atraumatic.  Cardiovascular:     Rate and Rhythm: Normal rate and regular rhythm.  Pulmonary:     Effort: Pulmonary effort is normal. No respiratory distress.     Breath sounds: Normal breath sounds. No wheezing or rales.  Abdominal:     General: Bowel sounds are normal. There is no distension.     Palpations: Abdomen is soft.     Tenderness: There is no abdominal tenderness. There is no rebound.  Musculoskeletal:     Cervical back: Normal range of motion.  Skin:    General: Skin is warm and dry.  Neurological:     Mental Status: He is alert and oriented to person, place, and time.     Coordination: Coordination normal.     Vitals:   09/24/23 1417  BP: (!) 140/86  Pulse: (!) 112  Temp: 98.3 F (36.8 C)  TempSrc: Oral  SpO2: 96%  Weight: 226 lb (102.5 kg)  Height: 5\' 7"  (1.702 m)    Assessment & Plan:

## 2023-09-25 DIAGNOSIS — K219 Gastro-esophageal reflux disease without esophagitis: Secondary | ICD-10-CM | POA: Insufficient documentation

## 2023-09-25 NOTE — Assessment & Plan Note (Signed)
PDMP reviewed and appropriate. UDS up to date. Contract on file and will continue hydrocodone/apap 10/325 #150 per month. Refilled at visit.

## 2023-09-25 NOTE — Assessment & Plan Note (Signed)
New with ozempic treatment. He is losing weight so will add protonix 40 mg daily for GERD and continue ozempic.

## 2023-09-25 NOTE — Assessment & Plan Note (Signed)
Checking lipid panel and he is taking simvastatin 40 mg daily now and using ozempic to lose weight. Adjust as needed for LDL <70 goal.

## 2023-09-25 NOTE — Assessment & Plan Note (Signed)
Checking HgA1c and he is taking ozempic 1 mg weekly (just started this dosing) and adjust as needed. He is losing weight. Also taking glipizide 5 mg BID and metformin 1000 mg BID and lantus 10 units daily. Is on ACE-I and statin.

## 2023-10-03 ENCOUNTER — Other Ambulatory Visit: Payer: Self-pay | Admitting: Internal Medicine

## 2023-10-16 ENCOUNTER — Telehealth: Payer: Self-pay | Admitting: Internal Medicine

## 2023-10-16 NOTE — Telephone Encounter (Signed)
Patient called and said that he needs nausea medication called in.  Dr. Okey Dupre discussed this with his wife Toni Amend this morning.  Please send to [facility] [address] and let patient know when this has been sent in.

## 2023-10-17 NOTE — Telephone Encounter (Signed)
Pt states that he believes its the ozempic causing him to be nauseous it will start the 1 or 2nd day after taking it and then will ease up later on the 3 or 4th day

## 2023-10-22 ENCOUNTER — Telehealth: Payer: Self-pay | Admitting: Pharmacist

## 2023-10-22 ENCOUNTER — Other Ambulatory Visit (HOSPITAL_COMMUNITY): Payer: Self-pay

## 2023-10-22 ENCOUNTER — Other Ambulatory Visit: Payer: Medicaid Other | Admitting: Pharmacist

## 2023-10-22 ENCOUNTER — Telehealth: Payer: Self-pay | Admitting: Internal Medicine

## 2023-10-22 DIAGNOSIS — E118 Type 2 diabetes mellitus with unspecified complications: Secondary | ICD-10-CM

## 2023-10-22 DIAGNOSIS — E1165 Type 2 diabetes mellitus with hyperglycemia: Secondary | ICD-10-CM

## 2023-10-22 MED ORDER — HYDROCODONE-ACETAMINOPHEN 10-325 MG PO TABS: 1.0000 | ORAL_TABLET | Freq: Every day | ORAL | 0 refills | Status: DC

## 2023-10-22 MED ORDER — ONDANSETRON HCL 4 MG PO TABS: 4.0000 mg | ORAL_TABLET | Freq: Three times a day (TID) | ORAL | 0 refills | Status: AC | PRN

## 2023-10-22 MED ORDER — SEMAGLUTIDE (2 MG/DOSE) 8 MG/3ML ~~LOC~~ SOPN: 2.0000 mg | PEN_INJECTOR | SUBCUTANEOUS | 0 refills | Status: DC

## 2023-10-22 NOTE — Telephone Encounter (Signed)
Sent in zofran to try

## 2023-10-22 NOTE — Telephone Encounter (Signed)
Prescription Request  10/22/2023  LOV: 09/24/2023  What is the name of the medication or equipment? hydrocodone  Have you contacted your pharmacy to request a refill? Yes   Which pharmacy would you like this sent to?  [facility] #[id number] - [place], [place] - [address] [place] [address] Phone: [phone] Fax: [phone]     Patient notified that their request is being sent to the clinical staff for review and that they should receive a response within 2 business days.   Please advise at Mobile [phone] (mobile)

## 2023-10-22 NOTE — Patient Instructions (Signed)
It was a pleasure speaking with you today!  Increase Ozempic to 2 mg once weekly after 4 weeks of the 1 mg dose.   Dr. Okey Dupre has sent in ondansetron to use as needed for nausea. Also be sure to focus on eating slowly and smaller portions as well as avoid fried/high fat foods to try to reduce risk of nausea.  Feel free to call with any questions or concerns!  Arbutus Leas, PharmD, BCPS [facility] Clinical Pharmacist [facility] [phone]

## 2023-10-22 NOTE — Telephone Encounter (Signed)
Pharmacy Patient Advocate Encounter   Received notification from CoverMyMeds that prior authorization for Ozempic (2 MG/DOSE) 8MG /3ML pen-injectors is required/requested.   Insurance verification completed.   The patient is insured through  [employer]  .   Per test claim: PA required; PA submitted to above mentioned insurance via CoverMyMeds Key/confirmation #/EOC [id number] Status is pending

## 2023-10-22 NOTE — Telephone Encounter (Signed)
Sent in

## 2023-10-22 NOTE — Progress Notes (Signed)
10/22/2023 Name: John Rhodes MRN: [id number] DOB: [birth year]  Chief Complaint  Patient presents with   Diabetes   Medication Management    MAXTEN PURI is a 47 y.o. year old male who presented for a telephone visit follow up   They were referred to the pharmacist by their PCP for assistance in managing diabetes.    Subjective:  Care Team: Primary Care Provider: Myrlene Broker, MD ; Next Scheduled Visit: 09/24/2023  Medication Access/Adherence  Current Pharmacy:  [facility] #[id number] [place], [place] - [address] AT [address] [place] [address] Phone: [phone] Fax: [phone]  [facility] #[id number] [place], [place] - [address] AT [facility] [address] [place] [address] Phone: [phone] Fax: [phone]   Patient reports affordability concerns with their medications: No  Patient reports access/transportation concerns to their pharmacy: No  Patient reports adherence concerns with their medications:  No    Diabetes:  Current medications: Ozempic 1 mg weekly (for about 1 month), Lantus 40 units daily, glipizide 5 mg twice daily, metformin 1000 mg twice daily Medications tried in the past:    Current glucose readings: high 100s  He reports nausea since increasing to 1 mg of Ozempic and asks for antinausea medication. Pt notes he still wants to try the 2 mg dose of Ozempic. He previously reports heartburn for which he takes pantoprazole now which he notes has helped.   Objective:  Lab Results  Component Value Date   HGBA1C 7.6 Repeated and verified X2. (H) 09/24/2023    Lab Results  Component Value Date   CREATININE 0.96 09/24/2023   BUN 14 09/24/2023   NA 133 (L) 09/24/2023   K 4.7 09/24/2023   CL 94 (L) 09/24/2023   CO2 30 09/24/2023    Lab Results  Component Value Date   CHOL 177 09/24/2023   HDL 39.80 09/24/2023   LDLCALC 69 09/24/2023   LDLDIRECT  105.0 10/02/2022   TRIG 342.0 (H) 09/24/2023   CHOLHDL 4 09/24/2023    Medications Reviewed Today     Reviewed by Bonita Quin, RPH (Pharmacist) on 10/22/23 at 1449  Med List Status: <None>   Medication Order Taking? Sig Documenting Provider Last Dose Status Informant  ALPRAZolam (XANAX) 0.5 MG tablet [id number]  Take 1 tablet (0.5 mg total) by mouth 3 (three) times daily as needed for anxiety. This is a 30 day supply Myrlene Broker, MD  Active   blood glucose meter kit and supplies [id number]  Dispense based on patient and insurance preference. Use up to four times daily as directed. (FOR ICD-10 E10.9, E11.9). Myrlene Broker, MD  Active   gabapentin (NEURONTIN) 300 MG capsule [id number]  TAKE 1 TO 2 CAPSULES BY MOUTH THREE TIMES DAILY AS NEEDED Myrlene Broker, MD  Active   glipiZIDE (GLUCOTROL) 5 MG tablet [id number]  TAKE 1 TABLET BY MOUTH TWICE A DAY BEFORE MEALS Myrlene Broker, MD  Active   HYDROcodone-acetaminophen [facility]) 10-325 MG tablet [id number]  Take 1 tablet by mouth 5 (five) times daily. Myrlene Broker, MD  Active   ibuprofen (ADVIL,MOTRIN) 600 MG tablet [id number]  Take 1 tablet (600 mg total) by mouth every 6 (six) hours as needed. Audry Pili, PA-C  Active   insulin glargine (LANTUS SOLOSTAR) 100 UNIT/ML Solostar Pen [id number]  10-40 UNITS DAILY Strength: 100 UNIT/ML Myrlene Broker, MD  Active   lisinopril-hydrochlorothiazide (ZESTORETIC) 20-12.5  MG tablet [id number]  Take 1 tablet by mouth daily. Myrlene Broker, MD  Active   metFORMIN (GLUCOPHAGE) 1000 MG tablet [id number]  TAKE 1 TABLET BY MOUTH TWICE A DAY WITH FOOD Myrlene Broker, MD  Active   pantoprazole (PROTONIX) 40 MG tablet [id number] Yes Take 1 tablet (40 mg total) by mouth daily. Myrlene Broker, MD Taking Active   Semaglutide, 1 MG/DOSE, 4 MG/3ML Namon Cirri [id number] Yes Inject 1 mg as directed once a week. Myrlene Broker, MD Taking Active   sertraline  (ZOLOFT) 100 MG tablet [id number]  TAKE 2 TABLETS BY MOUTH EVERY DAY Myrlene Broker, MD  Active   simvastatin (ZOCOR) 40 MG tablet [id number]  Take 1 tablet (40 mg total) by mouth at bedtime. Myrlene Broker, MD  Active   traZODone (DESYREL) 100 MG tablet [id number]  Take 3 tablets (300 mg total) by mouth at bedtime. Myrlene Broker, MD  Active               Assessment/Plan:   Diabetes: - Currently uncontrolled, A1c goal <7% - Recommend increasing Ozempic to 2 mg weekly  - recommend Zofran PRN for nausea. Reviewed nonpharm couseling to prevent nausea such as avoiding high fat foods (fried foods) and eating slowly/smaller portions   HTN: BP at last appt was uncontrolled. BP goal <130/80. Recommended pt check BP at home. - Consider increasing lisinopril or hydrochlorothiazide in his combo pill.    Follow Up Plan: 12/30 phone follow up  Arbutus Leas, PharmD, BCPS [facility] [phone]

## 2023-10-23 ENCOUNTER — Other Ambulatory Visit (HOSPITAL_COMMUNITY): Payer: Self-pay

## 2023-10-23 NOTE — Telephone Encounter (Signed)
Called patient and LVM.

## 2023-10-23 NOTE — Telephone Encounter (Signed)
Pharmacy Patient Advocate Encounter  Received notification from  [employer]  that Prior Authorization for Ozempic (2 MG/DOSE) 8MG /3ML pen-injectors has been APPROVED from 10/22/2023 to 10/21/2024   PA #/Case ID/Reference #: [id number]

## 2023-11-02 ENCOUNTER — Telehealth: Payer: Self-pay | Admitting: Internal Medicine

## 2023-11-02 NOTE — Telephone Encounter (Signed)
Prescription Request  11/02/2023  LOV: 09/24/2023  What is the name of the medication or equipment? traZODone (DESYREL) 100 MG tablet   Have you contacted your pharmacy to request a refill? No   Which pharmacy would you like this sent to? [facility] #[id number] - [place], [place] - [address] [place] [address] Phone: [phone] Fax: [phone]  Patient notified that their request is being sent to the clinical staff for review and that they should receive a response within 2 business days.   Please advise at Mobile [phone] (mobile)

## 2023-11-05 MED ORDER — TRAZODONE HCL 100 MG PO TABS: 300.0000 mg | ORAL_TABLET | Freq: Every day | ORAL | 6 refills | Status: DC

## 2023-11-05 NOTE — Telephone Encounter (Signed)
Sent in

## 2023-11-19 ENCOUNTER — Other Ambulatory Visit (INDEPENDENT_AMBULATORY_CARE_PROVIDER_SITE_OTHER): Payer: Medicaid Other | Admitting: Pharmacist

## 2023-11-19 DIAGNOSIS — Z7984 Long term (current) use of oral hypoglycemic drugs: Secondary | ICD-10-CM

## 2023-11-19 DIAGNOSIS — Z794 Long term (current) use of insulin: Secondary | ICD-10-CM

## 2023-11-19 DIAGNOSIS — E1165 Type 2 diabetes mellitus with hyperglycemia: Secondary | ICD-10-CM

## 2023-11-19 DIAGNOSIS — E118 Type 2 diabetes mellitus with unspecified complications: Secondary | ICD-10-CM

## 2023-11-19 DIAGNOSIS — Z7985 Long-term (current) use of injectable non-insulin antidiabetic drugs: Secondary | ICD-10-CM

## 2023-11-19 NOTE — Progress Notes (Signed)
   11/19/2023 Name: John Rhodes MRN: [id number] DOB: 1976/02/02  Chief Complaint  Patient presents with   Diabetes   Medication Management    John Rhodes is a 47 y.o. year old male who presented for a telephone visit follow up   They were referred to the pharmacist by their PCP for assistance in managing diabetes.    Subjective:  Care Team: Primary Care Provider: Myrlene Broker, MD ; Next Scheduled Visit: 09/24/2023  Medication Access/Adherence  Current Pharmacy:  [facility] #[id number] [place], [place] - [address] [place] [address] Phone: [phone] Fax: [phone]  [facility] #[id number] [place], [place] - [address] AT [facility] OF [facility] [address] [place] [address] Phone: [phone] Fax: [phone]   Patient reports affordability concerns with their medications: No  Patient reports access/transportation concerns to their pharmacy: No  Patient reports adherence concerns with their medications:  No    Diabetes:  Current medications: Ozempic 1 mg weekly (for about 1 month), Lantus 40 units daily, glipizide 5 mg twice daily, metformin 1000 mg twice daily Medications tried in the past:    Current glucose readings: high 100s  Reports some nausea that did not worsen with Ozempic 2 mg dose. Has Zofran to use PRN which he notes does help. He previously reports heartburn for which he takes pantoprazole now which he notes has helped.   Objective:  Lab Results  Component Value Date   HGBA1C 7.6 Repeated and verified X2. (H) 09/24/2023    Lab Results  Component Value Date   CREATININE 0.96 09/24/2023   BUN 14 09/24/2023   NA 133 (L) 09/24/2023   K 4.7 09/24/2023   CL 94 (L) 09/24/2023   CO2 30 09/24/2023    Lab Results  Component Value Date   CHOL 177 09/24/2023   HDL 39.80 09/24/2023   LDLCALC 69 09/24/2023   LDLDIRECT 105.0 10/02/2022   TRIG 342.0 (H)  09/24/2023   CHOLHDL 4 09/24/2023    Medications Reviewed Today   Medications were not reviewed in this encounter       Assessment/Plan:   Diabetes: - Currently uncontrolled, A1c goal <7% - Recommend to continue current regimen. - Reviewed monitoring for hypoglycemia symptoms - Wait for updated A1c in February   HTN: BP at last appt was uncontrolled. BP goal <130/80. Recommended pt check BP at home. - Consider increasing lisinopril or hydrochlorothiazide in his combo pill.    Follow Up Plan: February check in  Arbutus Leas, PharmD, BCPS [facility] [phone]

## 2023-11-19 NOTE — Patient Instructions (Signed)
 It was a pleasure speaking with you today!    Feel free to call with any questions or concerns!  Arbutus Leas, PharmD, BCPS [facility] Clinical Pharmacist [facility] [phone]

## 2023-11-22 ENCOUNTER — Other Ambulatory Visit: Payer: Self-pay | Admitting: Internal Medicine

## 2023-11-22 MED ORDER — HYDROCODONE-ACETAMINOPHEN 10-325 MG PO TABS: 1.0000 | ORAL_TABLET | Freq: Every day | ORAL | 0 refills | Status: DC

## 2023-11-22 NOTE — Telephone Encounter (Signed)
 Copied from CRM [id number]. Topic: Clinical - Medication Refill >> Nov 22, 2023 10:09 AM Isabell A wrote: Most Recent Primary Care Visit:  Provider: MERCEDA FILE R  Department: [facility]  Visit Type: PATIENT OUTREACH 30  Date: 10/22/2023  Medication: HYDROcodone -acetaminophen  (NORCO) 10-325 MG tablet   Has the patient contacted their pharmacy? Yes (Agent: If no, request that the patient contact the pharmacy for the refill. If patient does not wish to contact the pharmacy document the reason why and proceed with request.) (Agent: If yes, when and what did the pharmacy advise?) Contact provider   Is this the correct pharmacy for this prescription? Yes If no, delete pharmacy and type the correct one.  This is the patient's preferred pharmacy:   [facility] [address] [place], [place], [place] [address]   Has the prescription been filled recently? Yes  Is the patient out of the medication? No  Has the patient been seen for an appointment in the last year OR does the patient have an upcoming appointment? Yes  Can we respond through MyChart? Yes  Agent: Please be advised that Rx refills may take up to 3 business days. We ask that you follow-up with your pharmacy.

## 2023-11-29 ENCOUNTER — Other Ambulatory Visit: Payer: Self-pay | Admitting: Internal Medicine

## 2023-12-18 ENCOUNTER — Other Ambulatory Visit: Payer: Self-pay | Admitting: Internal Medicine

## 2023-12-18 DIAGNOSIS — E1165 Type 2 diabetes mellitus with hyperglycemia: Secondary | ICD-10-CM

## 2023-12-23 ENCOUNTER — Other Ambulatory Visit: Payer: Self-pay | Admitting: Internal Medicine

## 2023-12-24 ENCOUNTER — Encounter: Payer: Self-pay | Admitting: Internal Medicine

## 2023-12-24 ENCOUNTER — Ambulatory Visit (INDEPENDENT_AMBULATORY_CARE_PROVIDER_SITE_OTHER): Payer: Medicaid Other | Admitting: Internal Medicine

## 2023-12-24 VITALS — BP 140/100 | HR 106 | Temp 98.4°F | Ht 67.0 in | Wt 216.0 lb

## 2023-12-24 DIAGNOSIS — Z7984 Long term (current) use of oral hypoglycemic drugs: Secondary | ICD-10-CM

## 2023-12-24 DIAGNOSIS — G8929 Other chronic pain: Secondary | ICD-10-CM

## 2023-12-24 DIAGNOSIS — M549 Dorsalgia, unspecified: Secondary | ICD-10-CM | POA: Diagnosis not present

## 2023-12-24 DIAGNOSIS — I10 Essential (primary) hypertension: Secondary | ICD-10-CM

## 2023-12-24 DIAGNOSIS — E118 Type 2 diabetes mellitus with unspecified complications: Secondary | ICD-10-CM | POA: Diagnosis not present

## 2023-12-24 DIAGNOSIS — F1120 Opioid dependence, uncomplicated: Secondary | ICD-10-CM | POA: Diagnosis not present

## 2023-12-24 LAB — POCT GLYCOSYLATED HEMOGLOBIN (HGB A1C): HbA1c POC (<> result, manual entry): 6.7 % (ref 4.0–5.6)

## 2023-12-24 MED ORDER — HYDROCODONE-ACETAMINOPHEN 10-325 MG PO TABS: 1.0000 | ORAL_TABLET | Freq: Every day | ORAL | 0 refills | Status: DC

## 2023-12-24 NOTE — Progress Notes (Signed)
   Subjective:   Patient ID: John Rhodes, male    DOB: 1976/03/25, 48 y.o.   MRN: [id number]  HPI The patient is a 48 YO man coming in for pain and diabetes follow up. Still taking ozempic and losing weight. Some episodes of dizziness with doing things did not check sugar at the time.   Review of Systems  Constitutional: Negative.   HENT: Negative.    Eyes: Negative.   Respiratory:  Negative for cough, chest tightness and shortness of breath.   Cardiovascular:  Negative for chest pain, palpitations and leg swelling.  Gastrointestinal:  Negative for abdominal distention, abdominal pain, constipation, diarrhea, nausea and vomiting.  Musculoskeletal:  Positive for arthralgias, back pain and neck pain.  Skin: Negative.   Neurological: Negative.   Psychiatric/Behavioral: Negative.      Objective:  Physical Exam Constitutional:      Appearance: He is well-developed.  HENT:     Head: Normocephalic and atraumatic.  Cardiovascular:     Rate and Rhythm: Normal rate and regular rhythm.  Pulmonary:     Effort: Pulmonary effort is normal. No respiratory distress.     Breath sounds: Normal breath sounds. No wheezing or rales.  Abdominal:     General: Bowel sounds are normal. There is no distension.     Palpations: Abdomen is soft.     Tenderness: There is no abdominal tenderness. There is no rebound.  Musculoskeletal:     Cervical back: Normal range of motion.  Skin:    General: Skin is warm and dry.  Neurological:     Mental Status: He is alert and oriented to person, place, and time.     Coordination: Coordination normal.     Vitals:   12/24/23 1313 12/24/23 1317  BP: (!) 140/100 (!) 140/100  Pulse: (!) 106   Temp: 98.4 F (36.9 C)   TempSrc: Oral   SpO2: 98%   Weight: 216 lb (98 kg)   Height: 5\' 7"  (1.702 m)     Assessment & Plan:

## 2023-12-24 NOTE — Patient Instructions (Signed)
We will stop the lantus and keep other medicines the same.

## 2023-12-24 NOTE — Assessment & Plan Note (Signed)
Doing very well on ozempic 2 mg weekly and weight is continuing to decrease. POC Hga1c done and at goal 6.7 today. Given episodes of dizziness with exertion will stop lantus and have removed from list. Follow up 3 months and depending on results glipizide would be next medicine we would stop if able. Keep metformin, ozempic and glipizide for now.

## 2023-12-24 NOTE — Assessment & Plan Note (Signed)
Stable and continues on hydrocodone 10/325 mg 5 times a day with #150 per month. Refilled today to his pharmacy and [place] database reviewed.

## 2023-12-24 NOTE — Assessment & Plan Note (Signed)
BP moderately elevated today due to not taking medication due to taking friend to ER. He typically is at goal on lisinopril/hydrochlorothiazide 20/12.5 mg daily and will observe closely.

## 2023-12-24 NOTE — Assessment & Plan Note (Signed)
Weight improvement has not helped his chronic back and neck pain. He still remains on hydrocodone 10/325 5 times a day.

## 2024-01-04 ENCOUNTER — Other Ambulatory Visit (HOSPITAL_COMMUNITY): Payer: Self-pay

## 2024-01-10 ENCOUNTER — Other Ambulatory Visit (HOSPITAL_COMMUNITY): Payer: Self-pay

## 2024-01-10 ENCOUNTER — Other Ambulatory Visit: Payer: Self-pay | Admitting: Internal Medicine

## 2024-01-21 ENCOUNTER — Other Ambulatory Visit: Payer: Medicaid Other

## 2024-01-22 ENCOUNTER — Other Ambulatory Visit: Payer: Self-pay | Admitting: Internal Medicine

## 2024-01-22 MED ORDER — HYDROCODONE-ACETAMINOPHEN 10-325 MG PO TABS: 1.0000 | ORAL_TABLET | Freq: Every day | ORAL | 0 refills | Status: DC

## 2024-01-22 NOTE — Telephone Encounter (Signed)
 Copied from CRM [id number]. Topic: Clinical - Medication Refill >> Jan 22, 2024  2:32 PM Denese Killings wrote: Most Recent Primary Care Visit:  Provider: Hillard Danker A  Department: [facility]  Visit Type: OFFICE VISIT  Date: 12/24/2023  Medication: HYDROcodone-acetaminophen (NORCO) 10-325 MG tablet   Has the patient contacted their pharmacy? No (Agent: If no, request that the patient contact the pharmacy for the refill. If patient does not wish to contact the pharmacy document the reason why and proceed with request.) (Agent: If yes, when and what did the pharmacy advise?) always advised to call doctor  Is this the correct pharmacy for this prescription? Yes If no, delete pharmacy and type the correct one.  This is the patient's preferred pharmacy:  [facility] [address], [place] - [address] [place] [address] Phone: [phone] Fax: [phone]  Has the prescription been filled recently? Yes  Is the patient out of the medication? Yes will be out tomorrow  Has the patient been seen for an appointment in the last year OR does the patient have an upcoming appointment? Yes  Can we respond through MyChart? Yes  Agent: Please be advised that Rx refills may take up to 3 business days. We ask that you follow-up with your pharmacy.

## 2024-01-22 NOTE — Telephone Encounter (Signed)
 Last Fill: 12/24/23 150 tab/0 RF   Last OV: 12/24/23 Next OV: None Scheduled  Routing to provider for review/authorization.

## 2024-01-23 ENCOUNTER — Telehealth: Payer: Self-pay

## 2024-01-23 ENCOUNTER — Telehealth: Payer: Self-pay | Admitting: Internal Medicine

## 2024-01-23 ENCOUNTER — Other Ambulatory Visit (HOSPITAL_COMMUNITY): Payer: Self-pay

## 2024-01-23 NOTE — Telephone Encounter (Signed)
 Routing to clinic for review: please see note below  Copied from CRM [id number]. Topic: Clinical - Prescription Issue >> Jan 23, 2024 11:18 AM Denese Killings wrote: Reason for CRM: Katheran James. With [employer] is calling to give prior Authorization for patient HYDROcodone-acetaminophen (NORCO) 10-325 MG tablet due to quantity. Please call the prior authorization team and  ask for this to be expedited which gives 72 hour turn around time.

## 2024-01-23 NOTE — Telephone Encounter (Signed)
 Pharmacy Patient Advocate Encounter   Received notification from Pt Calls Messages that prior authorization for Hydrocodone/APAP 10/325mg  is required/requested.   Insurance verification completed.   The patient is insured through [facility] .   Per test claim: PA required; PA submitted to above mentioned insurance via Phone Key/confirmation #/EOC [id number] Status is pending   Phone# [phone]

## 2024-01-28 ENCOUNTER — Other Ambulatory Visit (HOSPITAL_COMMUNITY): Payer: Self-pay

## 2024-01-28 NOTE — Telephone Encounter (Signed)
 Pharmacy Patient Advocate Encounter  Received notification from [facility] MEDICAID that Prior Authorization for Hydrocodone/APAP 10/325mg  tabs has been APPROVED from 01/23/24 to 07/25/24   PA #/Case ID/Reference #: [id number]  Per test claim, submit bill to other processor or primary payer. Member has [employer], member has other 3rd party coverage.

## 2024-02-07 ENCOUNTER — Encounter: Payer: Self-pay | Admitting: Internal Medicine

## 2024-02-12 ENCOUNTER — Other Ambulatory Visit (HOSPITAL_COMMUNITY): Payer: Self-pay

## 2024-02-20 ENCOUNTER — Other Ambulatory Visit: Payer: Self-pay | Admitting: Internal Medicine

## 2024-02-20 NOTE — Telephone Encounter (Signed)
 Copied from CRM [id number]. Topic: Clinical - Medication Refill >> Feb 20, 2024  4:31 PM Adaysia C wrote: Most Recent Primary Care Visit:  Provider: Hillard Danker A  Department: [facility]  Visit Type: OFFICE VISIT  Date: 12/24/2023  Medication: HYDROcodone-acetaminophen (NORCO) 10-325 MG tablet  Has the patient contacted their pharmacy? No, patient initiated RX refill through provider (Agent: If no, request that the patient contact the pharmacy for the refill. If patient does not wish to contact the pharmacy document the reason why and proceed with request.) (Agent: If yes, when and what did the pharmacy advise?)  Is this the correct pharmacy for this prescription? Yes If no, delete pharmacy and type the correct one.  This is the patient's preferred pharmacy:  [facility] [address] [place] - [address] [place] [address] Phone: [phone] Fax: [phone]   Has the prescription been filled recently? Yes  Is the patient out of the medication? No, patient will be out of medication by 02/22/2024  Has the patient been seen for an appointment in the last year OR does the patient have an upcoming appointment? Yes  Can we respond through MyChart? Yes  Agent: Please be advised that Rx refills may take up to 3 business days. We ask that you follow-up with your pharmacy.

## 2024-02-22 MED ORDER — HYDROCODONE-ACETAMINOPHEN 10-325 MG PO TABS: 1.0000 | ORAL_TABLET | Freq: Every day | ORAL | 0 refills | Status: DC

## 2024-02-22 NOTE — Telephone Encounter (Signed)
 Pt called and asked if there will be another doctor covering for Dr. Okey Dupre to refill his medication since she has been out of the office. Please advise.

## 2024-03-24 ENCOUNTER — Other Ambulatory Visit: Payer: Self-pay | Admitting: Internal Medicine

## 2024-03-24 ENCOUNTER — Ambulatory Visit: Admitting: Internal Medicine

## 2024-03-24 ENCOUNTER — Encounter: Payer: Self-pay | Admitting: Internal Medicine

## 2024-03-24 VITALS — BP 140/80 | HR 112 | Temp 98.2°F | Ht 67.0 in | Wt 207.0 lb

## 2024-03-24 DIAGNOSIS — I10 Essential (primary) hypertension: Secondary | ICD-10-CM | POA: Diagnosis not present

## 2024-03-24 DIAGNOSIS — Z7985 Long-term (current) use of injectable non-insulin antidiabetic drugs: Secondary | ICD-10-CM

## 2024-03-24 DIAGNOSIS — E1169 Type 2 diabetes mellitus with other specified complication: Secondary | ICD-10-CM | POA: Diagnosis not present

## 2024-03-24 DIAGNOSIS — Z7984 Long term (current) use of oral hypoglycemic drugs: Secondary | ICD-10-CM

## 2024-03-24 DIAGNOSIS — F1120 Opioid dependence, uncomplicated: Secondary | ICD-10-CM

## 2024-03-24 DIAGNOSIS — E785 Hyperlipidemia, unspecified: Secondary | ICD-10-CM

## 2024-03-24 DIAGNOSIS — E118 Type 2 diabetes mellitus with unspecified complications: Secondary | ICD-10-CM

## 2024-03-24 LAB — COMPREHENSIVE METABOLIC PANEL WITH GFR
ALT: 11 U/L (ref 0–53)
AST: 13 U/L (ref 0–37)
Albumin: 4.4 g/dL (ref 3.5–5.2)
Alkaline Phosphatase: 56 U/L (ref 39–117)
BUN: 9 mg/dL (ref 6–23)
CO2: 28 meq/L (ref 19–32)
Calcium: 9.3 mg/dL (ref 8.4–10.5)
Chloride: 102 meq/L (ref 96–112)
Creatinine, Ser: 0.81 mg/dL (ref 0.40–1.50)
GFR: 103.02 mL/min (ref >60.00)
Glucose, Bld: 201 mg/dL — ABNORMAL HIGH (ref 70–99)
Potassium: 4.4 meq/L (ref 3.5–5.1)
Sodium: 141 meq/L (ref 135–145)
Total Bilirubin: 0.4 mg/dL (ref 0.2–1.2)
Total Protein: 7.4 g/dL (ref 6.0–8.3)

## 2024-03-24 LAB — CBC
HCT: 43.5 % (ref 39.0–52.0)
Hemoglobin: 14.7 g/dL (ref 13.0–17.0)
MCHC: 33.7 g/dL (ref 30.0–36.0)
MCV: 91 fl (ref 78.0–100.0)
Platelets: 218 10*3/uL (ref 150.0–400.0)
RBC: 4.78 Mil/uL (ref 4.22–5.81)
RDW: 13.5 % (ref 11.5–15.5)
WBC: 9.2 10*3/uL (ref 4.0–10.5)

## 2024-03-24 LAB — LIPID PANEL
Cholesterol: 175 mg/dL (ref 0–200)
HDL: 31.5 mg/dL — ABNORMAL LOW (ref >39.00)
LDL Cholesterol: 96 mg/dL (ref 0–99)
NonHDL: 143.31
Total CHOL/HDL Ratio: 6
Triglycerides: 236 mg/dL — ABNORMAL HIGH (ref 0.0–149.0)
VLDL: 47.2 mg/dL — ABNORMAL HIGH (ref 0.0–40.0)

## 2024-03-24 LAB — HEMOGLOBIN A1C: Hgb A1c MFr Bld: 6.6 % — ABNORMAL HIGH (ref 4.6–6.5)

## 2024-03-24 LAB — MICROALBUMIN / CREATININE URINE RATIO
Creatinine,U: 253.8 mg/dL
Microalb Creat Ratio: 12.5 mg/g (ref 0.0–30.0)
Microalb, Ur: 3.2 mg/dL — ABNORMAL HIGH (ref 0.0–1.9)

## 2024-03-24 MED ORDER — HYDROCODONE-ACETAMINOPHEN 10-325 MG PO TABS: 1.0000 | ORAL_TABLET | Freq: Every day | ORAL | 0 refills | Status: DC

## 2024-03-24 NOTE — Progress Notes (Signed)
   Subjective:   Patient ID: John Rhodes, male    DOB: 25-Jul-1976, 48 y.o.   MRN: [id number]  HPI The patient is a 48 YO man coming in for medical management (see A/P for details).   Review of Systems  Constitutional: Negative.   HENT: Negative.    Eyes: Negative.   Respiratory:  Negative for cough, chest tightness and shortness of breath.   Cardiovascular:  Negative for chest pain, palpitations and leg swelling.  Gastrointestinal:  Negative for abdominal distention, abdominal pain, constipation, diarrhea, nausea and vomiting.  Musculoskeletal:  Positive for back pain.  Skin: Negative.   Neurological: Negative.   Psychiatric/Behavioral: Negative.      Objective:  Physical Exam Constitutional:      Appearance: He is well-developed.  HENT:     Head: Normocephalic and atraumatic.  Cardiovascular:     Rate and Rhythm: Normal rate and regular rhythm.  Pulmonary:     Effort: Pulmonary effort is normal. No respiratory distress.     Breath sounds: Normal breath sounds. No wheezing or rales.  Abdominal:     General: Bowel sounds are normal. There is no distension.     Palpations: Abdomen is soft.     Tenderness: There is no abdominal tenderness. There is no rebound.  Musculoskeletal:        General: Tenderness present.     Cervical back: Normal range of motion.  Skin:    General: Skin is warm and dry.  Neurological:     Mental Status: He is alert and oriented to person, place, and time.     Coordination: Coordination normal.     Vitals:   03/24/24 1029  BP: (!) 140/80  Pulse: (!) 112  Temp: 98.2 F (36.8 C)  TempSrc: Oral  SpO2: 96%  Weight: 207 lb (93.9 kg)  Height: 5\' 7"  (1.702 m)    Assessment & Plan:

## 2024-03-24 NOTE — Patient Instructions (Signed)
 We will check the labs today.

## 2024-03-25 ENCOUNTER — Encounter: Payer: Self-pay | Admitting: Internal Medicine

## 2024-03-27 LAB — DRUG MONITOR, BENZO,W/CONF, URINE
Alphahydroxyalprazolam: 205 ng/mL — ABNORMAL HIGH (ref <25)
Alphahydroxymidazolam: NEGATIVE ng/mL (ref <50)
Alphahydroxytriazolam: NEGATIVE ng/mL (ref <50)
Aminoclonazepam: NEGATIVE ng/mL (ref <25)
Benzodiazepines: POSITIVE ng/mL — AB (ref <100)
Hydroxyethylflurazepam: NEGATIVE ng/mL (ref <50)
Lorazepam: NEGATIVE ng/mL (ref <50)
Nordiazepam: NEGATIVE ng/mL (ref <50)
Oxazepam: NEGATIVE ng/mL (ref <50)
Temazepam: NEGATIVE ng/mL (ref <50)

## 2024-03-27 LAB — DRUG MONITOR, COCAINEMETAB, W/CONF, URINE: Cocaine Metabolite: NEGATIVE ng/mL (ref <150)

## 2024-03-27 LAB — DRUG MONITOR, OXYCODONE,W/CONF, URINE: Oxycodone: NEGATIVE ng/mL (ref <100)

## 2024-03-27 LAB — DRUG MONITOR,AMPHETAMINE,W/CONF, URINE
Amphetamine: NEGATIVE ng/mL (ref <250)
Amphetamines: NEGATIVE ng/mL (ref <500)
Methamphetamine: NEGATIVE ng/mL (ref <250)

## 2024-03-27 LAB — PRESCRIBED DRUGS,MEDMATCH(R)

## 2024-03-27 LAB — DRUG MONITOR, OPIATES,W/CONF, URINE
Codeine: NEGATIVE ng/mL (ref <50)
Hydrocodone: 7216 ng/mL — ABNORMAL HIGH (ref <50)
Hydromorphone: 526 ng/mL — ABNORMAL HIGH (ref <50)
Morphine: NEGATIVE ng/mL (ref <50)
Norhydrocodone: 9276 ng/mL — ABNORMAL HIGH (ref <50)
Opiates: POSITIVE ng/mL — AB (ref <100)

## 2024-03-27 LAB — DRUG MONITOR,BARBITURATE,W/CONF, URINE: Barbiturates: NEGATIVE ng/mL (ref <300)

## 2024-03-27 LAB — DM TEMPLATE

## 2024-03-27 LAB — DRUG MONITOR, TRAMADOL,QN, URINE
Desmethyltramadol: NEGATIVE ng/mL (ref <100)
Tramadol: NEGATIVE ng/mL (ref <100)

## 2024-03-28 NOTE — Assessment & Plan Note (Signed)
 PDMP appropriate. Checking UDS as due. Refilled hydrocodone  10/325 taken 5 times a day #150 no refills. Continue visits every 3 months.

## 2024-03-28 NOTE — Assessment & Plan Note (Signed)
 Checking CMP and adjust as needed. BP is at goal on lisinopril /hydrochlorothiazide .

## 2024-03-28 NOTE — Assessment & Plan Note (Signed)
Checking lipid panel and adjust statin as needed.  

## 2024-03-28 NOTE — Assessment & Plan Note (Addendum)
 Checking HGa1c and CMP and UACR and lipid panel today and adjust as needed. Is doing well with glipizide  and metformin  and ozempic  currently and weight is still downtrending. Adjust as needed. On ACE-I and statin.

## 2024-03-31 ENCOUNTER — Encounter: Payer: Self-pay | Admitting: Internal Medicine

## 2024-04-17 ENCOUNTER — Other Ambulatory Visit: Payer: Self-pay | Admitting: Internal Medicine

## 2024-04-17 NOTE — Telephone Encounter (Unsigned)
 Copied from CRM [id number]. Topic: Clinical - Medication Refill >> Apr 17, 2024  4:21 PM Baldo Levan wrote: Medication: sertraline  (ZOLOFT ) 100 MG tablet  Has the patient contacted their pharmacy? Yes (Agent: If no, request that the patient contact the pharmacy for the refill. If patient does not wish to contact the pharmacy document the reason why and proceed with request.) (Agent: If yes, when and what did the pharmacy advise?)  Pharmacy stated they reached out to the office twice with no response .   This is the patient's preferred pharmacy:  [facility] [address], [place] - [address] [place] [address] Phone: [phone] Fax: [phone]    Is this the correct pharmacy for this prescription? Yes If no, delete pharmacy and type the correct one.   Has the prescription been filled recently? No  Is the patient out of the medication? Yes  Has the patient been seen for an appointment in the last year OR does the patient have an upcoming appointment? Yes  Can we respond through MyChart? Yes  Agent: Please be advised that Rx refills may take up to 3 business days. We ask that you follow-up with your pharmacy.

## 2024-04-18 MED ORDER — SERTRALINE HCL 100 MG PO TABS: 200.0000 mg | ORAL_TABLET | Freq: Every day | ORAL | 3 refills | Status: AC

## 2024-04-22 ENCOUNTER — Other Ambulatory Visit: Payer: Self-pay | Admitting: Internal Medicine

## 2024-04-22 MED ORDER — HYDROCODONE-ACETAMINOPHEN 10-325 MG PO TABS: 1.0000 | ORAL_TABLET | Freq: Every day | ORAL | 0 refills | Status: DC

## 2024-04-22 NOTE — Telephone Encounter (Signed)
 Last filled 03/24/2024 Last office visit 03/24/2024

## 2024-04-22 NOTE — Telephone Encounter (Signed)
 Copied from CRM [id number]. Topic: Clinical - Medication Refill >> Apr 22, 2024 10:45 AM John Rhodes wrote: Medication: HYDROcodone -acetaminophen  (NORCO) 10-325 MG tablet  Has the patient contacted their pharmacy? Yes (Agent: If no, request that the patient contact the pharmacy for the refill. If patient does not wish to contact the pharmacy document the reason why and proceed with request.) (Agent: If yes, when and what did the pharmacy advise?)  This is the patient's preferred pharmacy:  [facility] [address] [place] - [address] [place] [address] Phone: [phone] Fax: [phone]   Is this the correct pharmacy for this prescription? Yes If no, delete pharmacy and type the correct one.   Has the prescription been filled recently? No  Is the patient out of the medication? Yes  Has the patient been seen for an appointment in the last year OR does the patient have an upcoming appointment? Yes  Can we respond through MyChart? Yes  Agent: Please be advised that Rx refills may take up to 3 business days. We ask that you follow-up with your pharmacy.

## 2024-05-03 ENCOUNTER — Other Ambulatory Visit: Payer: Self-pay | Admitting: Internal Medicine

## 2024-05-05 ENCOUNTER — Telehealth: Payer: Self-pay | Admitting: Internal Medicine

## 2024-05-05 NOTE — Telephone Encounter (Signed)
 Copied from CRM [id number]. Topic: General - Other >> May 05, 2024 10:55 AM Baldo Levan wrote: Reason for CRM: Patient is requesting a letter stating that he is unable to work, due to his health, for his food stamps information.

## 2024-05-05 NOTE — Telephone Encounter (Unsigned)
 Copied from CRM [id number]. Topic: Clinical - Medication Refill >> May 05, 2024 10:51 AM Baldo Levan wrote: Medication: metFORMIN  metFORMIN  (GLUCOPHAGE ) 1000 MG tablet   Has the patient contacted their pharmacy? Yes- Pharmacy stated they contacted the office but nothing has been filled. (Agent: If no, request that the patient contact the pharmacy for the refill. If patient does not wish to contact the pharmacy document the reason why and proceed with request.) (Agent: If yes, when and what did the pharmacy advise?)  This is the patient's preferred pharmacy:  [facility] [address], [place] - [address] [place] [address] Phone: [phone] Fax: [phone]   Is this the correct pharmacy for this prescription? Yes If no, delete pharmacy and type the correct one.   Has the prescription been filled recently? No  Is the patient out of the medication? Yes  Has the patient been seen for an appointment in the last year OR does the patient have an upcoming appointment? Yes  Can we respond through MyChart? Yes  Agent: Please be advised that Rx refills may take up to 3 business days. We ask that you follow-up with your pharmacy.

## 2024-05-06 ENCOUNTER — Other Ambulatory Visit: Payer: Self-pay

## 2024-05-06 MED ORDER — METFORMIN HCL 1000 MG PO TABS: 1000.0000 mg | ORAL_TABLET | Freq: Two times a day (BID) | ORAL | 1 refills | Status: DC

## 2024-05-06 NOTE — Telephone Encounter (Signed)
 Sent in

## 2024-05-13 NOTE — Telephone Encounter (Signed)
 Patient is needing a letter stating they cannot work.

## 2024-05-21 ENCOUNTER — Other Ambulatory Visit: Payer: Self-pay | Admitting: Internal Medicine

## 2024-05-21 NOTE — Telephone Encounter (Signed)
 Copied from CRM [id number]. Topic: Clinical - Medication Refill >> May 21, 2024 11:36 AM Burnard DEL wrote: Medication:  HYDROcodone -acetaminophen  (NORCO) 10-325 MG tablet    Has the patient contacted their pharmacy? No (Agent: If no, request that the patient contact the pharmacy for the refill. If patient does not wish to contact the pharmacy document the reason why and proceed with request.) (Agent: If yes, when and what did the pharmacy advise?)  This is the patient's preferred pharmacy:  [facility]ORE #[id number] [place], [place] - [address] [place] [address] Phone: [phone] Fax: [phone]   Phone: [phone] Fax: [phone]  Is this the correct pharmacy for this prescription? Yes If no, delete pharmacy and type the correct one.   Has the prescription been filled recently? No  Is the patient out of the medication? No(few pills left)  Has the patient been seen for an appointment in the last year OR does the patient have an upcoming appointment? Yes  Can we respond through MyChart? Yes  Agent: Please be advised that Rx refills may take up to 3 business days. We ask that you follow-up with your pharmacy.

## 2024-05-26 MED ORDER — HYDROCODONE-ACETAMINOPHEN 10-325 MG PO TABS: 1.0000 | ORAL_TABLET | Freq: Every day | ORAL | 0 refills | Status: DC

## 2024-05-27 NOTE — Telephone Encounter (Signed)
 Letter on mychart if needing signed copy print and place in my box thanks.

## 2024-06-01 ENCOUNTER — Other Ambulatory Visit: Payer: Self-pay | Admitting: Internal Medicine

## 2024-06-15 ENCOUNTER — Other Ambulatory Visit: Payer: Self-pay | Admitting: Internal Medicine

## 2024-06-16 NOTE — Telephone Encounter (Signed)
 LOV: 03/24/24 Last fill: 12/20/23, 75 tablet 5 refills

## 2024-06-18 ENCOUNTER — Other Ambulatory Visit (HOSPITAL_COMMUNITY): Payer: Self-pay

## 2024-06-18 ENCOUNTER — Telehealth: Payer: Self-pay

## 2024-06-18 NOTE — Telephone Encounter (Signed)
 Pharmacy Patient Advocate Encounter   Received notification from CoverMyMeds that prior authorization for Ozempic  (0.25 or 0.5 MG/DOSE) 2MG /3ML pen-injectors  is due for renewal.   Insurance verification completed.   The patient is insured through [facility].  Action: The current 30 day co-pay is, $4.00 is too soon.  No PA needed at this time. This test claim was processed through [facility]- copay amounts may vary at other pharmacies due to pharmacy/plan contracts, or as the patient moves through the different stages of their insurance plan.

## 2024-06-21 ENCOUNTER — Other Ambulatory Visit: Payer: Self-pay | Admitting: Internal Medicine

## 2024-06-23 ENCOUNTER — Ambulatory Visit: Admitting: Internal Medicine

## 2024-06-25 ENCOUNTER — Telehealth: Payer: Self-pay

## 2024-06-25 ENCOUNTER — Other Ambulatory Visit (HOSPITAL_COMMUNITY): Payer: Self-pay

## 2024-06-25 ENCOUNTER — Ambulatory Visit: Admitting: Internal Medicine

## 2024-06-25 ENCOUNTER — Encounter: Payer: Self-pay | Admitting: Internal Medicine

## 2024-06-25 VITALS — BP 112/80 | HR 104 | Temp 98.3°F | Ht 67.0 in | Wt 216.0 lb

## 2024-06-25 DIAGNOSIS — F1120 Opioid dependence, uncomplicated: Secondary | ICD-10-CM | POA: Diagnosis not present

## 2024-06-25 DIAGNOSIS — Z7985 Long-term (current) use of injectable non-insulin antidiabetic drugs: Secondary | ICD-10-CM

## 2024-06-25 DIAGNOSIS — E118 Type 2 diabetes mellitus with unspecified complications: Secondary | ICD-10-CM

## 2024-06-25 MED ORDER — HYDROCODONE-ACETAMINOPHEN 10-325 MG PO TABS: 1.0000 | ORAL_TABLET | Freq: Every day | ORAL | 0 refills | Status: DC

## 2024-06-25 MED ORDER — TIRZEPATIDE 10 MG/0.5ML ~~LOC~~ SOAJ: 10.0000 mg | SUBCUTANEOUS | 0 refills | Status: DC

## 2024-06-25 NOTE — Telephone Encounter (Signed)
 Pharmacy Patient Advocate Encounter  Received notification from [employer] that Prior Authorization for Mounjaro  10 has been DENIED.  Full denial letter will be uploaded to the media tab. See denial reason below.     PA #/Case ID/Reference #: [id number]

## 2024-06-25 NOTE — Telephone Encounter (Signed)
 Pharmacy Patient Advocate Encounter   Received notification from Onbase that prior authorization for Mounjaro  10 is required/requested.   Insurance verification completed.   The patient is insured through [facility] .   Per test claim: PA required; PA submitted to above mentioned insurance via CoverMyMeds Key/confirmation #/EOC [id number] Status is pending

## 2024-06-25 NOTE — Patient Instructions (Signed)
 We will change ozempic  to mounjaro  to help. We will start with 10 mg daily and if needed can go up every month.

## 2024-06-25 NOTE — Progress Notes (Unsigned)
   Subjective:   Patient ID: John Rhodes, male    DOB: 1975-12-15, 48 y.o.   MRN: [id number]  The patient is a 48 YO man coming in for medical management (see A/P).   Review of Systems  Constitutional: Negative.  Negative for appetite change, fatigue, fever and unexpected weight change.  HENT: Negative.    Eyes: Negative.   Respiratory: Negative.  Negative for cough, chest tightness and shortness of breath.   Cardiovascular: Negative.  Negative for chest pain, palpitations and leg swelling.  Gastrointestinal:  Negative for abdominal distention, abdominal pain, constipation, diarrhea, nausea and vomiting.  Musculoskeletal:  Positive for back pain and myalgias. Negative for arthralgias.  Skin: Negative.   Neurological: Negative.  Negative for syncope, weakness and numbness.  Psychiatric/Behavioral: Negative.      Objective:  Physical Exam Constitutional:      Appearance: He is well-developed.  HENT:     Head: Normocephalic and atraumatic.  Cardiovascular:     Rate and Rhythm: Normal rate and regular rhythm.  Pulmonary:     Effort: Pulmonary effort is normal. No respiratory distress.     Breath sounds: Normal breath sounds. No wheezing or rales.  Abdominal:     General: Bowel sounds are normal. There is no distension.     Palpations: Abdomen is soft.     Tenderness: There is no abdominal tenderness. There is no rebound.  Musculoskeletal:     Cervical back: Normal range of motion.  Skin:    General: Skin is warm and dry.  Neurological:     Mental Status: He is alert and oriented to person, place, and time.     Coordination: Coordination normal.     Vitals:   06/25/24 1036  BP: 112/80  Pulse: (!) 104  Temp: 98.3 F (36.8 C)  TempSrc: Oral  SpO2: 99%  Weight: 216 lb (98 kg)  Height: 5' 7 (1.702 m)    Assessment and Plan Assessment & Plan

## 2024-06-26 NOTE — Assessment & Plan Note (Signed)
 Will check HGA1c next time as we are changing to mounjaro  from ozempic  due to rising sugars at home and rising weight and he feels no effect any longer. Start 10 mg weekly then 12.5 mg weekly month 2 then 15 mg weekly month 3 and onwards. Follow up 3 months.

## 2024-06-26 NOTE — Assessment & Plan Note (Signed)
 PDMP review up to date. Rx hydrocodone  done 10/325 #150 per month. Follow up 3 months. UDS up to date and pain contract on file. Stable control and no adverse effects.

## 2024-07-01 ENCOUNTER — Other Ambulatory Visit: Payer: Self-pay | Admitting: Internal Medicine

## 2024-07-01 DIAGNOSIS — E1165 Type 2 diabetes mellitus with hyperglycemia: Secondary | ICD-10-CM

## 2024-07-02 ENCOUNTER — Telehealth: Payer: Self-pay

## 2024-07-02 MED ORDER — SEMAGLUTIDE (2 MG/DOSE) 8 MG/3ML ~~LOC~~ SOPN
2.0000 mg | PEN_INJECTOR | SUBCUTANEOUS | 3 refills | Status: AC
Start: 2024-07-02 — End: ?

## 2024-07-02 NOTE — Telephone Encounter (Signed)
**Note De-identified  [place] Obfuscation** Please advise 

## 2024-07-02 NOTE — Telephone Encounter (Signed)
 Sent in ozempic 

## 2024-07-02 NOTE — Telephone Encounter (Signed)
 Copied from CRM [id number]. Topic: Clinical - Medication Prior Auth >> Jul 01, 2024  4:37 PM John Rhodes wrote: Reason for CRM: Patient is calling in regarding the monjuro, patient stated he is okay going back to the ozempic, since it was denied.

## 2024-07-23 ENCOUNTER — Other Ambulatory Visit: Payer: Self-pay | Admitting: Internal Medicine

## 2024-07-23 NOTE — Telephone Encounter (Unsigned)
 Copied from CRM #[id number]. Topic: Clinical - Medication Refill >> Jul 23, 2024 10:36 AM Precious C wrote: Medication: HYDROcodone -acetaminophen  (NORCO) 10-325 MG tablet  Has the patient contacted their pharmacy? No (Agent: If no, request that the patient contact the pharmacy for the refill. If patient does not wish to contact the pharmacy document the reason why and proceed with request.) (Agent: If yes, when and what did the pharmacy advise?)  This is the patient's preferred pharmacy:  [facility] #[id number] [place], [place] - [address] AT [facility] & [address] [place] [address] Phone: [phone] Fax: [phone] Hours: Not open 24 hours   Is this the correct pharmacy for this prescription? Yes If no, delete pharmacy and type the correct one.   Has the prescription been filled recently? No  Is the patient out of the medication? No  Has the patient been seen for an appointment in the last year OR does the patient have an upcoming appointment? Yes  Can we respond through MyChart? Yes  Agent: Please be advised that Rx refills may take up to 3 business days. We ask that you follow-up with your pharmacy.

## 2024-07-24 ENCOUNTER — Other Ambulatory Visit: Payer: Self-pay | Admitting: Internal Medicine

## 2024-07-24 MED ORDER — HYDROCODONE-ACETAMINOPHEN 10-325 MG PO TABS: 1.0000 | ORAL_TABLET | Freq: Every day | ORAL | 0 refills | Status: DC

## 2024-08-12 ENCOUNTER — Other Ambulatory Visit: Payer: Self-pay | Admitting: Internal Medicine

## 2024-08-16 ENCOUNTER — Other Ambulatory Visit: Payer: Self-pay | Admitting: Internal Medicine

## 2024-08-19 ENCOUNTER — Telehealth: Payer: Self-pay

## 2024-08-19 ENCOUNTER — Other Ambulatory Visit (HOSPITAL_COMMUNITY): Payer: Self-pay

## 2024-08-19 NOTE — Telephone Encounter (Signed)
 Pharmacy Patient Advocate Encounter  Received notification from [facility] MEDICAID that Prior Authorization for Ozempic  (2 MG/DOSE) 8MG /3ML pen-injectors  has been APPROVED from 08/19/24 to 08/19/25. Ran test claim, Copay is $4. This test claim was processed through [facility]- copay amounts may vary at other pharmacies due to pharmacy/plan contracts, or as the patient moves through the different stages of their insurance plan.   PA #/Case ID/Reference #: [id number]  *spoke with [facility] to process

## 2024-08-19 NOTE — Telephone Encounter (Signed)
 Pharmacy Patient Advocate Encounter   Received notification from Onbase that prior authorization for Ozempic  (2 MG/DOSE) 8MG /3ML pen-injectors  is required/requested.   Insurance verification completed.   The patient is insured through [facility] MEDICAID .   Per test claim: PA required; PA submitted to above mentioned insurance via Latent Key/confirmation #/EOC [id number] Status is pending

## 2024-08-20 ENCOUNTER — Other Ambulatory Visit: Payer: Self-pay | Admitting: Internal Medicine

## 2024-08-20 NOTE — Telephone Encounter (Signed)
 Are you able to send in as Md is out of office

## 2024-08-20 NOTE — Telephone Encounter (Signed)
 This can wait for prescriber to come back to the office

## 2024-08-20 NOTE — Telephone Encounter (Unsigned)
 Copied from CRM [id number]. Topic: Clinical - Medication Refill >> Aug 20, 2024  1:35 PM Robinson H wrote: Medication: HYDROcodone -acetaminophen  (NORCO) 10-325 MG tablet  Has the patient contacted their pharmacy? No provider has to approve (Agent: If no, request that the patient contact the pharmacy for the refill. If patient does not wish to contact the pharmacy document the reason why and proceed with request.) (Agent: If yes, when and what did the pharmacy advise?)  This is the patient's preferred pharmacy:   [facility] DRUG STORE #[id number] [place], [place] - [address] AT [facility] [address] [phone] [address] [place] [address]    Is this the correct pharmacy for this prescription? Yes If no, delete pharmacy and type the correct one.   Has the prescription been filled recently? No  Is the patient out of the medication? No  Has the patient been seen for an appointment in the last year OR does the patient have an upcoming appointment? Yes  Can we respond through MyChart? Yes  Agent: Please be advised that Rx refills may take up to 3 business days. We ask that you follow-up with your pharmacy.

## 2024-08-21 MED ORDER — HYDROCODONE-ACETAMINOPHEN 10-325 MG PO TABS: 1.0000 | ORAL_TABLET | Freq: Every day | ORAL | 0 refills | Status: DC

## 2024-08-22 ENCOUNTER — Telehealth: Payer: Self-pay

## 2024-08-22 ENCOUNTER — Other Ambulatory Visit: Payer: Self-pay | Admitting: Internal Medicine

## 2024-08-22 ENCOUNTER — Other Ambulatory Visit (HOSPITAL_COMMUNITY): Payer: Self-pay

## 2024-08-22 NOTE — Telephone Encounter (Signed)
 Copied from CRM [id number]. Topic: Clinical - Medication Question >> Aug 22, 2024 10:01 AM Alfonso HERO wrote: Reason for CRM: pharmacy needing a PA for HYDROcodone -acetaminophen  (NORCO) 10-325 MG tablet.

## 2024-08-22 NOTE — Telephone Encounter (Signed)
 Pharmacy Patient Advocate Encounter   Received notification from Pt Calls Messages that prior authorization for Hydrocodone /APAP 10/325mg  tabs is required/requested.   Insurance verification completed.   The patient is insured through [facility] MEDICAID.   Per test claim: PA required; PA submitted to above mentioned insurance via Latent Key/confirmation #/EOC [id number] Status is pending

## 2024-08-25 ENCOUNTER — Other Ambulatory Visit (HOSPITAL_COMMUNITY): Payer: Self-pay

## 2024-08-25 NOTE — Telephone Encounter (Signed)
 Pharmacy Patient Advocate Encounter  Received notification from [facility] MEDICAID that Prior Authorization for Hydrocodone /APAP 10/325mg  has been APPROVED from 08/23/24 to 02/20/25   PA #/Case ID/Reference #: [id number]

## 2024-08-28 ENCOUNTER — Other Ambulatory Visit: Payer: Self-pay | Admitting: Internal Medicine

## 2024-09-22 ENCOUNTER — Other Ambulatory Visit (HOSPITAL_COMMUNITY): Payer: Self-pay

## 2024-09-22 ENCOUNTER — Encounter: Payer: Self-pay | Admitting: Internal Medicine

## 2024-09-22 ENCOUNTER — Ambulatory Visit (INDEPENDENT_AMBULATORY_CARE_PROVIDER_SITE_OTHER): Admitting: Internal Medicine

## 2024-09-22 VITALS — BP 138/84 | HR 101 | Temp 98.4°F | Ht 67.0 in | Wt 218.2 lb

## 2024-09-22 DIAGNOSIS — F1120 Opioid dependence, uncomplicated: Secondary | ICD-10-CM | POA: Diagnosis not present

## 2024-09-22 DIAGNOSIS — M549 Dorsalgia, unspecified: Secondary | ICD-10-CM | POA: Diagnosis not present

## 2024-09-22 DIAGNOSIS — Z6834 Body mass index (BMI) 34.0-34.9, adult: Secondary | ICD-10-CM

## 2024-09-22 DIAGNOSIS — L97411 Non-pressure chronic ulcer of right heel and midfoot limited to breakdown of skin: Secondary | ICD-10-CM

## 2024-09-22 DIAGNOSIS — E11621 Type 2 diabetes mellitus with foot ulcer: Secondary | ICD-10-CM

## 2024-09-22 DIAGNOSIS — G8929 Other chronic pain: Secondary | ICD-10-CM

## 2024-09-22 DIAGNOSIS — E66811 Obesity, class 1: Secondary | ICD-10-CM

## 2024-09-22 MED ORDER — HYDROCODONE-ACETAMINOPHEN 10-325 MG PO TABS: 1.0000 | ORAL_TABLET | Freq: Every day | ORAL | 0 refills | Status: DC

## 2024-09-22 NOTE — Patient Instructions (Addendum)
 We will check the labs next time.

## 2024-09-22 NOTE — Progress Notes (Unsigned)
   Subjective:   Patient ID: John Rhodes, male    DOB: Feb 24, 1976, 48 y.o.   MRN: [id number]  Discussed the use of AI scribe software for clinical note transcription with the patient, who gave verbal consent to proceed.  History of Present Illness John Rhodes is a 48 year old male who presents with a painful blister on his foot.  He has a painful blister on his foot, which he attributes to wearing shoes without socks. The shoes were inexpensive and became wet, contributing to the blister formation. He did not notice the blister until it was pointed out to him, and it is now causing discomfort. No new numbness, tingling, or burning sensations in his feet.  He has experienced nausea related to his medication, which he has not taken for two weeks due to it being out of stock. He is concerned about feeling unwell for an upcoming cookout.  He has lost 60 pounds over the past year and a half, but his back pain has worsened since the weight loss. He wants to be under 200 pounds, as he is currently close to that weight.  His blood pressure is usually stable. No new chest pain, tightness, pressure, or breathing difficulties.  Review of Systems  Objective:  Physical Exam  Vitals:   09/22/24 0922  BP: (!) 140/90  Pulse: (!) 101  Temp: 98.4 F (36.9 C)  TempSrc: Oral  SpO2: 98%  Weight: 218 lb 3.2 oz (99 kg)  Height: 5' 7 (1.702 m)    Assessment and Plan Assessment & Plan Foot blister   The blister is likely due to shoe friction, with no signs of infection. Advise wearing socks with shoes and consider using protective padding or bandages.  Obesity   He gained two pounds but has lost sixty pounds over eighteen months. The goal is to weigh under 200 pounds. Continue the current diet and exercise regimen. Encourage regular weight monitoring and discuss further weight loss strategies if desired.  Chronic back pain   Pain continues despite weight loss, possibly due to posture and  muscle adjustment issues. Consider physical therapy and evaluate other causes if pain persists. Recommend over-the-counter pain relief as needed.

## 2024-09-26 ENCOUNTER — Other Ambulatory Visit: Payer: Self-pay | Admitting: Internal Medicine

## 2024-09-26 DIAGNOSIS — E11621 Type 2 diabetes mellitus with foot ulcer: Secondary | ICD-10-CM | POA: Insufficient documentation

## 2024-09-26 NOTE — Telephone Encounter (Signed)
 Copied from CRM [id number]. Topic: Clinical - Medication Refill >> Sep 26, 2024 11:26 AM Viola F wrote: Medication: traZODone  (DESYREL ) 100 MG tablet [[id number]]  Has the patient contacted their pharmacy? Yes (Agent: If no, request that the patient contact the pharmacy for the refill. If patient does not wish to contact the pharmacy document the reason why and proceed with request.) (Agent: If yes, when and what did the pharmacy advise?)  This is the patient's preferred pharmacy:   [facility] [address] [place] - [address] [place] [address] Phone: [phone] Fax: [phone]  Is this the correct pharmacy for this prescription? Yes If no, delete pharmacy and type the correct one.   Has the prescription been filled recently? Yes  Is the patient out of the medication? Yes, by tonight  Has the patient been seen for an appointment in the last year OR does the patient have an upcoming appointment? Yes  Can we respond through MyChart? Yes  Agent: Please be advised that Rx refills may take up to 3 business days. We ask that you follow-up with your pharmacy.

## 2024-09-26 NOTE — Assessment & Plan Note (Signed)
 Pain continues despite weight loss, possibly due to posture and muscle adjustment issues. Consider physical therapy and evaluate other causes if pain persists. Continue hydrocodone  per our pain contract. PDMP reviewed and appropriate. UDS recent and appropriate.

## 2024-09-26 NOTE — Assessment & Plan Note (Signed)
 He gained two pounds but has lost sixty pounds over eighteen months. The goal is to weigh under 200 pounds. Continue the current diet and exercise regimen. Encourage regular weight monitoring and discuss further weight loss strategies if desired.

## 2024-09-26 NOTE — Assessment & Plan Note (Signed)
 The blister is likely due to shoe friction, with no signs of infection. Advise wearing socks with shoes and consider using protective padding or bandages.

## 2024-10-07 ENCOUNTER — Other Ambulatory Visit: Payer: Self-pay | Admitting: Internal Medicine

## 2024-10-07 NOTE — Telephone Encounter (Unsigned)
 Copied from CRM [id number]. Topic: Clinical - Medication Refill >> Oct 07, 2024 10:53 AM Rea ORN wrote: Medication: gabapentin  (NEURONTIN ) 300 MG capsule  Has the patient contacted their pharmacy? Yes (Agent: If no, request that the patient contact the pharmacy for the refill. If patient does not wish to contact the pharmacy document the reason why and proceed with request.) (Agent: If yes, when and what did the pharmacy advise?)  This is the patient's preferred pharmacy:  [facility] [address] [place] - [address] [place] [address] Phone: [phone] Fax: [phone]  Is this the correct pharmacy for this prescription? Yes If no, delete pharmacy and type the correct one.   Has the prescription been filled recently? No  Is the patient out of the medication? Yes  Has the patient been seen for an appointment in the last year OR does the patient have an upcoming appointment? Yes  Can we respond through MyChart? Yes  Agent: Please be advised that Rx refills may take up to 3 business days. We ask that you follow-up with your pharmacy.

## 2024-10-09 MED ORDER — GABAPENTIN 300 MG PO CAPS: ORAL_CAPSULE | ORAL | 1 refills | Status: DC

## 2024-10-21 ENCOUNTER — Other Ambulatory Visit: Payer: Self-pay | Admitting: Internal Medicine

## 2024-10-21 NOTE — Telephone Encounter (Unsigned)
 Copied from CRM [id number]. Topic: Clinical - Medication Refill >> Oct 21, 2024  7:57 AM Mia F wrote: Medication: HYDROcodone -acetaminophen  (NORCO) 10-325 MG tablet   Has the patient contacted their pharmacy? Yes (Agent: If no, request that the patient contact the pharmacy for the refill. If patient does not wish to contact the pharmacy document the reason why and proceed with request.) (Agent: If yes, when and what did the pharmacy advise?)  This is the patient's preferred pharmacy:   [facility] [address], [place] - [address] [place] [address] Phone: [phone] Fax: [phone]  Is this the correct pharmacy for this prescription? Yes If no, delete pharmacy and type the correct one.   Has the prescription been filled recently? Yes  Is the patient out of the medication? Yes  Has the patient been seen for an appointment in the last year OR does the patient have an upcoming appointment? Yes  Can we respond through MyChart? Yes  Agent: Please be advised that Rx refills may take up to 3 business days. We ask that you follow-up with your pharmacy.

## 2024-10-22 MED ORDER — HYDROCODONE-ACETAMINOPHEN 10-325 MG PO TABS: 1.0000 | ORAL_TABLET | Freq: Every day | ORAL | 0 refills | Status: DC

## 2024-10-30 ENCOUNTER — Other Ambulatory Visit: Payer: Self-pay | Admitting: Internal Medicine

## 2024-11-14 ENCOUNTER — Other Ambulatory Visit: Payer: Self-pay | Admitting: Internal Medicine

## 2024-11-19 ENCOUNTER — Other Ambulatory Visit: Payer: Self-pay | Admitting: Internal Medicine

## 2024-11-19 NOTE — Telephone Encounter (Signed)
 Copied from CRM #[id number]. Topic: Clinical - Medication Refill >> Nov 19, 2024  2:50 PM Leah W wrote: Medication:  HYDROcodone -acetaminophen  (NORCO) 10-325 MG tablet    Has the patient contacted their pharmacy? Yes (Agent: If no, request that the patient contact the pharmacy for the refill. If patient does not wish to contact the pharmacy document the reason why and proceed with request.) (Agent: If yes, when and what did the pharmacy advise?)  This is the patient's preferred pharmacy:   [id number] [place], [place] - [address] [place] AT [address] [place]. [place] [address] Phone: [phone] Fax: [phone]  Is this the correct pharmacy for this prescription? Yes If no, delete pharmacy and type the correct one.   Has the prescription been filled recently? Yes  Is the patient out of the medication? No  Has the patient been seen for an appointment in the last year OR does the patient have an upcoming appointment? Yes  Can we respond through MyChart? Yes  Agent: Please be advised that Rx refills may take up to 3 business days. We ask that you follow-up with your pharmacy.

## 2024-11-24 MED ORDER — HYDROCODONE-ACETAMINOPHEN 10-325 MG PO TABS: 1.0000 | ORAL_TABLET | Freq: Every day | ORAL | 0 refills | Status: DC

## 2024-12-10 ENCOUNTER — Other Ambulatory Visit: Payer: Self-pay | Admitting: Internal Medicine

## 2024-12-18 ENCOUNTER — Telehealth: Payer: Self-pay

## 2024-12-18 NOTE — Telephone Encounter (Signed)
 Copied from CRM [id number]. Topic: Appointments - Scheduling Inquiry for Clinic >> Dec 18, 2024  4:26 PM Burnard DEL wrote: Reason for CRM: Patient would like to know with the upcoming weather and him having to find a ride ,could his appointment on 12/24/2024 be virtual. Please advise

## 2024-12-19 NOTE — Telephone Encounter (Signed)
Fine to change to virtual.

## 2024-12-23 NOTE — Telephone Encounter (Signed)
 Pt's visit changed to virtual.

## 2024-12-24 ENCOUNTER — Telehealth: Admitting: Internal Medicine

## 2024-12-24 ENCOUNTER — Ambulatory Visit: Admitting: Internal Medicine

## 2024-12-24 DIAGNOSIS — E118 Type 2 diabetes mellitus with unspecified complications: Secondary | ICD-10-CM

## 2024-12-24 DIAGNOSIS — F1120 Opioid dependence, uncomplicated: Secondary | ICD-10-CM

## 2024-12-24 MED ORDER — HYDROCODONE-ACETAMINOPHEN 10-325 MG PO TABS: 1.0000 | ORAL_TABLET | Freq: Every day | ORAL | 0 refills | Status: AC

## 2024-12-24 NOTE — Progress Notes (Unsigned)
 Virtual Visit via Video Note  I connected with John Rhodes on 12/24/24 at 10:40 AM EST by a video enabled telemedicine application and verified that I am speaking with the correct person using two identifiers.  The patient and the provider were at separate locations throughout the entire encounter. Patient location: home, Provider location: work   I discussed the limitations of evaluation and management by telemedicine and the availability of in person appointments. The patient expressed understanding and agreed to proceed. The patient and the provider were the only parties present for the visit unless noted in HPI below.  History of Present Illness: Discussed the use of AI scribe software for clinical note transcription with the patient, who gave verbal consent to proceed.  History of Present Illness John Rhodes is a 49 year old male with diabetes who presents for a routine follow-up.  He experienced a fall yesterday in the snow while attempting to free his car, resulting in back pain. His back was already painful prior to the fall, and he does not believe the fall exacerbated his condition. The snow was soft, which lessened the impact of the fall. No new injuries aside from this fall are reported.  He has not taken Ozempic  in November and December due to the holidays to avoid nausea. He has had his feet checked and reports no open sores or wounds. No new stomach troubles, chest pains, or breathing problems are noted.  He sleeps well once asleep but has difficulty falling asleep initially, often watching TV when unable to sleep.  He prefers having his medications sent to [facility] in [place], as other pharmacies frequently run out of Ozempic . He expresses frustration with medication availability and potential access issues without a vehicle.  Observations/Objective: Appearance: normal, breathing appears normal, pain in lower back, casual grooming, abdomen does not appear distended,  throat normal, memory normal, mental status is A and O times 3  Assessment and Plan Assessment & Plan Type 2 diabetes mellitus with complication   Diabetes is well-managed with no new complications. He did not take Ozempic  in November and December due to nausea concerns. Continue the current diabetes management plan and recheck diabetes status at the next visit.  Chronic back pain with opioid dependence   Chronic back pain persists and was exacerbated by a recent fall. Pain is managed with hydrocodone . A prescription for hydrocodone  was sent to [facility]. Discussed potential for mail order pharmacy services given his transportation concerns.[place] PDMP reviewed and appropriate. Last UDS within 1 year and appropriate. Pain contract on file.    I discussed the assessment and treatment plan with the patient. The patient was provided an opportunity to ask questions and all were answered. The patient agreed with the plan and demonstrated an understanding of the instructions.   The patient was advised to call back or seek an in-person evaluation if the symptoms worsen or if the condition fails to improve as anticipated.  Almarie DELENA Cleveland, MD

## 2024-12-25 ENCOUNTER — Other Ambulatory Visit: Payer: Self-pay | Admitting: Internal Medicine

## 2024-12-26 ENCOUNTER — Encounter: Payer: Self-pay | Admitting: Internal Medicine
